# Patient Record
Sex: Female | Born: 1960 | Race: White | Hispanic: No | Smoking: Current every day smoker
Health system: Southern US, Community
[De-identification: ages and names within clinical notes are randomized; demographics above are authoritative.]

## PROBLEM LIST (undated history)

## (undated) DIAGNOSIS — F419 Anxiety disorder, unspecified: Secondary | ICD-10-CM

## (undated) DIAGNOSIS — T7840XA Allergy, unspecified, initial encounter: Secondary | ICD-10-CM

## (undated) DIAGNOSIS — M1990 Unspecified osteoarthritis, unspecified site: Secondary | ICD-10-CM

## (undated) DIAGNOSIS — N200 Calculus of kidney: Secondary | ICD-10-CM

## (undated) DIAGNOSIS — H409 Unspecified glaucoma: Secondary | ICD-10-CM

## (undated) DIAGNOSIS — F410 Panic disorder [episodic paroxysmal anxiety] without agoraphobia: Secondary | ICD-10-CM

## (undated) DIAGNOSIS — K219 Gastro-esophageal reflux disease without esophagitis: Secondary | ICD-10-CM

## (undated) DIAGNOSIS — F32A Depression, unspecified: Secondary | ICD-10-CM

## (undated) DIAGNOSIS — F329 Major depressive disorder, single episode, unspecified: Secondary | ICD-10-CM

## (undated) DIAGNOSIS — G43909 Migraine, unspecified, not intractable, without status migrainosus: Secondary | ICD-10-CM

## (undated) DIAGNOSIS — R011 Cardiac murmur, unspecified: Secondary | ICD-10-CM

## (undated) HISTORY — DX: Major depressive disorder, single episode, unspecified: F32.9

## (undated) HISTORY — DX: Gastro-esophageal reflux disease without esophagitis: K21.9

## (undated) HISTORY — DX: Allergy, unspecified, initial encounter: T78.40XA

## (undated) HISTORY — DX: Panic disorder (episodic paroxysmal anxiety): F41.0

## (undated) HISTORY — PX: FRACTURE SURGERY: SHX138

## (undated) HISTORY — DX: Unspecified glaucoma: H40.9

## (undated) HISTORY — PX: ABDOMINAL HYSTERECTOMY: SHX81

## (undated) HISTORY — DX: Depression, unspecified: F32.A

## (undated) HISTORY — DX: Unspecified osteoarthritis, unspecified site: M19.90

## (undated) HISTORY — DX: Cardiac murmur, unspecified: R01.1

## (undated) HISTORY — PX: TOTAL ABDOMINAL HYSTERECTOMY W/ BILATERAL SALPINGOOPHORECTOMY: SHX83

## (undated) HISTORY — PX: TONSILLECTOMY: SUR1361

---

## 2004-01-09 ENCOUNTER — Emergency Department: Payer: Self-pay | Admitting: Emergency Medicine

## 2004-09-21 ENCOUNTER — Emergency Department: Payer: Self-pay | Admitting: Emergency Medicine

## 2004-09-25 ENCOUNTER — Ambulatory Visit: Payer: Self-pay | Admitting: Internal Medicine

## 2004-09-26 ENCOUNTER — Ambulatory Visit: Payer: Self-pay | Admitting: Internal Medicine

## 2005-01-17 ENCOUNTER — Emergency Department: Payer: Self-pay | Admitting: Emergency Medicine

## 2005-02-20 ENCOUNTER — Emergency Department: Payer: Self-pay | Admitting: Internal Medicine

## 2005-03-02 HISTORY — PX: SEPTOPLASTY: SUR1290

## 2005-06-12 ENCOUNTER — Emergency Department: Payer: Self-pay | Admitting: Emergency Medicine

## 2005-07-21 ENCOUNTER — Ambulatory Visit: Payer: Self-pay | Admitting: Otolaryngology

## 2006-03-05 ENCOUNTER — Emergency Department: Payer: Self-pay | Admitting: Unknown Physician Specialty

## 2006-05-05 ENCOUNTER — Emergency Department: Payer: Self-pay | Admitting: Emergency Medicine

## 2006-05-09 ENCOUNTER — Emergency Department: Payer: Self-pay | Admitting: Emergency Medicine

## 2006-07-13 ENCOUNTER — Other Ambulatory Visit: Payer: Self-pay

## 2006-07-13 ENCOUNTER — Emergency Department: Payer: Self-pay | Admitting: Emergency Medicine

## 2006-07-24 ENCOUNTER — Inpatient Hospital Stay: Payer: Self-pay | Admitting: Internal Medicine

## 2006-07-24 ENCOUNTER — Other Ambulatory Visit: Payer: Self-pay

## 2006-07-29 ENCOUNTER — Emergency Department (HOSPITAL_COMMUNITY): Admission: EM | Admit: 2006-07-29 | Discharge: 2006-07-29 | Payer: Self-pay | Admitting: Family Medicine

## 2006-08-17 ENCOUNTER — Emergency Department (HOSPITAL_COMMUNITY): Admission: EM | Admit: 2006-08-17 | Discharge: 2006-08-17 | Payer: Self-pay | Admitting: Emergency Medicine

## 2006-09-04 ENCOUNTER — Emergency Department: Payer: Self-pay

## 2006-09-05 ENCOUNTER — Emergency Department (HOSPITAL_COMMUNITY): Admission: EM | Admit: 2006-09-05 | Discharge: 2006-09-06 | Payer: Self-pay | Admitting: Emergency Medicine

## 2006-10-17 ENCOUNTER — Emergency Department: Payer: Self-pay | Admitting: Emergency Medicine

## 2006-10-23 ENCOUNTER — Emergency Department: Payer: Self-pay | Admitting: Emergency Medicine

## 2006-12-10 ENCOUNTER — Emergency Department: Payer: Self-pay | Admitting: Emergency Medicine

## 2006-12-11 ENCOUNTER — Other Ambulatory Visit: Payer: Self-pay

## 2006-12-11 ENCOUNTER — Emergency Department: Payer: Self-pay

## 2006-12-12 ENCOUNTER — Emergency Department: Payer: Self-pay

## 2007-01-08 ENCOUNTER — Emergency Department (HOSPITAL_COMMUNITY): Admission: EM | Admit: 2007-01-08 | Discharge: 2007-01-08 | Payer: Self-pay | Admitting: Emergency Medicine

## 2007-02-08 ENCOUNTER — Emergency Department (HOSPITAL_COMMUNITY): Admission: EM | Admit: 2007-02-08 | Discharge: 2007-02-08 | Payer: Self-pay | Admitting: Emergency Medicine

## 2007-02-27 ENCOUNTER — Emergency Department (HOSPITAL_COMMUNITY): Admission: EM | Admit: 2007-02-27 | Discharge: 2007-02-27 | Payer: Self-pay | Admitting: Emergency Medicine

## 2007-08-31 ENCOUNTER — Emergency Department: Payer: Self-pay | Admitting: Emergency Medicine

## 2007-11-07 ENCOUNTER — Other Ambulatory Visit: Payer: Self-pay

## 2007-11-07 ENCOUNTER — Inpatient Hospital Stay: Payer: Self-pay | Admitting: Internal Medicine

## 2007-11-08 ENCOUNTER — Other Ambulatory Visit: Payer: Self-pay

## 2008-01-02 ENCOUNTER — Emergency Department: Payer: Self-pay | Admitting: Emergency Medicine

## 2008-01-09 ENCOUNTER — Emergency Department: Payer: Self-pay | Admitting: Emergency Medicine

## 2008-01-11 ENCOUNTER — Emergency Department: Payer: Self-pay | Admitting: Emergency Medicine

## 2008-01-17 ENCOUNTER — Emergency Department: Payer: Self-pay | Admitting: Emergency Medicine

## 2008-02-08 ENCOUNTER — Emergency Department: Payer: Self-pay | Admitting: Emergency Medicine

## 2008-12-06 ENCOUNTER — Emergency Department: Payer: Self-pay | Admitting: Emergency Medicine

## 2009-03-02 HISTORY — PX: JOINT REPLACEMENT: SHX530

## 2009-03-02 HISTORY — PX: REVISION TOTAL HIP ARTHROPLASTY: SHX766

## 2009-06-19 ENCOUNTER — Emergency Department: Payer: Self-pay | Admitting: Emergency Medicine

## 2010-03-01 ENCOUNTER — Inpatient Hospital Stay: Payer: Self-pay | Admitting: Specialist

## 2010-10-24 ENCOUNTER — Emergency Department: Payer: Self-pay | Admitting: Emergency Medicine

## 2012-01-09 ENCOUNTER — Emergency Department: Payer: Self-pay | Admitting: Emergency Medicine

## 2012-04-24 ENCOUNTER — Emergency Department: Payer: Self-pay | Admitting: Emergency Medicine

## 2012-09-01 ENCOUNTER — Emergency Department: Payer: Self-pay

## 2012-12-03 ENCOUNTER — Emergency Department: Payer: Self-pay | Admitting: Emergency Medicine

## 2013-05-27 ENCOUNTER — Emergency Department: Payer: Self-pay | Admitting: Emergency Medicine

## 2015-02-04 ENCOUNTER — Emergency Department
Admission: EM | Admit: 2015-02-04 | Discharge: 2015-02-05 | Disposition: A | Discharge status: Home / Self Care | Payer: Self-pay | Attending: Emergency Medicine | Admitting: Emergency Medicine

## 2015-02-04 DIAGNOSIS — N200 Calculus of kidney: Secondary | ICD-10-CM | POA: Insufficient documentation

## 2015-02-04 MED ORDER — SODIUM CHLORIDE 0.9 % IV BOLUS (SEPSIS)
1000.0000 mL | Freq: Once | INTRAVENOUS | Status: AC
  Administered 2015-02-04: 1000 mL via INTRAVENOUS

## 2015-02-04 MED ORDER — ONDANSETRON HCL 4 MG/2ML IJ SOLN
4.0000 mg | Freq: Once | INTRAMUSCULAR | Status: AC
  Administered 2015-02-04: 4 mg via INTRAVENOUS

## 2015-02-04 MED ORDER — ONDANSETRON HCL 4 MG/2ML IJ SOLN
INTRAMUSCULAR | Status: DC
Start: 2015-02-04 — End: 2015-02-05
  Filled 2015-02-04: qty 2

## 2015-02-04 MED ORDER — MORPHINE SULFATE (PF) 4 MG/ML IV SOLN
4.0000 mg | Freq: Once | INTRAVENOUS | Status: AC
  Administered 2015-02-04: 4 mg via INTRAVENOUS

## 2015-02-04 MED ORDER — MORPHINE SULFATE (PF) 4 MG/ML IV SOLN
INTRAVENOUS | Status: AC
  Filled 2015-02-04: qty 1

## 2015-02-04 NOTE — ED Provider Notes (Signed)
[facility] Emergency Department Provider Note  ____________________________________________  Time seen: Approximately 11:42 PM  I have reviewed the triage vital signs and the nursing notes.   HISTORY  Chief Complaint Abdominal Pain    HPI Karen Foster is a 54 y.o. female who comes into the hospital complaining that she is dying. The patient reports that she's having right lower quadrant abdominal pain is radiating to her back. The patient reports that started 2 hours prior to her arrival sore approximately 9:00 PM. The patient reports that she's having vomiting and nausea. The patient denies any blood in her urine or pain when she urinates but reports that she is urinating every 2 minutes. The patient rates her pain as a 12 out of 10 in intensity. She reports that she's never had this kind of pain before and feels as though she is been a passed out. The patient denies any chest pain or shortness of breath but she is hyperventilating from pain. The patient reports that she is having pain in her back and she is unsure what going on. Patient is here today for evaluation.   History reviewed. No pertinent past medical history.  There are no active problems to display for this patient.   No past surgical history on file.  Current Outpatient Rx  Name  Route  Sig  Dispense  Refill  . cephALEXin (KEFLEX) 500 MG capsule   Oral   Take 1 capsule (500 mg total) by mouth 4 (four) times daily.   20 capsule   0   . ondansetron (ZOFRAN ODT) 4 MG disintegrating tablet   Oral   Take 1 tablet (4 mg total) by mouth every 8 (eight) hours as needed for nausea or vomiting.   20 tablet   0   . oxyCODONE-acetaminophen (ROXICET) 5-325 MG tablet   Oral   Take 1 tablet by mouth every 6 (six) hours as needed.   12 tablet   0   . tamsulosin (FLOMAX) 0.4 MG CAPS capsule   Oral   Take 1 capsule (0.4 mg total) by mouth daily.   7 capsule   0     Allergies Ibuprofen and  Sulfa antibiotics  No family history on file.  Social History Social History  Substance Use Topics  . Smoking status: None  . Smokeless tobacco: None  . Alcohol Use: None    Review of Systems Constitutional: No fever/chills Eyes: No visual changes. ENT: No sore throat. Cardiovascular: Denies chest pain. Respiratory: Denies shortness of breath. Gastrointestinal:  abdominal pain.   nausea,  vomiting.  No diarrhea.  No constipation. Genitourinary: Negative for dysuria. Musculoskeletal:  back pain. Skin: Negative for rash. Neurological: Negative for headaches, focal weakness or numbness.  10-point ROS otherwise negative.  ____________________________________________   PHYSICAL EXAM:  VITAL SIGNS: ED Triage Vitals  Enc Vitals Group     BP 02/04/15 2323 166/91 mmHg     Pulse Rate 02/04/15 2323 76     Resp 02/04/15 2323 20     Temp 02/04/15 2323 98 F (36.7 C)     Temp Source 02/04/15 2323 Oral     SpO2 02/04/15 2323 96 %     Weight 02/04/15 2323 104 lb (47.174 kg)     Height 02/04/15 2323  (1.651 m)     Head Cir --      Peak Flow --      Pain Score --      Pain Loc --  Pain Edu? --      Excl. in GC? --     Constitutional: Alert and oriented. Well appearing and in moderate to severe distress. Eyes: Conjunctivae are normal. PERRL. EOMI. Head: Atraumatic. Nose: No congestion/rhinnorhea. Mouth/Throat: Mucous membranes are moist.  Oropharynx non-erythematous. Cardiovascular: Normal rate, regular rhythm. Grossly normal heart sounds.  Good peripheral circulation. Respiratory: Normal respiratory effort.  No retractions. Lungs CTAB. Gastrointestinal: Soft with right sided and right lower quadrant tenderness to palpation No distention. Positive bowel sounds with right CVA tenderness. Musculoskeletal: No lower extremity tenderness nor edema.   Neurologic:  Normal speech and language.  Skin:  Skin is warm, dry and intact.  Psychiatric: Mood and affect are normal.    ____________________________________________   LABS (all labs ordered are listed, but only abnormal results are displayed)  Labs Reviewed  COMPREHENSIVE METABOLIC PANEL - Abnormal; Notable for the following:    Potassium 3.4 (*)    Glucose, Bld 118 (*)    AST 14 (*)    ALT 7 (*)    Total Bilirubin 0.2 (*)    All other components within normal limits  CBC - Abnormal; Notable for the following:    WBC 12.1 (*)    MCV 104.0 (*)    MCH 34.1 (*)    All other components within normal limits  URINALYSIS COMPLETEWITH MICROSCOPIC ([facility] ONLY) - Abnormal; Notable for the following:    Color, Urine AMBER (*)    APPearance CLEAR (*)    Nitrite POSITIVE (*)    Bacteria, UA RARE (*)    Squamous Epithelial / LPF 0-5 (*)    All other components within normal limits  URINE CULTURE  LIPASE, BLOOD   ____________________________________________  EKG  None ____________________________________________  RADIOLOGY  CT abdomen and pelvis: Mild right-sided hydronephrosis with right sided perinephric stranding and fluid. Obstructing 3 mm stone noted distally at the right vesicoureteral junction. Slight prominence of common bile duct measuring up to 7 mm in diameter gallbladder is unremarkable and experience. Mild anterior right basilar airspace opacity mainly left atelectasis or scarring. ____________________________________________   PROCEDURES  Procedure(s) performed: None  Critical Care performed: No  ____________________________________________   INITIAL IMPRESSION / ASSESSMENT AND PLAN / ED COURSE  Pertinent labs & imaging results that were available during my care of the patient were reviewed by me and considered in my medical decision making (see chart for details).  This is a 54 year old female who comes in today with some right-sided pain radiating to her back. We will check some urine and a urinalysis. Once I obtained the patient's urinalysis I will determine if the patient  needs a noncontrasted renal stone protocol CT versus a contrasted CT to evaluate her appendix versus a kidney stone. The patient did receive a dose of morphine as well as normal saline and Zofran.  Appears as though the patient has a kidney stone. I informed the patient and she was feeling some improvement after the medication. I gave the patient 2 Percocet and we'll discharge her to home. The patient's urinalysis does not show any white blood cells or bacteria but she does have some positive nitrates. It is possible that there may be some contamination but I will discharge the patient home with some Keflex as well and have her follow-up with  urology. ____________________________________________   FINAL CLINICAL IMPRESSION(S) / ED DIAGNOSES  Final diagnoses:  Kidney stone      Rebecka ApleyAllison P Greenleigh Kauth, MD 02/05/15 [phone]

## 2015-02-04 NOTE — ED Notes (Signed)
According to EMS, pt has been c/o RLQ abd pain for approx. 1hr, n/v, and increased urinary frequency. [facility] A[facility]OX[facility], speaking in complete sentences, asking to use the commode.   EMS Vitals  BP 157/74 SPO2 99% RA

## 2015-02-05 ENCOUNTER — Emergency Department: Payer: Self-pay

## 2015-02-05 LAB — URINALYSIS COMPLETE WITH MICROSCOPIC (ARMC ONLY)
Bilirubin Urine: NEGATIVE
Glucose, UA: NEGATIVE mg/dL
Hgb urine dipstick: NEGATIVE
Ketones, ur: NEGATIVE mg/dL
Leukocytes, UA: NEGATIVE
Nitrite: POSITIVE — AB
Protein, ur: NEGATIVE mg/dL
Specific Gravity, Urine: 1.025 (ref 1.005–1.030)
pH: 5 (ref 5.0–8.0)

## 2015-02-05 LAB — COMPREHENSIVE METABOLIC PANEL
ALT: 7 U/L — ABNORMAL LOW (ref 14–54)
AST: 14 U/L — ABNORMAL LOW (ref 15–41)
Albumin: 4.3 g/dL (ref 3.5–5.0)
Alkaline Phosphatase: 82 U/L (ref 38–126)
Anion gap: 6 (ref 5–15)
BUN: 18 mg/dL (ref 6–20)
CO2: 26 mmol/L (ref 22–32)
Calcium: 9.2 mg/dL (ref 8.9–10.3)
Chloride: 111 mmol/L (ref 101–111)
Creatinine, Ser: 0.76 mg/dL (ref 0.44–1.00)
GFR calc Af Amer: >60 mL/min (ref >60)
GFR calc non Af Amer: >60 mL/min (ref >60)
Glucose, Bld: 118 mg/dL — ABNORMAL HIGH (ref 65–99)
Potassium: 3.4 mmol/L — ABNORMAL LOW (ref 3.5–5.1)
Sodium: 143 mmol/L (ref 135–145)
Total Bilirubin: 0.2 mg/dL — ABNORMAL LOW (ref 0.3–1.2)
Total Protein: 8 g/dL (ref 6.5–8.1)

## 2015-02-05 LAB — CBC
HCT: 41 % (ref 35.0–47.0)
Hemoglobin: 13.5 g/dL (ref 12.0–16.0)
MCH: 34.1 pg — ABNORMAL HIGH (ref 26.0–34.0)
MCHC: 32.8 g/dL (ref 32.0–36.0)
MCV: 104 fL — ABNORMAL HIGH (ref 80.0–100.0)
Platelets: 154 10*3/uL (ref 150–440)
RBC: 3.94 MIL/uL (ref 3.80–5.20)
RDW: 12.5 % (ref 11.5–14.5)
WBC: 12.1 10*3/uL — ABNORMAL HIGH (ref 3.6–11.0)

## 2015-02-05 LAB — LIPASE, BLOOD: Lipase: 26 U/L (ref 11–51)

## 2015-02-05 MED ORDER — MORPHINE SULFATE (PF) 4 MG/ML IV SOLN
4.0000 mg | Freq: Once | INTRAVENOUS | Status: AC
  Administered 2015-02-05: 4 mg via INTRAVENOUS
  Filled 2015-02-05: qty 1

## 2015-02-05 MED ORDER — IOHEXOL 300 MG/ML  SOLN
100.0000 mL | Freq: Once | INTRAMUSCULAR | Status: AC | PRN
  Administered 2015-02-05: 100 mL via INTRAVENOUS

## 2015-02-05 MED ORDER — OXYCODONE-ACETAMINOPHEN 5-325 MG PO TABS
2.0000 | ORAL_TABLET | Freq: Once | ORAL | Status: AC
  Administered 2015-02-05: 2 via ORAL
  Filled 2015-02-05: qty 2

## 2015-02-05 MED ORDER — IOHEXOL 240 MG/ML SOLN
25.0000 mL | INTRAMUSCULAR | Status: AC
  Administered 2015-02-05: 25 mL via ORAL

## 2015-02-05 MED ORDER — CEPHALEXIN 500 MG PO CAPS: 500.0000 mg | ORAL_CAPSULE | Freq: Four times a day (QID) | ORAL | Status: AC

## 2015-02-05 MED ORDER — ONDANSETRON 4 MG PO TBDP: 4.0000 mg | ORAL_TABLET | Freq: Three times a day (TID) | ORAL | Status: DC | PRN

## 2015-02-05 MED ORDER — OXYCODONE-ACETAMINOPHEN 5-325 MG PO TABS: 1.0000 | ORAL_TABLET | Freq: Four times a day (QID) | ORAL | Status: DC | PRN

## 2015-02-05 MED ORDER — TAMSULOSIN HCL 0.4 MG PO CAPS: 0.4000 mg | ORAL_CAPSULE | Freq: Every day | ORAL | Status: DC

## 2015-02-05 NOTE — ED Notes (Signed)
Pt dc home ambulatory pain unchanged instructed on follow up plan and med use PT NAD AT DC 

## 2015-02-05 NOTE — Discharge Instructions (Signed)
Kidney Stones °Kidney stones (urolithiasis) are deposits that form inside your kidneys. The intense pain is caused by the stone moving through the urinary tract. When the stone moves, the ureter goes into spasm around the stone. The stone is usually passed in the urine.  °CAUSES  °· A disorder that makes certain neck glands produce too much parathyroid hormone (primary hyperparathyroidism). °· A buildup of uric acid crystals, similar to gout in your joints. °· Narrowing (stricture) of the ureter. °· A kidney obstruction present at birth (congenital obstruction). °· Previous surgery on the kidney or ureters. °· Numerous kidney infections. °SYMPTOMS  °· Feeling sick to your stomach (nauseous). °· Throwing up (vomiting). °· Blood in the urine (hematuria). °· Pain that usually spreads (radiates) to the groin. °· Frequency or urgency of urination. °DIAGNOSIS  °· Taking a history and physical exam. °· Blood or urine tests. °· CT scan. °· Occasionally, an examination of the inside of the urinary bladder (cystoscopy) is performed. °TREATMENT  °· Observation. °· Increasing your fluid intake. °· Extracorporeal shock wave lithotripsy--This is a noninvasive procedure that uses shock waves to break up kidney stones. °· Surgery may be needed if you have severe pain or persistent obstruction. There are various surgical procedures. Most of the procedures are performed with the use of small instruments. Only small incisions are needed to accommodate these instruments, so recovery time is minimized. °The size, location, and chemical composition are all important variables that will determine the proper choice of action for you. Talk to your health care provider to better understand your situation so that you will minimize the risk of injury to yourself and your kidney.  °HOME CARE INSTRUCTIONS  °· Drink enough water and fluids to keep your urine clear or pale yellow. This will help you to pass the stone or stone fragments. °· Strain  all urine through the provided strainer. Keep all particulate matter and stones for your health care provider to see. The stone causing the pain may be as small as a grain of salt. It is very important to use the strainer each and every time you pass your urine. The collection of your stone will allow your health care provider to analyze it and verify that a stone has actually passed. The stone analysis will often identify what you can do to reduce the incidence of recurrences. °· Only take over-the-counter or prescription medicines for pain, discomfort, or fever as directed by your health care provider. °· Keep all follow-up visits as told by your health care provider. This is important. °· Get follow-up X-rays if required. The absence of pain does not always mean that the stone has passed. It may have only stopped moving. If the urine remains completely obstructed, it can cause loss of kidney function or even complete destruction of the kidney. It is your responsibility to make sure X-rays and follow-ups are completed. Ultrasounds of the kidney can show blockages and the status of the kidney. Ultrasounds are not associated with any radiation and can be performed easily in a matter of minutes. °· Make changes to your daily diet as told by your health care provider. You may be told to: °¨ Limit the amount of salt that you eat. °¨ Eat 5 or more servings of fruits and vegetables each day. °¨ Limit the amount of meat, poultry, fish, and eggs that you eat. °· Collect a 24-hour urine sample as told by your health care provider. You may need to collect another urine sample every 6-12   months. °SEEK MEDICAL CARE IF: °· You experience pain that is progressive and unresponsive to any pain medicine you have been prescribed. °SEEK IMMEDIATE MEDICAL CARE IF:  °· Pain cannot be controlled with the prescribed medicine. °· You have a fever or shaking chills. °· The severity or intensity of pain increases over 18 hours and is not  relieved by pain medicine. °· You develop a new onset of abdominal pain. °· You feel faint or pass out. °· You are unable to urinate. °  °This information is not intended to replace advice given to you by your health care provider. Make sure you discuss any questions you have with your health care provider. °  °Document Released: 02/16/2005 Document Revised: 11/07/2014 Document Reviewed: 07/20/2012 °Elsevier Interactive Patient Education ©2016 [employer] ° °

## 2015-02-06 LAB — URINE CULTURE
Culture: NO GROWTH
Special Requests: NORMAL

## 2015-02-07 ENCOUNTER — Telehealth: Payer: Self-pay | Admitting: Emergency Medicine

## 2016-02-10 ENCOUNTER — Encounter: Payer: Self-pay | Admitting: Emergency Medicine

## 2016-02-10 ENCOUNTER — Emergency Department: Payer: Self-pay

## 2016-02-10 ENCOUNTER — Emergency Department
Admission: EM | Admit: 2016-02-10 | Discharge: 2016-02-10 | Disposition: A | Discharge status: Home / Self Care | Payer: Self-pay | Attending: Emergency Medicine | Admitting: Emergency Medicine

## 2016-02-10 DIAGNOSIS — F17200 Nicotine dependence, unspecified, uncomplicated: Secondary | ICD-10-CM | POA: Insufficient documentation

## 2016-02-10 DIAGNOSIS — R109 Unspecified abdominal pain: Secondary | ICD-10-CM | POA: Insufficient documentation

## 2016-02-10 HISTORY — DX: Calculus of kidney: N20.0

## 2016-02-10 HISTORY — DX: Anxiety disorder, unspecified: F41.9

## 2016-02-10 HISTORY — DX: Migraine, unspecified, not intractable, without status migrainosus: G43.909

## 2016-02-10 LAB — URINALYSIS, COMPLETE (UACMP) WITH MICROSCOPIC
Bacteria, UA: NONE SEEN
Bilirubin Urine: NEGATIVE
Glucose, UA: NEGATIVE mg/dL
Hgb urine dipstick: NEGATIVE
Ketones, ur: NEGATIVE mg/dL
Nitrite: NEGATIVE
Protein, ur: NEGATIVE mg/dL
Specific Gravity, Urine: 1.013 (ref 1.005–1.030)
pH: 5 (ref 5.0–8.0)

## 2016-02-10 LAB — CBC
HCT: 41.2 % (ref 35.0–47.0)
Hemoglobin: 14 g/dL (ref 12.0–16.0)
MCH: 35.1 pg — ABNORMAL HIGH (ref 26.0–34.0)
MCHC: 34 g/dL (ref 32.0–36.0)
MCV: 103.2 fL — ABNORMAL HIGH (ref 80.0–100.0)
Platelets: 152 10*3/uL (ref 150–440)
RBC: 4 MIL/uL (ref 3.80–5.20)
RDW: 13.3 % (ref 11.5–14.5)
WBC: 5.9 10*3/uL (ref 3.6–11.0)

## 2016-02-10 LAB — BASIC METABOLIC PANEL
Anion gap: 9 (ref 5–15)
BUN: 21 mg/dL — ABNORMAL HIGH (ref 6–20)
CO2: 23 mmol/L (ref 22–32)
Calcium: 9.1 mg/dL (ref 8.9–10.3)
Chloride: 108 mmol/L (ref 101–111)
Creatinine, Ser: 0.96 mg/dL (ref 0.44–1.00)
GFR calc Af Amer: >60 mL/min (ref >60)
GFR calc non Af Amer: >60 mL/min (ref >60)
Glucose, Bld: 107 mg/dL — ABNORMAL HIGH (ref 65–99)
Potassium: 4.2 mmol/L (ref 3.5–5.1)
Sodium: 140 mmol/L (ref 135–145)

## 2016-02-10 MED ORDER — OXYCODONE-ACETAMINOPHEN 5-325 MG PO TABS: 1.0000 | ORAL_TABLET | ORAL | Status: DC | PRN

## 2016-02-10 MED ORDER — OXYCODONE-ACETAMINOPHEN 5-325 MG PO TABS
1.0000 | ORAL_TABLET | Freq: Once | ORAL | Status: AC
  Administered 2016-02-10: 1 via ORAL
  Filled 2016-02-10: qty 1

## 2016-02-10 MED ORDER — OXYCODONE-ACETAMINOPHEN 5-325 MG PO TABS: 1.0000 | ORAL_TABLET | Freq: Four times a day (QID) | ORAL | 0 refills | Status: DC | PRN

## 2016-02-10 MED ORDER — OXYCODONE-ACETAMINOPHEN 5-325 MG PO TABS: 1.0000 | ORAL_TABLET | Freq: Four times a day (QID) | ORAL | 0 refills | Status: AC | PRN

## 2016-02-10 MED ORDER — ONDANSETRON HCL 4 MG PO TABS: 4.0000 mg | ORAL_TABLET | Freq: Three times a day (TID) | ORAL | 0 refills | Status: DC | PRN

## 2016-02-10 MED ORDER — ONDANSETRON 4 MG PO TBDP: 4.0000 mg | ORAL_TABLET | Freq: Once | ORAL | Status: DC | PRN

## 2016-02-10 MED ORDER — ONDANSETRON 4 MG PO TBDP
4.0000 mg | ORAL_TABLET | Freq: Once | ORAL | Status: AC
  Administered 2016-02-10: 4 mg via ORAL
  Filled 2016-02-10: qty 1

## 2016-02-10 NOTE — ED Triage Notes (Signed)
Pt c/o right flank pain since last night, feels like kidney stone. Has had vomiting and fever 101 per pt. Last urination was 3 hrs ago. No distress at this time. Skin warm and dry. Not vomiting at this time.

## 2016-02-10 NOTE — ED Notes (Signed)
Patient transported to Ultrasound 

## 2016-02-10 NOTE — ED Provider Notes (Signed)
[facility] Emergency Department Provider Note   ____________________________________________   I have reviewed the triage vital signs and the nursing notes.   HISTORY  Chief Complaint Flank Pain   History limited by: Not Limited   HPI Karna DupesLori A Wherry is a 55 y.o. female who presents to the emergency department today because of concern for right flank pain. The patient states that it started last night. Has been fairly continuous since then. Some radiation towards the groin. Has been accompanied by nausea and vomiting. The patient did see some blood in her urine last night. She denies any bad odor to her urine. No change in defecation. States she did have fever last night. This pain feels the exact same as the pain she had last year when she was diagnosed with a kidney stone.    Past Medical History:  Diagnosis Date  . Anxiety   . Kidney stones   . Migraines     There are no active problems to display for this patient.   Past Surgical History:  Procedure Laterality Date  . ABDOMINAL HYSTERECTOMY    . TONSILLECTOMY      Prior to Admission medications   Medication Sig Start Date End Date Taking? Authorizing Provider  ondansetron (ZOFRAN ODT) 4 MG disintegrating tablet Take 1 tablet (4 mg total) by mouth every 8 (eight) hours as needed for nausea or vomiting. 02/05/15   Rebecka ApleyAllison P Webster, MD  oxyCODONE-acetaminophen (ROXICET) 5-325 MG tablet Take 1 tablet by mouth every 6 (six) hours as needed. 02/05/15   Rebecka ApleyAllison P Webster, MD  tamsulosin (FLOMAX) 0.4 MG CAPS capsule Take 1 capsule (0.4 mg total) by mouth daily. 02/05/15   Rebecka ApleyAllison P Webster, MD    Allergies Ibuprofen and Sulfa antibiotics  History reviewed. No pertinent family history.  Social History Social History  Substance Use Topics  . Smoking status: Current Every Day Smoker  . Smokeless tobacco: Never Used  . Alcohol use No    Review of Systems  Constitutional: Negative for  fever. Cardiovascular: Negative for chest pain. Respiratory: Negative for shortness of breath. Gastrointestinal: Positive for right flank pain. Genitourinary: Negative for dysuria. Musculoskeletal: Negative for back pain. Skin: Negative for rash. Neurological: Negative for headaches, focal weakness or numbness.  10-point ROS otherwise negative.  ____________________________________________   PHYSICAL EXAM:  VITAL SIGNS: ED Triage Vitals  Enc Vitals Group     BP 02/10/16 1450 129/75     Pulse Rate 02/10/16 1447 (!) 114     Resp 02/10/16 1447 16     Temp 02/10/16 1447 98.1 F (36.7 C)     Temp Source 02/10/16 1447 Oral     SpO2 02/10/16 1447 96 %     Weight 02/10/16 1449 95 lb (43.1 kg)     Height 02/10/16 1449 5\' 4"  (1.626 m)     Head Circumference --      Peak Flow --      Pain Score 02/10/16 1449 10   Constitutional: Alert and oriented. Well appearing and in no distress. Eyes: Conjunctivae are normal. Normal extraocular movements. ENT   Head: Normocephalic and atraumatic.   Nose: No congestion/rhinnorhea.   Mouth/Throat: Mucous membranes are moist.   Neck: No stridor. Hematological/Lymphatic/Immunilogical: No cervical lymphadenopathy. Cardiovascular: Normal rate, regular rhythm.  No murmurs, rubs, or gallops.  Respiratory: Normal respiratory effort without tachypnea nor retractions. Breath sounds are clear and equal bilaterally. No wheezes/rales/rhonchi. Gastrointestinal: Soft and nontender. No distention.  Genitourinary: Deferred Musculoskeletal: Normal range of motion  in all extremities. No lower extremity edema. Neurologic:  Normal speech and language. No gross focal neurologic deficits are appreciated.  Skin:  Skin is warm, dry and intact. No rash noted. Psychiatric: Mood and affect are normal. Speech and behavior are normal. Patient exhibits appropriate insight and judgment.  ____________________________________________    LABS (pertinent  positives/negatives)  Labs Reviewed  URINALYSIS, COMPLETE (UACMP) WITH MICROSCOPIC - Abnormal; Notable for the following:       Result Value   Color, Urine YELLOW (*)    APPearance CLEAR (*)    Leukocytes, UA TRACE (*)    Squamous Epithelial / LPF 0-5 (*)    All other components within normal limits  CBC - Abnormal; Notable for the following:    MCV 103.2 (*)    MCH 35.1 (*)    All other components within normal limits  BASIC METABOLIC PANEL - Abnormal; Notable for the following:    Glucose, Bld 107 (*)    BUN 21 (*)    All other components within normal limits     ____________________________________________   EKG  None  ____________________________________________    RADIOLOGY  US renal IMPRESSION:  1. No hydronephrosis. No renal mass.  2. Unremarkable urinary bladder. Bilateral ureteral jets are  visualized.    ____________________________________________   PROCEDURES  Procedures  ____________________________________________   INITIAL IMPRESSION / ASSESSMENT AND PLAN / ED COURSE  Pertinent labs & imaging results that were available during my care of the patient were reviewed by me and considered in my medical decision making (see chart for details).  Patient presented to the emergency department today with with concerns for right flank pain. Patient states didn't remind her of her previous episode of kidney stones. Patient's workup without any concerning findings. No leukocytosis. Urine without signs of infection. No elevated creatinine. I did discuss with the patient. At this point think possible kidney stone related. And will treat as such. I did however discuss with her possibility of appendicitis. She insists that the pain today does remind her of her kidney stones. She is comfortable treating for kidney stones. Discussed appendicitis return cautions. ____________________________________________   FINAL CLINICAL IMPRESSION(S) / ED DIAGNOSES  Final  diagnoses:  Right flank pain     Note: This dictation was prepared with Dragon dictation. Any transcriptional errors that result from this process are unintentional    Phineas SemenGraydon Jeral Zick, MD 02/10/16 2105

## 2016-02-10 NOTE — Discharge Instructions (Signed)
Please seek medical attention for any high fevers, chest pain, shortness of breath, change in behavior, persistent vomiting, bloody stool or any other new or concerning symptoms.  

## 2016-02-10 NOTE — ED Notes (Signed)
Gave pt warm blanket. 

## 2016-02-19 ENCOUNTER — Encounter: Payer: Self-pay | Admitting: Urology

## 2016-02-19 ENCOUNTER — Ambulatory Visit (INDEPENDENT_AMBULATORY_CARE_PROVIDER_SITE_OTHER): Payer: Self-pay | Admitting: Urology

## 2016-02-19 VITALS — BP 139/84 | HR 118 | Ht 63.0 in | Wt 118.5 lb

## 2016-02-19 DIAGNOSIS — M25551 Pain in right hip: Secondary | ICD-10-CM

## 2016-02-19 DIAGNOSIS — Z87442 Personal history of urinary calculi: Secondary | ICD-10-CM

## 2016-02-19 DIAGNOSIS — N200 Calculus of kidney: Secondary | ICD-10-CM

## 2016-02-19 DIAGNOSIS — R3911 Hesitancy of micturition: Secondary | ICD-10-CM

## 2016-02-19 LAB — URINALYSIS, COMPLETE
Bilirubin, UA: NEGATIVE
Glucose, UA: NEGATIVE
Ketones, UA: NEGATIVE
Nitrite, UA: NEGATIVE
Protein, UA: NEGATIVE
RBC, UA: NEGATIVE
Specific Gravity, UA: 1.02 (ref 1.005–1.030)
Urobilinogen, Ur: 0.2 mg/dL (ref 0.2–1.0)
pH, UA: 5.5 (ref 5.0–7.5)

## 2016-02-19 LAB — BLADDER SCAN AMB NON-IMAGING: Scan Result: 73

## 2016-02-19 LAB — MICROSCOPIC EXAMINATION
Epithelial Cells (non renal): >10 /hpf — AB (ref 0–10)
RBC, UA: NONE SEEN /hpf (ref 0–?)

## 2016-02-19 NOTE — Progress Notes (Signed)
02/19/2016 4:38 PM   Karen Foster June 09, 1960 [id number]  Referring provider: No referring provider defined for this encounter.  Chief Complaint  Patient presents with  . New Patient (Initial Visit)    Kidney stones referred by ER    HPI: Patient is 55 year old Caucasian female who is referred to [place] by the [facility] ED for possible kidney stones.    She was having pain in the right lower back into the right hip.  She stated that the pain started two days ago.  She has been having associated nausea, hesitant and urgency.  She states the pain is currently at an 8/10.  Patient was seated comfortably reading a book and drinking a Pepsi when I entered the exam room.  She has not seen any blood in the urine.  She was given a strainer in the ED and she has not passed any fragments.  She has been having low grade fevers, intermittent chills and nausea.   Pressure makes the pain better.  Laying flat makes the pain worse.    Patient is having urgency, dysuria, nocturia, incontinence, intermittency, hesitant, straining to urinate and weak urinary stream.  These also started two days ago.  Her UA in the ED on 02/10/2016 noted 0-5 RBC's and 0-5 WBC's.  Her WBC count was 5.9.  Her serum creatinine is 0.96.  Her UA today noted 6-10 WBC's.  Her PVR was 73 mL.    She has a history of stones.  Her stone composition was unknown.    Contrast CT demonstrated Mild right-sided hydronephrosis, with right-sided perinephric stranding and fluid, and fluid tracking along the course of the right ureter. Obstructing 3 mm stone noted distally at the right vesicoureteral junction.  Slight prominence of the common bile duct, measuring up to 7 mm in diameter, with slight prominence of the intrahepatic biliary ducts. Mild nonspecific edema noted about the hepatic hilum. Would correlate for any associated symptoms. The gallbladder is unremarkable in appearance.  Mild anterior right basilar airspace opacity may reflect  atelectasis or scarring.  Scattered calcification along the abdominal aorta and its branches.  Scattered small left renal cysts noted.  RUS performed on 02/10/2016 demonstrated no hydronephrosis.  No renal mass.  Unremarkable urinary bladder. Bilateral ureteral jets are visualized.  I have independently reviewed the films.    She is only drinking 3 bottles of water daily.  She drinks 3 to 4 Pepsi's daily.     PMH: Past Medical History:  Diagnosis Date  . Anxiety   . Kidney stones   . Migraines     Surgical History: Past Surgical History:  Procedure Laterality Date  . ABDOMINAL HYSTERECTOMY    . REVISION TOTAL HIP ARTHROPLASTY  2011  . SEPTOPLASTY  2007  . TONSILLECTOMY      Home Medications:  Allergies as of 02/19/2016      Reactions   Ibuprofen Anaphylaxis   Sulfa Antibiotics Other (See Comments)      Medication List       Accurate as of 02/19/16  4:38 PM. Always use your most recent med list.          ondansetron 4 MG disintegrating tablet Commonly known as:  ZOFRAN ODT Take 1 tablet (4 mg total) by mouth every 8 (eight) hours as needed for nausea or vomiting.   ondansetron 4 MG tablet Commonly known as:  ZOFRAN Take 1 tablet (4 mg total) by mouth every 8 (eight) hours as needed for nausea or  vomiting.   oxyCODONE-acetaminophen 5-325 MG tablet Commonly known as:  ROXICET Take 1 tablet by mouth every 6 (six) hours as needed.   oxyCODONE-acetaminophen 5-325 MG tablet Commonly known as:  ROXICET Take 1 tablet by mouth every 6 (six) hours as needed.   promethazine 12.5 MG tablet Commonly known as:  PHENERGAN Take 12.5 mg by mouth every 6 (six) hours as needed for nausea or vomiting.   tamsulosin 0.4 MG Caps capsule Commonly known as:  FLOMAX Take 1 capsule (0.4 mg total) by mouth daily.       Allergies:  Allergies  Allergen Reactions  . Ibuprofen Anaphylaxis  . Sulfa Antibiotics Other (See Comments)    Family History: Family History  Problem  Relation Age of Onset  . Kidney failure Mother   . Heart attack Mother   . Kidney failure Maternal Grandmother   . Prostate cancer Neg Hx   . Bladder Cancer Neg Hx     Social History:  reports that she has been smoking.  She has never used smokeless tobacco. She reports that she does not drink alcohol or use drugs.  ROS: UROLOGY Frequent Urination?: No Hard to postpone urination?: Yes Burning/pain with urination?: Yes Get up at night to urinate?: Yes Leakage of urine?: Yes Urine stream starts and stops?: Yes Trouble starting stream?: Yes Do you have to strain to urinate?: Yes Blood in urine?: No Urinary tract infection?: No Sexually transmitted disease?: No Injury to kidneys or bladder?: No Painful intercourse?: No Weak stream?: Yes Currently pregnant?: No Vaginal bleeding?: No Last menstrual period?: n  Gastrointestinal Nausea?: Yes Vomiting?: Yes Indigestion/heartburn?: Yes Diarrhea?: No Constipation?: No  Constitutional Fever: No Night sweats?: No Weight loss?: No Fatigue?: No  Skin Skin rash/lesions?: No Itching?: No  Eyes Blurred vision?: No Double vision?: No  Ears/Nose/Throat Sore throat?: No Sinus problems?: No  Hematologic/Lymphatic Swollen glands?: No Easy bruising?: No  Cardiovascular Leg swelling?: No Chest pain?: No  Respiratory Cough?: No Shortness of breath?: No  Endocrine Excessive thirst?: No  Musculoskeletal Back pain?: No Joint pain?: No  Neurological Headaches?: No Dizziness?: No  Psychologic Depression?: Yes Anxiety?: Yes  Physical Exam: BP 139/84   Pulse (!) 118   Ht 5\' 3"  (1.6 m)   Wt 118 lb 8 oz (53.8 kg)   BMI 20.99 kg/m   Constitutional: Well nourished. Alert and oriented, No acute distress. HEENT: [place] AT, moist mucus membranes. Trachea midline, no masses. Cardiovascular: No clubbing, cyanosis, or edema. Respiratory: Normal respiratory effort, no increased work of breathing. GI: Abdomen is soft,  non tender, non distended, no abdominal masses. Liver and spleen not palpable.  No hernias appreciated.  Stool sample for occult testing is not indicated.   GU: No CVA tenderness.  No bladder fullness or masses.   Skin: No rashes, bruises or suspicious lesions. Lymph: No cervical or inguinal adenopathy. Neurologic: Grossly intact, no focal deficits, moving all 4 extremities. Psychiatric: Normal mood and affect.  Laboratory Data: Lab Results  Component Value Date   WBC 5.9 02/10/2016   HGB 14.0 02/10/2016   HCT 41.2 02/10/2016   MCV 103.2 (H) 02/10/2016   PLT 152 02/10/2016    Lab Results  Component Value Date   CREATININE 0.96 02/10/2016    Lab Results  Component Value Date   AST 14 (L) 02/04/2015   Lab Results  Component Value Date   ALT 7 (L) 02/04/2015    Urinalysis 6-10 WBC's.  See EPIC.   Pertinent Imaging: CLINICAL DATA:  Right  flank pain for 1 day, history of kidney stones CT scan 02/05/2015  EXAM: RENAL / URINARY TRACT ULTRASOUND COMPLETE  COMPARISON:  None.  FINDINGS: Right Kidney:  Length: 10.8 cm. Echogenicity within normal limits. No mass or hydronephrosis visualized.  Left Kidney:  Length: 10.2 cm. Echogenicity within normal limits. No mass or hydronephrosis visualized.  Bladder:  Appears normal for degree of bladder distention. Bilateral ureteral jets are noted.  IMPRESSION: 1. No hydronephrosis.  No renal mass. 2. Unremarkable urinary bladder. Bilateral ureteral jets are visualized.   Electronically Signed   By: Natasha MeadLiviu  Pop M.D.   On: 02/10/2016 17:28   Assessment & Plan:    1. Right hip pain  - patient states that this pain is exactly like how her small right UVJ presented last year   - will need CT Renal Stone study for conformation of stone or evaluation for other etiology for pain- RTC for report  - patient was very upset that I would not give her pain medication at today's visit  - Advised to contact our office  or seek treatment in the ED if becomes febrile or pain/ vomiting are difficult control in order to arrange for emergent/urgent intervention  2. Hesitancy  - increase water intake  - Bladder scan  - Urinalysis, Complete  - CULTURE, URINE COMPREHENSIVE  3. History of kidney stones  - encouraged patient to increase water intake  - encouraged patient to stop consuming sodas  - would pursue 24 urine if she has another stone  Return for CT Renal stone study.  These notes generated with voice recognition software. I apologize for typographical errors.  Michiel CowboySHANNON Kahmari Koller, PA-C  [facility] [address], [address] [place], [place] [address] [phone]

## 2016-02-22 LAB — CULTURE, URINE COMPREHENSIVE

## 2016-03-11 ENCOUNTER — Ambulatory Visit: Payer: Self-pay | Admitting: Urology

## 2017-12-03 ENCOUNTER — Ambulatory Visit (INDEPENDENT_AMBULATORY_CARE_PROVIDER_SITE_OTHER): Payer: Medicaid Other | Admitting: Family Medicine

## 2017-12-03 ENCOUNTER — Encounter

## 2017-12-03 ENCOUNTER — Encounter: Payer: Self-pay | Admitting: Family Medicine

## 2017-12-03 VITALS — BP 108/75 | HR 109 | Temp 98.6°F | Ht 63.6 in | Wt 110.0 lb

## 2017-12-03 DIAGNOSIS — B029 Zoster without complications: Secondary | ICD-10-CM

## 2017-12-03 DIAGNOSIS — J342 Deviated nasal septum: Secondary | ICD-10-CM | POA: Insufficient documentation

## 2017-12-03 DIAGNOSIS — G43909 Migraine, unspecified, not intractable, without status migrainosus: Secondary | ICD-10-CM

## 2017-12-03 DIAGNOSIS — Z7689 Persons encountering health services in other specified circumstances: Secondary | ICD-10-CM

## 2017-12-03 DIAGNOSIS — Z23 Encounter for immunization: Secondary | ICD-10-CM | POA: Diagnosis not present

## 2017-12-03 DIAGNOSIS — F329 Major depressive disorder, single episode, unspecified: Secondary | ICD-10-CM | POA: Insufficient documentation

## 2017-12-03 DIAGNOSIS — F410 Panic disorder [episodic paroxysmal anxiety] without agoraphobia: Secondary | ICD-10-CM | POA: Insufficient documentation

## 2017-12-03 DIAGNOSIS — F332 Major depressive disorder, recurrent severe without psychotic features: Secondary | ICD-10-CM | POA: Diagnosis not present

## 2017-12-03 DIAGNOSIS — F32A Depression, unspecified: Secondary | ICD-10-CM | POA: Insufficient documentation

## 2017-12-03 DIAGNOSIS — R011 Cardiac murmur, unspecified: Secondary | ICD-10-CM

## 2017-12-03 MED ORDER — ACYCLOVIR 800 MG PO TABS: 800.0000 mg | ORAL_TABLET | Freq: Every day | ORAL | 0 refills | Status: DC

## 2017-12-03 MED ORDER — PREDNISONE 50 MG PO TABS: ORAL_TABLET | ORAL | 0 refills | Status: DC

## 2017-12-03 MED ORDER — QUETIAPINE FUMARATE 50 MG PO TABS: ORAL_TABLET | ORAL | 1 refills | Status: DC
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
## 2017-12-03 NOTE — Patient Instructions (Signed)
Zyrtec and flonase daily, sudafed as needed for drainage, delsym cough syrup.

## 2017-12-10 ENCOUNTER — Ambulatory Visit
Admission: RE | Admit: 2017-12-10 | Discharge: 2017-12-10 | Disposition: A | Discharge status: Home / Self Care | Payer: Disability Insurance | Source: Ambulatory Visit | Attending: Internal Medicine | Admitting: Internal Medicine

## 2017-12-10 ENCOUNTER — Other Ambulatory Visit: Payer: Self-pay | Admitting: Internal Medicine

## 2017-12-10 DIAGNOSIS — M25551 Pain in right hip: Secondary | ICD-10-CM | POA: Diagnosis not present

## 2017-12-10 DIAGNOSIS — M25552 Pain in left hip: Secondary | ICD-10-CM

## 2017-12-12 DIAGNOSIS — G43909 Migraine, unspecified, not intractable, without status migrainosus: Secondary | ICD-10-CM | POA: Insufficient documentation

## 2017-12-12 NOTE — Assessment & Plan Note (Signed)
Long discussion about appropriate indications for medications such as cheratussin, pt aware I will not prescribe this medication for post-nasal drainage cough/deviated septum. Recommended zyrtec and flonase regimen to control bothersome sxs.

## 2017-12-12 NOTE — Assessment & Plan Note (Signed)
Recommended starting a daily medication like nortriptyline, and that I did not feel fioricet was appropriate in her situation where a preventative or abortive medication was being utilized. Pt not wanting to add more medicines due to cost. Will work on stress relief and improving sleep with seroquel and add something if needed.

## 2017-12-12 NOTE — Assessment & Plan Note (Signed)
Start seroquel and continue to monitor. Cannot afford counseling currently per pt. Will refer to Psychiatry once insurance goes through. ER reviewed in case worsening or becoming a danger to self or others

## 2017-12-12 NOTE — Assessment & Plan Note (Signed)
Recommended Psychiatry referral, pt cannot afford this currently. Will start seroquel for sleep and anxiety control and continue to monitor.

## 2017-12-22 ENCOUNTER — Telehealth: Payer: Self-pay | Admitting: Family Medicine

## 2017-12-22 MED ORDER — QUETIAPINE FUMARATE 25 MG PO TABS
ORAL_TABLET | ORAL | 1 refills | Status: DC
Start: 2017-12-22 — End: 2018-02-18

## 2017-12-22 NOTE — Telephone Encounter (Signed)
Pt would like a call in regards to medicine prescribed to her on 12/03/2017.

## 2017-12-22 NOTE — Telephone Encounter (Signed)
Returned patient call - states the seroquel is doing great at bedtime but is not lasting through the day. Wanting daytime dose as well for better coverage throughout the day. Agreeable to taking 1-2 tabs of th e25 mg in the morning and sticking with the 50 mg tabs at bedtime. Does not have insurance and cannot afford the extended release tabs.

## 2017-12-31 ENCOUNTER — Ambulatory Visit: Payer: Medicaid Other | Admitting: Family Medicine

## 2018-01-24 ENCOUNTER — Ambulatory Visit: Payer: Self-pay

## 2018-01-24 DIAGNOSIS — H524 Presbyopia: Secondary | ICD-10-CM | POA: Diagnosis not present

## 2018-01-24 NOTE — Telephone Encounter (Signed)
Patient called in with c/o "hemorrhoids." She says "over the weekend, I ate what I shouldn't have and had to go to the bathroom for a bowel movement more often than usual, straining when I went. My bowels weren't constipated, but I did have to strain to push it out. My rectum started hurting and itching probably yesterday. I have been using hydrocortisone cream on the outside and putting it on the inside, but it's not helping. I did have some bleeding, just a little, when I went to the bathroom more this weekend, but it's not bleeding anymore." I asked is it swollen, she says "honestly, I haven't looked to see with a mirror. I would like for Fleet ContrasRachel to call me in Anusol Suppository for my hemorrhoids." I advised that because this is a new problem, the provider may not call in that medication and an office visit will be needed.  According to protocol, see PCP within 24 hours. She says "I don't want to come in. I have an appointment on 02/02/18. If she or Trula OreChristina will call me back and let me know if the suppository will be called in." I advised I will send this to the office and someone will call with Rachel's recommendation.   Reason for Disposition . MODERATE-SEVERE rectal pain (i.e., interferes with school, work, or sleep)  Answer Assessment - Initial Assessment Questions 1. SYMPTOM:  "What's the main symptom you're concerned about?" (e.g., pain, itching, swelling, rash)     Pain and itching 2. ONSET: "When did the symptoms start?"     Yesterday 3. RECTAL PAIN: "Do you have any pain around your rectum?" "How bad is the pain?"  (Scale 1-10; or mild, moderate, severe)  - MILD (1-3): doesn't interfere with normal activities   - MODERATE (4-7): interferes with normal activities or awakens from sleep, limping   - SEVERE (8-10): excruciating pain, unable to have a bowel movement      5 when not sitting; 6-7 when sitting 4. RECTAL ITCHING: "Do you have any itching in this area?" "How bad is the itching?"   (Scale 1-10; or mild, moderate, severe)  - MILD - doesn't interfere with normal activities   - MODERATE - SEVERE: interferes with normal activities or awakens from sleep     Moderate 5. CONSTIPATION: "Do you have constipation?" If so, "How bad is it?"     No, but had more bowel movements over the weekend and had to strain more than usual due to what I ate 6. CAUSE: "What do you think is causing the anus symptoms?"     Hemorrhoids 7. OTHER SYMPTOMS: "Do you have any other symptoms?"  (e.g., rectal bleeding, abdominal pain, vomiting, fever)     No 8. PREGNANCY: "Is there any chance you are pregnant?" "When was your last menstrual period?"     No  Protocols used: RE[facility]AL [facility]

## 2018-01-25 DIAGNOSIS — H5213 Myopia, bilateral: Secondary | ICD-10-CM | POA: Diagnosis not present

## 2018-01-28 ENCOUNTER — Other Ambulatory Visit: Payer: Self-pay | Admitting: Family Medicine

## 2018-01-28 DIAGNOSIS — Z23 Encounter for immunization: Secondary | ICD-10-CM | POA: Diagnosis not present

## 2018-01-28 NOTE — Telephone Encounter (Signed)
[facility] with [facility] at [facility] who states that refill for Seroquel 50 mg does not have any remaining refills and Seroquel 25 mg was refilled and picked up from the pharmacy on 01/22/18

## 2018-01-28 NOTE — Telephone Encounter (Signed)
Copied from CRM [id number]#[id number]. Topic: Quick Communication - Rx Refill/Question >> Jan 28, 2018 12:37 PM Mickel BaasMcGee, Wahneta Derocher B, NT wrote: Medication: QUEtiapine (SEROQUEL) 50 MG tablet   Has the patient contacted their pharmacy? Yes.   (Agent: If no, request that the patient contact the pharmacy for the refill.) (Agent: If yes, when and what did the pharmacy advise?)  Preferred Pharmacy (with phone number or street name): [id number] - [place] ([place]), [place] - [address]  Agent: Please be advised that RX refills may take up to 3 business days. We ask that you follow-up with your pharmacy.

## 2018-01-29 ENCOUNTER — Other Ambulatory Visit: Payer: Self-pay | Admitting: Internal Medicine

## 2018-01-31 MED ORDER — QUETIAPINE FUMARATE 50 MG PO TABS: ORAL_TABLET | ORAL | 1 refills | Status: DC

## 2018-02-02 ENCOUNTER — Ambulatory Visit: Payer: Medicaid Other | Admitting: Family Medicine

## 2018-02-17 DIAGNOSIS — M25552 Pain in left hip: Secondary | ICD-10-CM | POA: Diagnosis not present

## 2018-02-17 DIAGNOSIS — R739 Hyperglycemia, unspecified: Secondary | ICD-10-CM | POA: Diagnosis not present

## 2018-02-17 DIAGNOSIS — H5213 Myopia, bilateral: Secondary | ICD-10-CM | POA: Diagnosis not present

## 2018-02-17 DIAGNOSIS — E119 Type 2 diabetes mellitus without complications: Secondary | ICD-10-CM | POA: Diagnosis not present

## 2018-02-18 ENCOUNTER — Ambulatory Visit: Payer: Medicaid Other | Admitting: Family Medicine

## 2018-02-18 ENCOUNTER — Ambulatory Visit (INDEPENDENT_AMBULATORY_CARE_PROVIDER_SITE_OTHER): Payer: Medicaid Other | Admitting: Family Medicine

## 2018-02-18 ENCOUNTER — Telehealth: Payer: Self-pay | Admitting: Family Medicine

## 2018-02-18 ENCOUNTER — Telehealth: Payer: Self-pay

## 2018-02-18 ENCOUNTER — Encounter: Payer: Self-pay | Admitting: Family Medicine

## 2018-02-18 VITALS — BP 124/67 | HR 97 | Temp 98.0°F | Ht 63.5 in | Wt 120.0 lb

## 2018-02-18 DIAGNOSIS — J4520 Mild intermittent asthma, uncomplicated: Secondary | ICD-10-CM | POA: Diagnosis not present

## 2018-02-18 DIAGNOSIS — R350 Frequency of micturition: Secondary | ICD-10-CM

## 2018-02-18 DIAGNOSIS — K649 Unspecified hemorrhoids: Secondary | ICD-10-CM | POA: Insufficient documentation

## 2018-02-18 DIAGNOSIS — F410 Panic disorder [episodic paroxysmal anxiety] without agoraphobia: Secondary | ICD-10-CM | POA: Diagnosis not present

## 2018-02-18 DIAGNOSIS — F332 Major depressive disorder, recurrent severe without psychotic features: Secondary | ICD-10-CM | POA: Diagnosis not present

## 2018-02-18 DIAGNOSIS — M1990 Unspecified osteoarthritis, unspecified site: Secondary | ICD-10-CM | POA: Diagnosis not present

## 2018-02-18 DIAGNOSIS — R21 Rash and other nonspecific skin eruption: Secondary | ICD-10-CM

## 2018-02-18 DIAGNOSIS — J45909 Unspecified asthma, uncomplicated: Secondary | ICD-10-CM | POA: Insufficient documentation

## 2018-02-18 DIAGNOSIS — K641 Second degree hemorrhoids: Secondary | ICD-10-CM | POA: Diagnosis not present

## 2018-02-18 DIAGNOSIS — R7303 Prediabetes: Secondary | ICD-10-CM

## 2018-02-18 LAB — UA/M W/RFLX CULTURE, ROUTINE
Bilirubin, UA: NEGATIVE
Glucose, UA: NEGATIVE
Ketones, UA: NEGATIVE
Leukocytes, UA: NEGATIVE
Nitrite, UA: NEGATIVE
Protein, UA: NEGATIVE
RBC, UA: NEGATIVE
Specific Gravity, UA: 1.025 (ref 1.005–1.030)
Urobilinogen, Ur: 0.2 mg/dL (ref 0.2–1.0)
pH, UA: 5.5 (ref 5.0–7.5)

## 2018-02-18 MED ORDER — TRIAMCINOLONE ACETONIDE 0.1 % EX CREA: 1.0000 "application " | TOPICAL_CREAM | Freq: Two times a day (BID) | CUTANEOUS | 0 refills | Status: DC

## 2018-02-18 MED ORDER — ALBUTEROL SULFATE HFA 108 (90 BASE) MCG/ACT IN AERS: 2.0000 | INHALATION_SPRAY | Freq: Four times a day (QID) | RESPIRATORY_TRACT | 3 refills | Status: DC | PRN

## 2018-02-18 MED ORDER — QUETIAPINE FUMARATE 25 MG PO TABS: ORAL_TABLET | ORAL | 1 refills | Status: DC

## 2018-02-18 MED ORDER — DICLOFENAC SODIUM 1 % TD GEL: 2.0000 g | Freq: Four times a day (QID) | TRANSDERMAL | 3 refills | Status: DC

## 2018-02-18 MED ORDER — VENLAFAXINE HCL ER 75 MG PO CP24: 75.0000 mg | ORAL_CAPSULE | Freq: Every day | ORAL | 0 refills | Status: DC

## 2018-02-18 MED ORDER — ACYCLOVIR 800 MG PO TABS: 800.0000 mg | ORAL_TABLET | Freq: Every day | ORAL | 0 refills | Status: DC

## 2018-02-18 MED ORDER — QUETIAPINE FUMARATE 50 MG PO TABS: ORAL_TABLET | ORAL | 1 refills | Status: DC

## 2018-02-18 MED ORDER — HYDROCORTISONE ACETATE 25 MG RE SUPP: 25.0000 mg | Freq: Two times a day (BID) | RECTAL | 2 refills | Status: DC

## 2018-02-18 NOTE — Progress Notes (Signed)
BP 124/67 (BP Location: Left Arm, Cuff Size: Small)   Pulse 97   Temp 98 F (36.7 C) (Oral)   Ht 5' 3.5" (1.613 m)   Wt 120 lb (54.4 kg)   SpO2 99%   BMI 20.92 kg/m    Subjective:    Patient ID: Karen Foster, female    DOB: [birth year], 57 y.o.   MRN: [id number]  HPI: Karen Foster is a 57 y.o. female  Chief Complaint  Patient presents with  . Depression  . Hip Pain    pt states she has been having left hip pain, had xrays in October, requesting voltaren gel. States it helped in the past.   . Hemorrhoids    pt states she would like a prescription for Anusol HC  . Asthma    pt is requesting albuterol inhaler   Here today for follow up anxiety, PTSD, and depression. Was started on seroquel 25-50 mg nightly and since has been increased to 25-50 mg daytime and 50 mg nighttime. Does not feel like the seroquel is benefiting her, wanting to get back on her tranxene she was on previously. 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 No SI/HI. Declines counseling or Psychiatry at this time.   States she is on victoza for prediabetes and has been out for several months, wanting to get back on it. Does not remember when her last labs were. No low blood sugar spells. Did not mention this diagnosis or medication at her initial establish care visit 2 months ago.   States she has intermittent seasonal reactive airway issues for which she is well controlled on albuterol. This was also not mentioned at establish care visit. Out of her inhaler and requesting a refill. Does smoke cigarettes. Unknown last spirometry. Denies current CP, SOB, wheezing.   Also now discloses she uses diclofenac gel for her joint pains from arthritis. This seems to help but still has pain and stiffness.   On zovirax prn for HSV outbreaks. Seems to be doing well with that.   Still dealing with a painful rash on left upper back. Did not complete full course of antiviral for suspected shingles. Not  applying anything topically.   Urinary frequency and dysuria intermittently the past few weeks. Hx of kidney stones, states this feels different from that. Denies fevers, chills, N/V/D.   Struggling with hemorrhoids for quite some time now. Trying OTC creams without relief. No bleeding, dark stools, significant constipation.    Past Medical History:  Diagnosis Date  . Allergy   . Anxiety   . Arthritis   . Depression   . GERD (gastroesophageal reflux disease)   . Glaucoma   . Heart murmur   . Kidney stones   . Migraines   . Panic attack   . Panic attack     Relevant past medical, surgical, family and social history reviewed and updated as indicated. Interim medical history since our last visit reviewed. Allergies and medications reviewed and updated.  Review of Systems  Per HPI unless specifically indicated above     Objective:    BP 124/67 (BP Location: Left Arm, Cuff Size: Small)   Pulse 97   Temp 98 F (36.7 C) (Oral)   Ht 5' 3.5" (1.613 m)   Wt 120 lb (54.4 kg)   SpO2 99%   BMI 20.92 kg/m   Wt Readings from Last 3 Encounters:  02/18/18 120 lb (54.4 kg)  12/03/17 110 lb (  49.9 kg)  02/19/16 118 lb 8 oz (53.8 kg)    Physical Exam Vitals signs and nursing note reviewed.  Constitutional:      Appearance: Normal appearance.  HENT:     Head: Atraumatic.     Nose: Nose normal.     Mouth/Throat:     Mouth: Mucous membranes are moist.     Pharynx: Oropharynx is clear.  Eyes:     Extraocular Movements: Extraocular movements intact.     Conjunctiva/sclera: Conjunctivae normal.  Neck:     Musculoskeletal: Normal range of motion and neck supple.  Cardiovascular:     Rate and Rhythm: Normal rate and regular rhythm.     Heart sounds: Normal heart sounds.  Pulmonary:     Effort: Pulmonary effort is normal.     Breath sounds: Normal breath sounds. No wheezing.  Musculoskeletal: Normal range of motion.        General: No swelling or deformity.  Skin:    General:  Skin is warm and dry.     Comments: Two to three pinpoint red lesions localized to left upper back, non vesicular. Area ttp  Neurological:     General: No focal deficit present.     Mental Status: She is alert and oriented to person, place, and time.  Psychiatric:        Mood and Affect: Mood normal.        Behavior: Behavior normal.     Results for orders placed or performed in visit on 02/18/18  HgB A1c  Result Value Ref Range   Hgb A1c MFr Bld 5.3 4.8 - 5.6 %   Est. average glucose Bld gHb Est-mCnc 105 mg/dL  Basic Metabolic Panel (BMET)  Result Value Ref Range   Glucose 90 65 - 99 mg/dL   BUN 20 6 - 24 mg/dL   Creatinine, Ser 1.610.81 0.57 - 1.00 mg/dL   GFR calc non Af Amer 81 >59 mL/min/1.73   GFR calc Af Amer 94 >59 mL/min/1.73   BUN/Creatinine Ratio 25 (H) 9 - 23   Sodium 140 134 - 144 mmol/L   Potassium 4.0 3.5 - 5.2 mmol/L   Chloride 104 96 - 106 mmol/L   CO2 22 20 - 29 mmol/L   Calcium 9.0 8.7 - 10.2 mg/dL  UA/M w/rflx Culture, Routine  Result Value Ref Range   Specific Gravity, UA 1.025 1.005 - 1.030   pH, UA 5.5 5.0 - 7.5   Color, UA Yellow Yellow   Appearance Ur Clear Clear   Leukocytes, UA Negative Negative   Protein, UA Negative Negative/Trace   Glucose, UA Negative Negative   Ketones, UA Negative Negative   RBC, UA Negative Negative   Bilirubin, UA Negative Negative   Urobilinogen, Ur 0.2 0.2 - 1.0 mg/dL   Nitrite, UA Negative Negative      Assessment & Plan:   Problem List Items Addressed This Visit      Cardiovascular and Mediastinum   Hemorrhoids    Will send anusol suppositories. Discussed supportive care as well as preventative measures including increasing water intake and starting a miralax regimen to soften stools        Respiratory   Reactive airway disease    Per patient, historically well controlled with prn albuterol. Will send inhaler and continue to monitor. Smoking cessation recommended, pt declines. Will obtain spirometry if  worsening beyond albuterol particularly as it is unclear if/when she's had one.         Musculoskeletal and Integument  Arthritis    Diclofenac gel, tylenol, anti inflammatory diet        Other   Panic disorder    Discussed with patient that I am not comfortable starting her on tranxene and that I would be happy to refer her to Psychiatry, she declines and wants to continue seroquel      Relevant Medications   venlafaxine XR (EFFEXOR XR) 75 MG 24 hr capsule   Depression - Primary    Continue current seroquel regimen, add effexor. Recommended counseling/psychiatry again but pt declines      Relevant Medications   venlafaxine XR (EFFEXOR XR) 75 MG 24 hr capsule   Prediabetes    Unclear when last labs were obtained, or what prior A1Cs have been. It is also unclear why she was not started on metformin if prediabetic - states she was never offered any tablets for treatment/prevention. Do not feel this is an appropriate setting for victoza particularly given her BMI of 19-20. Will check A1C today and start metformin if not in range. Diet and exercise reviewed      Relevant Orders   HgB A1c (Completed)   Basic Metabolic Panel (BMET) (Completed)    Other Visit Diagnoses    Urinary frequency       U/A normal, push fluids and follow up for recheck if sxs worsening   Relevant Orders   UA/M w/rflx Culture, Routine (Completed)   Rash       Suspect a post-herpetic neuralgia given lack of significant rash but still having pain. Will trial steroid cream per pt request and cont zovirax    Discussed with patient the importance of full disclosure with her medical history and medications as there were many things she did not disclose during her initial visit. She states this was because she was self pay at the time and was not able to pay for those medicines so did not mention them. Reiterated importance of knowing what she's on and every chronic issue for safety reasons regardless of finances or  circumstance. Pt verbalized understanding.    Follow up plan: Return in about 4 weeks (around 03/18/2018) for CPE, separate visit for mood f/u.

## 2018-02-18 NOTE — Telephone Encounter (Signed)
rx sent

## 2018-02-18 NOTE — Telephone Encounter (Signed)
Pt is now scheduled for this afternoon.

## 2018-02-18 NOTE — Telephone Encounter (Signed)
Patient states that she is using Flovent inhaler, [facility]

## 2018-02-18 NOTE — Telephone Encounter (Signed)
She is absolutely required, I've only seen her once and she's now missed two follow ups on new start medication. Will not refill anything for her until seen.   Copied from CRM [phone]. Topic: Appointment Scheduling - Scheduling Inquiry for Clinic >> Feb 18, 2018  9:17 AM Karen Foster, Karen Foster wrote: Reason for CRM:   Pt wants to know if she is required to come in for her medication check today.  States she wants to speak with Karen Foster's nurse because they will often just call in medication for her and that is what she would like to happen.

## 2018-02-18 NOTE — Telephone Encounter (Signed)
Copied from CRM [phone]. Topic: Quick Communication - Rx Refill/Question >> Feb 18, 2018  1:44 PM Jilda Rocheemaray, Melissa wrote: Medication: acyclovir (ZOVIRAX) 800 MG tablet   Has the patient contacted their pharmacy? Yes.   (Agent: If no, request that the patient contact the pharmacy for the refill.) (Agent: If yes, when and what did the pharmacy advise?) Had multiple scripts called in today but this one was missed  Preferred Pharmacy (with phone number or street name):   Agent: Please be advised that RX refills may take up to 3 business days. We ask that you follow-up with your pharmacy.

## 2018-02-19 DIAGNOSIS — M1990 Unspecified osteoarthritis, unspecified site: Secondary | ICD-10-CM | POA: Insufficient documentation

## 2018-02-19 LAB — HEMOGLOBIN A1C
Est. average glucose Bld gHb Est-mCnc: 105 mg/dL
Hgb A1c MFr Bld: 5.3 % (ref 4.8–5.6)

## 2018-02-19 LAB — BASIC METABOLIC PANEL
BUN/Creatinine Ratio: 25 — ABNORMAL HIGH (ref 9–23)
BUN: 20 mg/dL (ref 6–24)
CO2: 22 mmol/L (ref 20–29)
Calcium: 9 mg/dL (ref 8.7–10.2)
Chloride: 104 mmol/L (ref 96–106)
Creatinine, Ser: 0.81 mg/dL (ref 0.57–1.00)
GFR calc Af Amer: 94 mL/min/{1.73_m2} (ref >59)
GFR calc non Af Amer: 81 mL/min/{1.73_m2} (ref >59)
Glucose: 90 mg/dL (ref 65–99)
Potassium: 4 mmol/L (ref 3.5–5.2)
Sodium: 140 mmol/L (ref 134–144)

## 2018-02-19 NOTE — Telephone Encounter (Signed)
Pt did not disclose that she was on that medication during her visit. Stated during visit that she had very mild reactive airway issues during seasonal changes that was controlled on prn albuterol and asked for albuterol. I just received a call from the on call line about this request as well and attempted to call patient to discuss. Left VM for her to return my call on Monday morning to discuss. Will need further evaluation for this request including spirometry to evaluate extent of her pulmonary issues as this is a step up in therapy and I have no records of her past history.

## 2018-02-19 NOTE — Assessment & Plan Note (Signed)
Will send anusol suppositories. Discussed supportive care as well as preventative measures including increasing water intake and starting a miralax regimen to soften stools

## 2018-02-19 NOTE — Assessment & Plan Note (Signed)
Unclear when last labs were obtained, or what prior A1Cs have been. It is also unclear why she was not started on metformin if prediabetic - states she was never offered any tablets for treatment/prevention. Do not feel this is an appropriate setting for victoza particularly given her BMI of 19-20. Will check A1C today and start metformin if not in range. Diet and exercise reviewed

## 2018-02-19 NOTE — Assessment & Plan Note (Signed)
Diclofenac gel, tylenol, anti inflammatory diet

## 2018-02-19 NOTE — Assessment & Plan Note (Signed)
Continue current seroquel regimen, add effexor. Recommended counseling/psychiatry again but pt declines

## 2018-02-19 NOTE — Assessment & Plan Note (Signed)
Per patient, historically well controlled with prn albuterol. Will send inhaler and continue to monitor. Smoking cessation recommended, pt declines. Will obtain spirometry if worsening beyond albuterol particularly as it is unclear if/when she's had one.

## 2018-02-19 NOTE — Assessment & Plan Note (Signed)
Discussed with patient that I am not comfortable starting her on tranxene and that I would be happy to refer her to Psychiatry, she declines and wants to continue seroquel

## 2018-02-21 NOTE — Telephone Encounter (Signed)
Patient returned call and was told further evaluation needed to be done and she needed to be seen again.  She said she just saw Fleet ContrasRachel, so she will wait until her next appt a month from now to come back in.

## 2018-02-22 ENCOUNTER — Emergency Department: Payer: Medicaid Other

## 2018-02-22 ENCOUNTER — Other Ambulatory Visit: Payer: Self-pay

## 2018-02-22 ENCOUNTER — Ambulatory Visit (INDEPENDENT_AMBULATORY_CARE_PROVIDER_SITE_OTHER): Payer: Medicaid Other | Admitting: Family Medicine

## 2018-02-22 ENCOUNTER — Emergency Department
Admission: EM | Admit: 2018-02-22 | Discharge: 2018-02-22 | Disposition: A | Discharge status: Home / Self Care | Payer: Medicaid Other | Attending: Emergency Medicine | Admitting: Emergency Medicine

## 2018-02-22 ENCOUNTER — Encounter: Payer: Self-pay | Admitting: Family Medicine

## 2018-02-22 VITALS — BP 123/87 | HR 77 | Temp 98.2°F | Wt 121.0 lb

## 2018-02-22 DIAGNOSIS — Y999 Unspecified external cause status: Secondary | ICD-10-CM | POA: Insufficient documentation

## 2018-02-22 DIAGNOSIS — S52501A Unspecified fracture of the lower end of right radius, initial encounter for closed fracture: Secondary | ICD-10-CM | POA: Diagnosis not present

## 2018-02-22 DIAGNOSIS — R7303 Prediabetes: Secondary | ICD-10-CM | POA: Diagnosis not present

## 2018-02-22 DIAGNOSIS — W108XXA Fall (on) (from) other stairs and steps, initial encounter: Secondary | ICD-10-CM | POA: Insufficient documentation

## 2018-02-22 DIAGNOSIS — S52572A Other intraarticular fracture of lower end of left radius, initial encounter for closed fracture: Secondary | ICD-10-CM | POA: Diagnosis not present

## 2018-02-22 DIAGNOSIS — S52502A Unspecified fracture of the lower end of left radius, initial encounter for closed fracture: Secondary | ICD-10-CM | POA: Diagnosis not present

## 2018-02-22 DIAGNOSIS — M25532 Pain in left wrist: Secondary | ICD-10-CM | POA: Diagnosis not present

## 2018-02-22 DIAGNOSIS — Y9389 Activity, other specified: Secondary | ICD-10-CM | POA: Insufficient documentation

## 2018-02-22 DIAGNOSIS — F17210 Nicotine dependence, cigarettes, uncomplicated: Secondary | ICD-10-CM | POA: Insufficient documentation

## 2018-02-22 DIAGNOSIS — S52615A Nondisplaced fracture of left ulna styloid process, initial encounter for closed fracture: Secondary | ICD-10-CM | POA: Insufficient documentation

## 2018-02-22 DIAGNOSIS — Z79899 Other long term (current) drug therapy: Secondary | ICD-10-CM | POA: Insufficient documentation

## 2018-02-22 DIAGNOSIS — S52612A Displaced fracture of left ulna styloid process, initial encounter for closed fracture: Secondary | ICD-10-CM | POA: Diagnosis not present

## 2018-02-22 DIAGNOSIS — S62102A Fracture of unspecified carpal bone, left wrist, initial encounter for closed fracture: Secondary | ICD-10-CM

## 2018-02-22 DIAGNOSIS — Y92008 Other place in unspecified non-institutional (private) residence as the place of occurrence of the external cause: Secondary | ICD-10-CM | POA: Diagnosis not present

## 2018-02-22 DIAGNOSIS — S6992XA Unspecified injury of left wrist, hand and finger(s), initial encounter: Secondary | ICD-10-CM | POA: Diagnosis present

## 2018-02-22 MED ORDER — LIRAGLUTIDE 18 MG/3ML ~~LOC~~ SOPN: 1.8000 mg | PEN_INJECTOR | Freq: Two times a day (BID) | SUBCUTANEOUS | 0 refills | Status: DC

## 2018-02-22 MED ORDER — TRAMADOL HCL 50 MG PO TABS: 50.0000 mg | ORAL_TABLET | Freq: Two times a day (BID) | ORAL | 0 refills | Status: DC | PRN

## 2018-02-22 MED ORDER — ACETAMINOPHEN 325 MG PO TABS
650.0000 mg | ORAL_TABLET | Freq: Once | ORAL | Status: AC
  Administered 2018-02-22: 650 mg via ORAL
  Filled 2018-02-22: qty 2

## 2018-02-22 MED ORDER — HYDROCODONE-ACETAMINOPHEN 5-325 MG PO TABS: 1.0000 | ORAL_TABLET | Freq: Three times a day (TID) | ORAL | 0 refills | Status: AC | PRN

## 2018-02-22 NOTE — Discharge Instructions (Addendum)
You are being treated for a wrist fracture.  Keep the splint clean and dry and in place until you follow-up with orthopedics.  Take the pain medicines as needed.  Call Dr. Martha ClanKrasinski after Christmas to schedule a follow-up appointment.

## 2018-02-22 NOTE — Consult Note (Signed)
ORTHOPAEDIC CONSULTATION  REQUESTING PHYSICIAN: Carrie Mew, MD  Chief Complaint: Left wrist fracture  HPI: Karen Foster is a 57 y.o. female who presents to the ED with a left wrist injury after a fall early this AM.   ER PA called me regarding management.  Patient is reported to have intact skin and to have intact neurovascular and motor function.    Past Medical History:  Diagnosis Date  . Allergy   . Anxiety   . Arthritis   . Depression   . GERD (gastroesophageal reflux disease)   . Glaucoma   . Heart murmur   . Kidney stones   . Migraines   . Panic attack   . Panic attack    Past Surgical History:  Procedure Laterality Date  . ABDOMINAL HYSTERECTOMY    . FRACTURE SURGERY    . JOINT REPLACEMENT  2011   Hip  . REVISION TOTAL HIP ARTHROPLASTY  2011  . SEPTOPLASTY  2007  . TONSILLECTOMY     Social History   Socioeconomic History  . Marital status: Single    Spouse name: Not on file  . Number of children: Not on file  . Years of education: Not on file  . Highest education level: Not on file  Occupational History  . Not on file  Social Needs  . Financial resource strain: Not on file  . Food insecurity:    Worry: Not on file    Inability: Not on file  . Transportation needs:    Medical: Not on file    Non-medical: Not on file  Tobacco Use  . Smoking status: Current Every Day Smoker  . Smokeless tobacco: Never Used  Substance and Sexual Activity  . Alcohol use: No  . Drug use: No  . Sexual activity: Not Currently  Lifestyle  . Physical activity:    Days per week: Not on file    Minutes per session: Not on file  . Stress: Not on file  Relationships  . Social connections:    Talks on phone: Not on file    Gets together: Not on file    Attends religious service: Not on file    Active member of club or organization: Not on file    Attends meetings of clubs or organizations: Not on file    Relationship status: Not on file  Other Topics Concern  .  Not on file  Social History Narrative  . Not on file   Family History  Problem Relation Age of Onset  . Kidney failure Mother   . Heart attack Mother   . Asthma Mother   . Kidney disease Mother   . Diabetes Mother   . Kidney failure Maternal Grandmother   . Multiple myeloma Maternal Grandmother   . Other Maternal Grandmother        multiple myeloma  . Cancer Father   . Heart disease Maternal Aunt   . Hypertension Maternal Aunt   . Cancer Maternal Uncle   . Asthma Maternal Uncle   . Heart attack Paternal Aunt   . Heart attack Paternal Uncle   . Diabetes Paternal Uncle   . Heart attack Maternal Grandfather   . Hyperlipidemia Maternal Grandfather   . Diabetes Maternal Grandfather   . Prostate cancer Neg Hx   . Bladder Cancer Neg Hx    Allergies  Allergen Reactions  . Ibuprofen Anaphylaxis  . Bee Venom   . Hydroxyzine Nausea Only  . Sulfa Antibiotics Other (See Comments)  .  Tomato    Prior to Admission medications   Medication Sig Start Date End Date Taking? Authorizing Provider  acyclovir (ZOVIRAX) 800 MG tablet Take 1 tablet (800 mg total) by mouth 5 (five) times daily. 02/18/18   [name], PA-C  albuterol (PROVENTIL HFA;VENTOLIN HFA) 108 (90 Base) MCG/ACT inhaler Inhale 2 puffs into the lungs every 6 (six) hours as needed for wheezing or shortness of breath. 02/18/18   [name], PA-C  diclofenac sodium (VOLTAREN) 1 % GEL Apply 2 g topically 4 (four) times daily. 02/18/18   [name], PA-C  HYDROcodone-acetaminophen (NORCO) 5-325 MG tablet Take 1 tablet by mouth 3 (three) times daily as needed for up to 5 days. 02/22/18 02/27/18  Menshew, Dannielle Karvonen, PA-C  hydrocortisone (ANUSOL-HC) 25 MG suppository Place 1 suppository (25 mg total) rectally 2 (two) times daily. 02/18/18   [name], PA-C  liraglutide (VICTOZA) 18 MG/3ML SOPN Inject 0.3 mLs (1.8 mg total) into the skin 2 (two) times daily. 02/22/18 03/24/18  Menshew,  Dannielle Karvonen, PA-C  QUEtiapine (SEROQUEL) 25 MG tablet Take 1-2 tablets every morning as needed. Continue taking 50 mg tablet nightly in addition 02/18/18   [name], PA-C  QUEtiapine (SEROQUEL) 50 MG tablet Can take 1-2 tabs daily 02/18/18   [name], PA-C  traMADol (ULTRAM) 50 MG tablet Take 1 tablet (50 mg total) by mouth every 12 (twelve) hours as needed. 02/22/18   [name], PA-C  triamcinolone cream (KENALOG) 0.1 % Apply 1 application topically 2 (two) times daily. 02/18/18   [name], PA-C  venlafaxine XR (EFFEXOR XR) 75 MG 24 hr capsule Take 1 capsule (75 mg total) by mouth daily with breakfast. 02/18/18   [name], PA-C   Dg Wrist Complete Left  Result Date: 02/22/2018 CLINICAL DATA:  Initial evaluation for acute trauma, fall. EXAM: LEFT WRIST - COMPLETE 3+ VIEW COMPARISON:  None. FINDINGS: Acute mildly comminuted transverse fracture extends through the distal left radius with intra-articular extension and slight impaction. Minimal dorsally lesion. Additional acute nondisplaced fracture extends through the base of the ulnar styloid. Diffuse soft tissue swelling seen about the wrist. Underlying mild osteopenia noted. IMPRESSION: Acute fractures involving the distal left radius and ulnar styloid as above. Electronically Signed   By: Jeannine Boga M.D.   On: 02/22/2018 19:49    Assessment: Closed, impacted left distal radius and ulnar styloid fracture  Plan: I have reviewed the xrays.  The fractures are minimally displaced.   I recommended the patient be placed in a sugar tong splint and sling.  Patient should continue strict elevation of the left upper extremity at home and may call our office on 12/26 for a follow up within the next week.     Thornton Park, MD    02/22/2018 8:30 PM

## 2018-02-22 NOTE — Progress Notes (Signed)
BP 123/87   Pulse 77   Temp 98.2 F (36.8 C) (Oral)   Wt 121 lb (54.9 kg)   SpO2 94%   BMI 21.10 kg/m    Subjective:    Patient ID: Karen Foster, female    DOB: 1961/02/23, 57 y.o.   MRN: [id number]  HPI: Karen DupesLori A Foster is a 57 y.o. female  Chief Complaint  Patient presents with  . Fall    Swollen left wrist. 5 a.m. this morning.    Fell early this morning, got her sock caught on a piece of the deck and fell down 4 steps on the deck. Swollen wrist with significant pain. Has taken tylenol and been using ice with minimal relief. States she does not want an x-ray, just wanted to come in to get it wrapped up really quick. Denies color change, decreased ROM, numbness or tingling, or other injury from fall.   Relevant past medical, surgical, family and social history reviewed and updated as indicated. Interim medical history since our last visit reviewed. Allergies and medications reviewed and updated.  Review of Systems  Per HPI unless specifically indicated above     Objective:    BP 123/87   Pulse 77   Temp 98.2 F (36.8 C) (Oral)   Wt 121 lb (54.9 kg)   SpO2 94%   BMI 21.10 kg/m   Wt Readings from Last 3 Encounters:  02/22/18 121 lb (54.9 kg)  02/22/18 121 lb (54.9 kg)  02/18/18 120 lb (54.4 kg)    Physical Exam Vitals signs and nursing note reviewed.  Constitutional:      Appearance: Normal appearance.  HENT:     Head: Atraumatic.  Eyes:     Extraocular Movements: Extraocular movements intact.     Conjunctiva/sclera: Conjunctivae normal.  Neck:     Musculoskeletal: Normal range of motion and neck supple.  Cardiovascular:     Rate and Rhythm: Normal rate and regular rhythm.     Pulses: Normal pulses.     Heart sounds: Normal heart sounds.  Pulmonary:     Effort: Pulmonary effort is normal.     Breath sounds: Normal breath sounds.  Musculoskeletal:        General: Swelling (focal swelling left wrist), tenderness (significant ttp over left wrist) and  signs of injury present.     Comments: ROM seems largely intact LUE but painful with motion so exam fairly limited today  Skin:    General: Skin is warm and dry.     Findings: No bruising or erythema.  Neurological:     General: No focal deficit present.     Mental Status: She is alert and oriented to person, place, and time.     Sensory: No sensory deficit (sensation intact b/l UEs).  Psychiatric:        Mood and Affect: Mood normal.        Thought Content: Thought content normal.     Results for orders placed or performed in visit on 02/18/18  HgB A1c  Result Value Ref Range   Hgb A1c MFr Bld 5.3 4.8 - 5.6 %   Est. average glucose Bld gHb Est-mCnc 105 mg/dL  Basic Metabolic Panel (BMET)  Result Value Ref Range   Glucose 90 65 - 99 mg/dL   BUN 20 6 - 24 mg/dL   Creatinine, Ser 4.090.81 0.57 - 1.00 mg/dL   GFR calc non Af Amer 81 >59 mL/min/1.73   GFR calc Af Amer 94 >59 mL/min/1.73  BUN/Creatinine Ratio 25 (H) 9 - 23   Sodium 140 134 - 144 mmol/L   Potassium 4.0 3.5 - 5.2 mmol/L   Chloride 104 96 - 106 mmol/L   CO2 22 20 - 29 mmol/L   Calcium 9.0 8.7 - 10.2 mg/dL  UA/M w/rflx Culture, Routine  Result Value Ref Range   Specific Gravity, UA 1.025 1.005 - 1.030   pH, UA 5.5 5.0 - 7.5   Color, UA Yellow Yellow   Appearance Ur Clear Clear   Leukocytes, UA Negative Negative   Protein, UA Negative Negative/Trace   Glucose, UA Negative Negative   Ketones, UA Negative Negative   RBC, UA Negative Negative   Bilirubin, UA Negative Negative   Urobilinogen, Ur 0.2 0.2 - 1.0 mg/dL   Nitrite, UA Negative Negative      Assessment & Plan:   Problem List Items Addressed This Visit    None    Visit Diagnoses    Left wrist pain    -  Primary   Relevant Orders   DG Wrist Complete Left    Strongly recommended x-ray, order placed. Patient refuses x-ray today because she wants to get through the holiday. Patient will go Thurs or Fri if still hurting but no sooner she states.  Discussed going to UC or ER if worsening at any time that clinics are closed. Asking for pain medicine as tylenol not working. Discussed using her diclofenac gel regularly on the area and will give small supply of 6 tramadol. Risks and addictive potential reviewed, and controlled substance database reviewed and appropriate. RICE discussed. Compression wrap placed today. Await x-ray results ASAP.   Follow up plan: Return for as scheduled.

## 2018-02-22 NOTE — ED Provider Notes (Signed)
[facility] Emergency Department Provider Note ____________________________________________  Time seen: 1945  I have reviewed the triage vital signs and the nursing notes.  HISTORY  Chief Complaint  Hand Injury  HPI RAI SINAGRA is a 57 y.o. right-handed female presents herself to the ED for left wrist pain and disability.  Patient describes a mechanical fall down the steps of her deck this morning, at around 5 AM.  She reports injury to the left wrist while trying to catch herself.  She apparently presented to her primary provider's office at about 8 AM, for evaluation.  With deformity and pain noted the PCP with concern for underlying fracture but did not have in-house radiology.  She placed an order for the patient have an outpatient radiology performed on Friday, after the patient's urging to not have her Christmas holiday disrupted.  Patient was discharged with a small prescription for Ultram, and was placed in an Ace bandage.  She presents now with increased pain, swelling, and obvious deformity to the left wrist.  She also has disability to the left wrist with flexion, extension, and supination.  Past Medical History:  Diagnosis Date  . Allergy   . Anxiety   . Arthritis   . Depression   . GERD (gastroesophageal reflux disease)   . Glaucoma   . Heart murmur   . Kidney stones   . Migraines   . Panic attack   . Panic attack     Patient Active Problem List   Diagnosis Date Noted  . Arthritis 02/19/2018  . Prediabetes 02/18/2018  . Hemorrhoids 02/18/2018  . Reactive airway disease 02/18/2018  . Migraines 12/12/2017  . Deviated septum 12/03/2017  . Heart murmur 12/03/2017  . Depression 12/03/2017  . Panic disorder     Past Surgical History:  Procedure Laterality Date  . ABDOMINAL HYSTERECTOMY    . FRACTURE SURGERY    . JOINT REPLACEMENT  2011   Hip  . REVISION TOTAL HIP ARTHROPLASTY  2011  . SEPTOPLASTY  2007  . TONSILLECTOMY      Prior to  Admission medications   Medication Sig Start Date End Date Taking? Authorizing Provider  acyclovir (ZOVIRAX) 800 MG tablet Take 1 tablet (800 mg total) by mouth 5 (five) times daily. 02/18/18   [name], PA-C  albuterol (PROVENTIL HFA;VENTOLIN HFA) 108 (90 Base) MCG/ACT inhaler Inhale 2 puffs into the lungs every 6 (six) hours as needed for wheezing or shortness of breath. 02/18/18   [name], PA-C  diclofenac sodium (VOLTAREN) 1 % GEL Apply 2 g topically 4 (four) times daily. 02/18/18   [name], PA-C  HYDROcodone-acetaminophen (NORCO) 5-325 MG tablet Take 1 tablet by mouth 3 (three) times daily as needed for up to 5 days. 02/22/18 02/27/18  Zackory Pudlo, Dannielle Karvonen, PA-C  hydrocortisone (ANUSOL-HC) 25 MG suppository Place 1 suppository (25 mg total) rectally 2 (two) times daily. 02/18/18   [name], PA-C  liraglutide (VICTOZA) 18 MG/3ML SOPN Inject 0.3 mLs (1.8 mg total) into the skin 2 (two) times daily. 02/22/18 03/24/18  Rue Tinnel, Dannielle Karvonen, PA-C  QUEtiapine (SEROQUEL) 25 MG tablet Take 1-2 tablets every morning as needed. Continue taking 50 mg tablet nightly in addition 02/18/18   [name], PA-C  QUEtiapine (SEROQUEL) 50 MG tablet Can take 1-2 tabs daily 02/18/18   [name], PA-C  traMADol (ULTRAM) 50 MG tablet Take 1 tablet (50 mg total) by mouth every 12 (twelve) hours as needed.  02/22/18   [name], PA-C  triamcinolone cream (KENALOG) 0.1 % Apply 1 application topically 2 (two) times daily. 02/18/18   [name], PA-C  venlafaxine XR (EFFEXOR XR) 75 MG 24 hr capsule Take 1 capsule (75 mg total) by mouth daily with breakfast. 02/18/18   [name], PA-C    Allergies Ibuprofen; Bee venom; Hydroxyzine; Sulfa antibiotics; and Tomato  Family History  Problem Relation Age of Onset  . Kidney failure Mother   . Heart attack Mother   . Asthma Mother   . Kidney disease  Mother   . Diabetes Mother   . Kidney failure Maternal Grandmother   . Multiple myeloma Maternal Grandmother   . Other Maternal Grandmother        multiple myeloma  . Cancer Father   . Heart disease Maternal Aunt   . Hypertension Maternal Aunt   . Cancer Maternal Uncle   . Asthma Maternal Uncle   . Heart attack Paternal Aunt   . Heart attack Paternal Uncle   . Diabetes Paternal Uncle   . Heart attack Maternal Grandfather   . Hyperlipidemia Maternal Grandfather   . Diabetes Maternal Grandfather   . Prostate cancer Neg Hx   . Bladder Cancer Neg Hx     Social History Social History   Tobacco Use  . Smoking status: Current Every Day Smoker  . Smokeless tobacco: Never Used  Substance Use Topics  . Alcohol use: No  . Drug use: No    Review of Systems  Constitutional: Negative for fever. Eyes: Negative for visual changes. ENT: Negative for sore throat. Cardiovascular: Negative for chest pain. Respiratory: Negative for shortness of breath. Musculoskeletal: Negative for back pain.  Left wrist pain and disability as above. Skin: Negative for rash. Neurological: Negative for headaches, focal weakness or numbness. ____________________________________________  PHYSICAL EXAM:  VITAL SIGNS: ED Triage Vitals  Enc Vitals Group     BP 02/22/18 1846 (!) 122/44     Pulse Rate 02/22/18 1846 74     Resp 02/22/18 1846 16     Temp 02/22/18 1846 97.7 F (36.5 C)     Temp Source 02/22/18 1846 Oral     SpO2 02/22/18 1846 97 %     Weight 02/22/18 1841 121 lb (54.9 kg)     Height 02/22/18 1841 _0  (1.6 m)     Head Circumference --      Peak Flow --      Pain Score 02/22/18 1841 8     Pain Loc --      Pain Edu? --      Excl. in [place]? --     Constitutional: Alert and oriented. Well appearing and in no distress. Head: Normocephalic and atraumatic. Eyes: Conjunctivae are normal. Normal extraocular movements Cardiovascular: Normal rate, regular rhythm. Normal distal  pulses. Respiratory: Normal respiratory effort. No wheezes/rales/rhonchi. Musculoskeletal: Left wrist with obvious deformity at the distal radius the malleolus.  Patient with decreased flexion extension range of motion.  She has decreased supination range nontender with normal range of motion in all extremities.  Neurologic:  Normal gait without ataxia. Normal speech and language. No gross focal neurologic deficits are appreciated. Skin:  Skin is warm, dry and intact. No rash noted. ____________________________________________   RADIOLOGY  Left Wrist  FINDINGS: Acute mildly comminuted transverse fracture extends through the distal left radius with intra-articular extension and slight impaction. Minimal dorsally lesion. Additional acute nondisplaced fracture extends through the base of the ulnar styloid. Diffuse  soft tissue swelling seen about the wrist. Underlying mild osteopenia noted. ____________________________________________  PROCEDURES Tylenol 650 mg PO  .Splint Application Date/Time: 24/19/9144 8:54 PM Performed by: Jefferson Fuel, NT Authorized by: Melvenia Needles, PA-C   Consent:    Consent obtained:  Verbal   Consent given by:  Patient Pre-procedure details:    Sensation:  Normal Procedure details:    Laterality:  Left   Location:  Wrist   Wrist:  L wrist   Splint type:  Sugar tong   Supplies:  Elastic bandage, cotton padding, sling and Ortho-Glass Post-procedure details:    Pain:  Improved   Sensation:  Normal   Patient tolerance of procedure:  Tolerated well, no immediate complications  ____________________________________________  INITIAL IMPRESSION / ASSESSMENT AND PLAN / ED COURSE  ----------------------------------------- 7:52 PM on 02/22/2018 ----------------------------------------- S/w Dr. Mack Guise: he suggests splinting and follow-up with him in the office.   Patient with initial fracture management of a closed left wrist fracture.  The wrist is appropriately splinted and she is given follow-up instructions. Prescriptions for Norco and her liraglutide are provided.   I reviewed the patient's prescription history over the last 12 months in the multi-state controlled substances database(s) that includes [place], [place], [place], [place], [place], [place], [place], [place], [place], [place], [place], [place], [place], and [place].  Results were notable for no current prescriptions.  ____________________________________________  FINAL CLINICAL IMPRESSION(S) / ED DIAGNOSES  Final diagnoses:  Wrist fracture, closed, left, initial encounter      Melvenia Needles, PA-C 02/23/18 0001    Carrie Mew, MD 02/23/18 2350

## 2018-02-22 NOTE — ED Notes (Signed)
Pt with injured left wrist from fall this morning. Pt has strong pulses on left hand, cap refill <3, but obvious deformity to the wrist.

## 2018-02-22 NOTE — Patient Instructions (Signed)
[facility] [address], [place], [place] [address] 

## 2018-02-22 NOTE — ED Triage Notes (Signed)
Pt states she tripped and fell this morning around 5am and injured her left hand/wrist trying to catch herself.

## 2018-02-24 ENCOUNTER — Telehealth: Payer: Self-pay | Admitting: Family Medicine

## 2018-02-24 ENCOUNTER — Ambulatory Visit: Payer: Self-pay | Admitting: *Deleted

## 2018-02-24 DIAGNOSIS — S52502A Unspecified fracture of the lower end of left radius, initial encounter for closed fracture: Secondary | ICD-10-CM | POA: Diagnosis not present

## 2018-02-24 DIAGNOSIS — S52615A Nondisplaced fracture of left ulna styloid process, initial encounter for closed fracture: Secondary | ICD-10-CM | POA: Diagnosis not present

## 2018-02-24 NOTE — Telephone Encounter (Signed)
Patient called to say that she was seen on 02/22/18 and was not able to get an Xray but due to pain she went to the ER where it was discovered that she actually has fractured her wrist in the fall. Per patient this call was just to inform Dr Maurice MarchLane of the outcome from ER. She stated that she does not want a call back she just wanted to provide information.  Reason for Disposition . [1] Other NON-URGENT information for PCP AND [2] does not require PCP response  Answer Assessment - Initial Assessment Questions 1. REASON FOR CALL or QUESTION: "What is your reason for calling today?" or "How can I best help you?" or "What question do you have that I can help answer?"     Informational call for PCP 2. CALLER: Document the source of call. (e.g., laboratory, patient).     patient  Protocols used: PCP CALL - NO TRIAGE-A-AH  Attempted to call patient to make sure she is stable and does not need to scheduled a follow up at this time- left message to call back if needed.

## 2018-02-24 NOTE — Telephone Encounter (Signed)
Pt is returning Karen Foster call again. Pt is having issues with her cell . Please call home number [phone]

## 2018-02-24 NOTE — Telephone Encounter (Signed)
Spoke with patient, states she did not ask for the victoza but stated she was previously on it so the provider sent it. States she didn't fill the script. Re-iterated the importance of not taking this medication as her blood sugars are normal at baseline and this could be dangerous. She verbalized understanding

## 2018-02-24 NOTE — Telephone Encounter (Signed)
Called pt to discuss her new rx from ER for victoza, left VM to return call. Specifically discussed with her that she was not prediabetic and there was no indication for her to be on this medicine and refused to fill it when requested but see that she asked the ER for it yesterday and was given the medicine. Need to reinforce to her that it can be very dangerous to take these medications when you don't need them for risk of hypoglycemia. Will discuss further when she returns call

## 2018-02-24 NOTE — Telephone Encounter (Signed)
Returned patient's call and left a VM to return call  Copied from CRM [id number]. Topic: Quick Communication - Office Called Patient (Clinic Use ONLY) >> Feb 24, 2018  9:33 AM Leafy Roobinson, Norma J wrote: Reason for CRM: pt is returning Zora Glendenning call concerning new rx victoza from er

## 2018-02-28 ENCOUNTER — Ambulatory Visit: Payer: Medicaid Other | Admitting: Family Medicine

## 2018-03-03 DIAGNOSIS — S52615A Nondisplaced fracture of left ulna styloid process, initial encounter for closed fracture: Secondary | ICD-10-CM | POA: Diagnosis not present

## 2018-03-03 DIAGNOSIS — S52502A Unspecified fracture of the lower end of left radius, initial encounter for closed fracture: Secondary | ICD-10-CM | POA: Diagnosis not present

## 2018-03-07 ENCOUNTER — Other Ambulatory Visit: Payer: Self-pay

## 2018-03-08 ENCOUNTER — Encounter: Payer: Self-pay | Admitting: Family Medicine

## 2018-03-16 DIAGNOSIS — S52502A Unspecified fracture of the lower end of left radius, initial encounter for closed fracture: Secondary | ICD-10-CM | POA: Diagnosis not present

## 2018-03-23 ENCOUNTER — Telehealth: Payer: Self-pay | Admitting: Family Medicine

## 2018-03-23 ENCOUNTER — Other Ambulatory Visit: Payer: Self-pay | Admitting: Family Medicine

## 2018-03-23 NOTE — Telephone Encounter (Signed)
Noted that Triamcinolone cream was previously pended and sent to Provider today at 11:20 AM.

## 2018-03-23 NOTE — Telephone Encounter (Signed)
Copied from CRM [id number]. Topic: Quick Communication - Rx Refill/Question >> Mar 23, 2018 11:17 AM Gloriann Loan L wrote: Medication: triamcinolone cream (KENALOG) 0.1 %  Has the patient contacted their pharmacy? Yes.   (Agent: If no, request that the patient contact the pharmacy for the refill.) (Agent: If yes, when and what did the pharmacy advise?) pt called pharmacy today and pt decided to call us after that  Preferred Pharmacy (with phone number or street name):   [facility] [address]), [place] - [address] [phone] (Phone) [phone] (Fax)    Agent: Please be advised that RX refills may take up to 3 business days. We ask that you follow-up with your pharmacy.

## 2018-03-23 NOTE — Telephone Encounter (Signed)
Requested medication (s) are due for refill today - unsure if short term or patient to continue  Requested medication (s) are on the active medication list - yes  Future visit scheduled -no  Last refill: 02/18/18  Notes to clinic: Patient was given cream for rash- not sure if patient to use continually- sent for PCP review.  Requested Prescriptions  Pending Prescriptions Disp Refills   triamcinolone cream (KENALOG) 0.1 % [Pharmacy Med Name: Triamcinolone Acetonide 0.1 % External Cream] 60 g 0    Sig: APPLY 1 APPLICATION TOPICALLY TWICE DAILY     Dermatology:  Corticosteroids Passed - 03/23/2018 11:14 AM      Passed - Valid encounter within last 12 months    Recent Outpatient Visits          4 weeks ago Left wrist pain   [facility] Particia Nearing, PA-C   1 month ago Severe episode of recurrent major depressive disorder, without psychotic features [facility])   [facility] Roosvelt Maser [place], [place]   3 months ago Panic disorder   [facility] Roosvelt Maser [place], [place]              Requested Prescriptions  Pending Prescriptions Disp Refills   triamcinolone cream (KENALOG) 0.1 % [Pharmacy Med Name: Triamcinolone Acetonide 0.1 % External Cream] 60 g 0    Sig: APPLY 1 APPLICATION TOPICALLY TWICE DAILY     Dermatology:  Corticosteroids Passed - 03/23/2018 11:14 AM      Passed - Valid encounter within last 12 months    Recent Outpatient Visits          4 weeks ago Left wrist pain   [facility] Particia Nearing, PA-C   1 month ago Severe episode of recurrent major depressive disorder, without psychotic features [facility])   [facility] Roosvelt Maser [place], [place]   3 months ago Panic disorder   [facility] Roosvelt Maser [place], [place]

## 2018-04-12 DIAGNOSIS — S52502A Unspecified fracture of the lower end of left radius, initial encounter for closed fracture: Secondary | ICD-10-CM | POA: Diagnosis not present

## 2018-04-14 DIAGNOSIS — S52502D Unspecified fracture of the lower end of left radius, subsequent encounter for closed fracture with routine healing: Secondary | ICD-10-CM | POA: Diagnosis not present

## 2018-04-29 ENCOUNTER — Telehealth: Payer: Self-pay | Admitting: Family Medicine

## 2018-04-29 NOTE — Telephone Encounter (Unsigned)
Copied from CRM [id number]. Topic: Quick Communication - Rx Refill/Question >> Apr 29, 2018  1:12 PM Crist Infante wrote: Medication: tessalon pearls  Pt states she has had a cough 4 days, been taking OTC not working.  Would like to know if Fleet Contras will call this in for her? Pt had wanted appt today, but nothing available.  [facility] [address]), [place] - [address] [phone] (Phone) [phone] (Fax)

## 2018-04-29 NOTE — Telephone Encounter (Signed)
Called and spoke with patient. Message relayed. Pt stated she will call to schedule an OV.

## 2018-04-29 NOTE — Telephone Encounter (Signed)
Can take delsym until she is able to be seen

## 2018-05-05 NOTE — Telephone Encounter (Signed)
Patient is requesting a script be sent to [facility] [address] for the following   Quetiapine 25mg  Quetiapine 50mg   Tramadol 50mg  she is requesting 25-30 tabs until her appt with pain clinic  She has an appointment with the pain provider on 3/30.   Thank You

## 2018-05-10 ENCOUNTER — Ambulatory Visit: Payer: Medicaid Other | Admitting: Nurse Practitioner

## 2018-05-10 ENCOUNTER — Encounter: Payer: Self-pay | Admitting: Nurse Practitioner

## 2018-05-10 VITALS — BP 145/76 | HR 88 | Temp 98.2°F | Ht 63.0 in | Wt 121.0 lb

## 2018-05-10 DIAGNOSIS — J4520 Mild intermittent asthma, uncomplicated: Secondary | ICD-10-CM

## 2018-05-10 DIAGNOSIS — Z23 Encounter for immunization: Secondary | ICD-10-CM | POA: Diagnosis not present

## 2018-05-10 DIAGNOSIS — M1990 Unspecified osteoarthritis, unspecified site: Secondary | ICD-10-CM | POA: Diagnosis not present

## 2018-05-10 DIAGNOSIS — F410 Panic disorder [episodic paroxysmal anxiety] without agoraphobia: Secondary | ICD-10-CM | POA: Diagnosis not present

## 2018-05-10 MED ORDER — HYDROCOD POLST-CPM POLST ER 10-8 MG/5ML PO SUER: 5.0000 mL | Freq: Two times a day (BID) | ORAL | 0 refills | Status: DC | PRN

## 2018-05-10 MED ORDER — VENLAFAXINE HCL 100 MG PO TABS: 100.0000 mg | ORAL_TABLET | Freq: Every day | ORAL | 5 refills | Status: DC

## 2018-05-10 MED ORDER — PREDNISONE 10 MG PO TABS: 30.0000 mg | ORAL_TABLET | Freq: Every day | ORAL | 0 refills | Status: AC

## 2018-05-10 NOTE — Progress Notes (Signed)
BP (!) 145/76 (BP Location: Right Arm, Patient Position: Sitting, Cuff Size: Normal)   Pulse 88   Temp 98.2 F (36.8 C)   Ht  (1.6 m)   Wt 121 lb (54.9 kg)   SpO2 97%   BMI 21.43 kg/m    Subjective:    Patient ID: Karen Foster, female    DOB: 03-Aug-1960, 58 y.o.   MRN: [id number]  HPI: HELAYNE Foster is a 58 y.o. female  Chief Complaint  Patient presents with  . Cough    wheezing, patient has been using her inhaler, patient would like a script for cheratussin   . Panic Attack    Patient would like to be switched to a different anxiety medication  . Cough    wrist, patient would like a script for tylenol 3   COUGH Has underlying reactive airway disease, on Albuterol. Everyday smoker "about 3 a day". Currently uses her Albuterol as needed, once or twice week; currently a "little more" due to cough.   Cough started on Friday.  No recent abx or exacerbation. Duration: since Friday Circumstances of initial development of cough: URI Cough severity: moderate during day and severe at night Cough description: non-productive Aggravating factors:  worse at night Alleviating factors: none Status:  worse Treatments attempted: cough syrup and albuterol Wheezing: yes Shortness of breath: no Chest pain: has this with anxiety attacks Chest tightness:no Nasal congestion: yes Runny nose: no Postnasal drip: no Frequent throat clearing or swallowing: yes Hemoptysis: no Fevers: no Night sweats: no Weight loss: no Heartburn: no Recent foreign travel: no Tuberculosis contacts: no   ANXIETY/STRESS Reports only things that worked for her in past were Xanax and Tranxene. States she does not want to restart Xanax, but wants to be prescribed Tranxene.  Discussed at length risks of this medication and benzo family, especially in conjunction with opioid.  Pt made aware of risks of benzo medication use to include increased sedation, respiratory suppression, falls, ,  dependence and  cardiovascular events; risks increased in conjunction with opioid.  Discussed alternate options such as Buspar, which she does not wish to try.  Reports she is allergic to Hydroxyzine.  States her current job is "tough and stressful, you do not understand, I have panic attacks".  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  She became upset with provider stating "well, you prescribe the Tranxene to the person I care for and she is older".  Discussed that the scenarios are different, they have been on medication for a long time (prior to the provider presence in office) + risk vs benefit conversations have been had with the patient.  She again stated "I don't understand why you and the my other provider can not prescribe this".  Provider again reiterated above conversation.  Discussed alternative option, as Effexor is not at max dose.  Discussed increasing this dose, she initially said "I don't know why it is not working anyway and neither is the Seroquel".  Discussed that increasing dose may add benefit and it would be safer option vs adding benzo.  Offered psychiatry referral, she declined this stating "I barely have time to come see all of you".  She denies SI/HI. Duration:worse Anxious mood: yes  Excessive worrying: yes Irritability: yes  Sweating:  Nausea: no Palpitations:no Hyperventilation: no Panic attacks: yes Agoraphobia: no  Obscessions/compulsions: no Depressed mood: yes Depression screen [facility] 2/9 02/18/2018 12/03/2017  Decreased Interest 2 1  Down,  Depressed, Hopeless 1 2  PHQ - 2 Score 3 3  Altered sleeping 2 3  Tired, decreased energy 2 2  Change in appetite 2 2  Feeling bad or failure about yourself  1 2  Trouble concentrating 2 2  Moving slowly or fidgety/restless 2 2  Suicidal thoughts 0 0  PHQ-9 Score 14 16  Anhedonia: no Weight changes: no Insomnia: yes hard to fall asleep  Hypersomnia: no Fatigue/loss of energy:  yes Feelings of worthlessness: no Feelings of guilt: no Impaired concentration/indecisiveness: yes Suicidal ideations: no  Crying spells: yes Recent Stressors/Life Changes: yes   Relationship problems: no   Family stress: no     Financial stress: yes    Job stress: yes    Recent death/loss: no  LEFT WRIST PAIN: She is scheduled to see pain clinic in [place] on 05/03/18.  At visit today she is requesting refills on her Tylenol #3 that she takes daily.  Last refill on review of PMP was 04/14/18 by her previous provider Dr. Hyacinth Meeker.  Discussed with her that as she is scheduled to see pain clinic and appears to have been on this medication for longer period, that this would not be prescribed today.  She became upset with this stating "I don't understand why it was given to me before".  Discussed the risks of long term opioid use with patient and that for chronic pain issues the safest direction to take is collaboration with pain clinic, who can safely determine other options or if needed prescribe opioids and monitor closely.  She is allergic to Ibuprofen, was prescribed Voltaren gel which she reports she did not obtain from pharmacy stating "I heard it needs to be approved for me"; staff submitted PA today and this was approved.  Patient is aware to pick up gel at this time.   Relevant past medical, surgical, family and social history reviewed and updated as indicated. Interim medical history since our last visit reviewed. Allergies and medications reviewed and updated.  Review of Systems  Constitutional: Positive for fatigue. Negative for activity change, appetite change, diaphoresis and fever.  HENT: Positive for congestion. Negative for ear discharge, ear pain, postnasal drip, rhinorrhea, sinus pressure, sinus pain, sneezing, sore throat and voice change.   Respiratory: Positive for cough. Negative for chest tightness and shortness of breath.   Cardiovascular: Negative for chest pain, palpitations  and leg swelling.  Gastrointestinal: Negative for abdominal distention, abdominal pain, constipation, diarrhea, nausea and vomiting.  Endocrine: Negative for cold intolerance, heat intolerance, polydipsia, polyphagia and polyuria.  Neurological: Negative for dizziness, syncope, weakness, light-headedness, numbness and headaches.  Psychiatric/Behavioral: Positive for decreased concentration and sleep disturbance. Negative for self-injury and suicidal ideas. The patient is nervous/anxious.     Per HPI unless specifically indicated above     Objective:    BP (!) 145/76 (BP Location: Right Arm, Patient Position: Sitting, Cuff Size: Normal)   Pulse 88   Temp 98.2 F (36.8 C)   Ht 5\' 3"  (1.6 m)   Wt 121 lb (54.9 kg)   SpO2 97%   BMI 21.43 kg/m   Wt Readings from Last 3 Encounters:  05/10/18 121 lb (54.9 kg)  02/22/18 121 lb (54.9 kg)  02/22/18 121 lb (54.9 kg)    Physical Exam Vitals signs and nursing note reviewed.  Constitutional:      General: She is awake.     Appearance: She is well-developed. She is not ill-appearing.  HENT:     Head: Normocephalic.  Right Ear: Hearing, tympanic membrane, ear canal and external ear normal.     Left Ear: Hearing, tympanic membrane, ear canal and external ear normal.     Nose: Rhinorrhea present. No mucosal edema. Rhinorrhea is clear.     Right Sinus: No maxillary sinus tenderness or frontal sinus tenderness.     Left Sinus: No maxillary sinus tenderness or frontal sinus tenderness.     Mouth/Throat:     Mouth: Mucous membranes are moist.     Pharynx: Oropharynx is clear. Posterior oropharyngeal erythema (mild with cobblestoning) present. No pharyngeal swelling or oropharyngeal exudate.  Eyes:     General:        Right eye: No discharge.        Left eye: No discharge.     Conjunctiva/sclera: Conjunctivae normal.     Pupils: Pupils are equal, round, and reactive to light.  Neck:     Musculoskeletal: Normal range of motion and neck  supple.     Thyroid: No thyromegaly.     Vascular: No carotid bruit or JVD.  Cardiovascular:     Rate and Rhythm: Normal rate and regular rhythm.     Heart sounds: Normal heart sounds. No murmur. No gallop.   Pulmonary:     Effort: Pulmonary effort is normal. No accessory muscle usage.     Breath sounds: Examination of the right-upper field reveals wheezing. Examination of the left-upper field reveals wheezing. Examination of the right-lower field reveals wheezing. Examination of the left-lower field reveals wheezing. Wheezing present.     Comments: Expiratory intermittent fine wheezes noted throughout.  No use of accessory muscles and no Tripod position.  Regular respiratory rate (18). Abdominal:     General: Bowel sounds are normal.     Palpations: Abdomen is soft.  Musculoskeletal:     Right lower leg: No edema.     Left lower leg: No edema.  Lymphadenopathy:     Cervical: No cervical adenopathy.  Skin:    General: Skin is warm and dry.  Neurological:     Mental Status: She is alert and oriented to person, place, and time.  Psychiatric:        Attention and Perception: Attention normal.        Mood and Affect: Affect is angry.        Speech: Speech normal.        Behavior: Behavior normal. Behavior is cooperative.        Thought Content: Thought content normal.        Judgment: Judgment normal.     Comments: Intermittent episodes of angry affect.     Results for orders placed or performed in visit on 02/18/18  HgB A1c  Result Value Ref Range   Hgb A1c MFr Bld 5.3 4.8 - 5.6 %   Est. average glucose Bld gHb Est-mCnc 105 mg/dL  Basic Metabolic Panel (BMET)  Result Value Ref Range   Glucose 90 65 - 99 mg/dL   BUN 20 6 - 24 mg/dL   Creatinine, Ser 4.09 0.57 - 1.00 mg/dL   GFR calc non Af Amer 81 >59 mL/min/1.73   GFR calc Af Amer 94 >59 mL/min/1.73   BUN/Creatinine Ratio 25 (H) 9 - 23   Sodium 140 134 - 144 mmol/L   Potassium 4.0 3.5 - 5.2 mmol/L   Chloride 104 96 - 106  mmol/L   CO2 22 20 - 29 mmol/L   Calcium 9.0 8.7 - 10.2 mg/dL  UA/M w/rflx Culture, Routine  Result Value Ref Range   Specific Gravity, UA 1.025 1.005 - 1.030   pH, UA 5.5 5.0 - 7.5   Color, UA Yellow Yellow   Appearance Ur Clear Clear   Leukocytes, UA Negative Negative   Protein, UA Negative Negative/Trace   Glucose, UA Negative Negative   Ketones, UA Negative Negative   RBC, UA Negative Negative   Bilirubin, UA Negative Negative   Urobilinogen, Ur 0.2 0.2 - 1.0 mg/dL   Nitrite, UA Negative Negative      Assessment & Plan:   Problem List Items Addressed This Visit      Respiratory   Reactive airway disease    With current acute cough starting 4 days ago.  Scripts for Prednisone and Tussionex sent.  Discussed at length with patient script use.  Continue to use Albuterol as needed.  Consider addition of maintenance inhaler in future.  Recommend complete smoking cessation.  Return for worsening or continued issues.        Musculoskeletal and Integument   Arthritis    Has upcoming appointment with pain clinic 05/30/18.  No Tylenol # 3 refills this visit, discussed at length with patient.  Recommend use of Diclofenac gel.      Relevant Medications   predniSONE (DELTASONE) 10 MG tablet     Other   Panic disorder - Primary    Will increase Effexor at this time to 100 MG daily, monitor BP with use and if elevation consider either transition to Sertraline or psychiatry referral.  Discussed at length Tranxene would not be started, offered alternate option Buspar or psychiatry referral; both were decline.  Return in 4 weeks to see PCP.      Relevant Medications   venlafaxine (EFFEXOR) 100 MG tablet      Controlled substance.  Checked data base and no other refills of controlled substances noted.  Of note her last Tylenol #3 script was on 04/14/18 by Dr. Arvilla Market.  Time: 25 minutes, >50% spent counseling on opioid and benzo risks  Follow up plan: Return in about 4 weeks (around  06/07/2018) for Follow-up mood with Fleet Contras.

## 2018-05-10 NOTE — Assessment & Plan Note (Signed)
Has upcoming appointment with pain clinic 05/30/18.  No Tylenol # 3 refills this visit, discussed at length with patient.  Recommend use of Diclofenac gel.

## 2018-05-10 NOTE — Assessment & Plan Note (Signed)
With current acute cough starting 4 days ago.  Scripts for Prednisone and Tussionex sent.  Discussed at length with patient script use.  Continue to use Albuterol as needed.  Consider addition of maintenance inhaler in future.  Recommend complete smoking cessation.  Return for worsening or continued issues.

## 2018-05-10 NOTE — Assessment & Plan Note (Addendum)
Will increase Effexor at this time to 100 MG daily, monitor BP with use and if elevation consider either transition to Sertraline or psychiatry referral.  Discussed at length Tranxene would not be started, offered alternate option Buspar or psychiatry referral; both were decline.  Return in 4 weeks to see PCP.

## 2018-05-10 NOTE — Patient Instructions (Signed)
Chronic Obstructive Pulmonary Disease Chronic obstructive pulmonary disease (COPD) is a long-term (chronic) lung problem. When you have COPD, it is hard for air to get in and out of your lungs. Usually the condition gets worse over time, and your lungs will never return to normal. There are things you can do to keep yourself as healthy as possible.  Your doctor may treat your condition with: ? Medicines. ? Oxygen. ? Lung surgery.  Your doctor may also recommend: ? Rehabilitation. This includes steps to make your body work better. It may involve a team of specialists. ? Quitting smoking, if you smoke. ? Exercise and changes to your diet. ? Comfort measures (palliative care). Follow these instructions at home: Medicines  Take over-the-counter and prescription medicines only as told by your doctor.  Talk to your doctor before taking any cough or allergy medicines. You may need to avoid medicines that cause your lungs to be dry. Lifestyle  If you smoke, stop. Smoking makes the problem worse. If you need help quitting, ask your doctor.  Avoid being around things that make your breathing worse. This may include smoke, chemicals, and fumes.  Stay active, but remember to rest as well.  Learn and use tips on how to relax.  Make sure you get enough sleep. Most adults need at least 7 hours of sleep every night.  Eat healthy foods. Eat smaller meals more often. Rest before meals. Controlled breathing Learn and use tips on how to control your breathing as told by your doctor. Try:  Breathing in (inhaling) through your nose for 1 second. Then, pucker your lips and breath out (exhale) through your lips for 2 seconds.  Putting one hand on your belly (abdomen). Breathe in slowly through your nose for 1 second. Your hand on your belly should move out. Pucker your lips and breathe out slowly through your lips. Your hand on your belly should move in as you breathe out.  Controlled coughing Learn  and use controlled coughing to clear mucus from your lungs. Follow these steps: 1. Lean your head a little forward. 2. Breathe in deeply. 3. Try to hold your breath for 3 seconds. 4. Keep your mouth slightly open while coughing 2 times. 5. Spit any mucus out into a tissue. 6. Rest and do the steps again 1 or 2 times as needed. General instructions  Make sure you get all the shots (vaccines) that your doctor recommends. Ask your doctor about a flu shot and a pneumonia shot.  Use oxygen therapy and pulmonary rehabilitation if told by your doctor. If you need home oxygen therapy, ask your doctor if you should buy a tool to measure your oxygen level (oximeter).  Make a COPD action plan with your doctor. This helps you to know what to do if you feel worse than usual.  Manage any other conditions you have as told by your doctor.  Avoid going outside when it is very hot, cold, or humid.  Avoid people who have a sickness you can catch (contagious).  Keep all follow-up visits as told by your doctor. This is important. Contact a doctor if:  You cough up more mucus than usual.  There is a change in the color or thickness of the mucus.  It is harder to breathe than usual.  Your breathing is faster than usual.  You have trouble sleeping.  You need to use your medicines more often than usual.  You have trouble doing your normal activities such as getting dressed   or walking around the house. Get help right away if:  You have shortness of breath while resting.  You have shortness of breath that stops you from: ? Being able to talk. ? Doing normal activities.  Your chest hurts for longer than 5 minutes.  Your skin color is more blue than usual.  Your pulse oximeter shows that you have low oxygen for longer than 5 minutes.  You have a fever.  You feel too tired to breathe normally. Summary  Chronic obstructive pulmonary disease (COPD) is a long-term lung problem.  The way your  lungs work will never return to normal. Usually the condition gets worse over time. There are things you can do to keep yourself as healthy as possible.  Take over-the-counter and prescription medicines only as told by your doctor.  If you smoke, stop. Smoking makes the problem worse. This information is not intended to replace advice given to you by your health care provider. Make sure you discuss any questions you have with your health care provider. Document Released: 08/05/2007 Document Revised: 03/23/2016 Document Reviewed: 03/23/2016 Elsevier Interactive Patient Education  2019 [employer]  

## 2018-05-11 ENCOUNTER — Ambulatory Visit: Payer: Self-pay | Admitting: Family Medicine

## 2018-05-11 ENCOUNTER — Other Ambulatory Visit: Payer: Self-pay | Admitting: Nurse Practitioner

## 2018-05-11 MED ORDER — BENZONATATE 200 MG PO CAPS: 200.0000 mg | ORAL_CAPSULE | Freq: Two times a day (BID) | ORAL | 0 refills | Status: DC | PRN

## 2018-05-11 NOTE — Progress Notes (Signed)
Patient request for Tessalon.  Order sent.

## 2018-05-11 NOTE — Telephone Encounter (Signed)
  See  Triage notes.  I sent these triage notes to Roosvelt Maser, PA-C for further disposition.   Reason for Disposition . Caller has NON-URGENT medication question about med that PCP prescribed and triager unable to answer question    Cough medicine making her sleepy.   Requesting Tessalon Perles.  Answer Assessment - Initial Assessment Questions 1. SYMPTOMS: "Do you have any symptoms?"     The cough medicine is making me so sleepy.   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  Tussionex Pennkinetic ER.    Tessalon Perles is requested.  I got my 2nd Shingles shot right arm.   Last night it hurt so bad.  I took some Tylenol and put warm compresses on it.   It feels better now.    2. SEVERITY: If symptoms are present, ask "Are they mild, moderate or severe?"     I was so sleepy last night.   Maybe from the Shingles shot and the cough medicine together.    I have COPD and still smoke.  I want to quit.   I talked with Evalyn Casco about it yesterday.      My medicaid will pay for a generic to help me quit smoking.   Will Roosvelt Maser prescribe something to help me stop smoking?  Protocols used: MEDICATION QUESTION CALL-A-AH

## 2018-05-11 NOTE — Telephone Encounter (Signed)
Can we alert Karen Foster to pt request for medication to quit smoking.  She is stating Karen Foster talked to her about this.

## 2018-05-13 NOTE — Telephone Encounter (Signed)
I did not see her this week,Karen Foster did but on speaking with her smoking cessation medication was not specifically addressed. Will need OV to discuss

## 2018-05-13 NOTE — Telephone Encounter (Signed)
Called pt to let her know she would need an office visit states that she will discuss at ov on 4/8 @ 1:30

## 2018-05-19 ENCOUNTER — Other Ambulatory Visit: Payer: Self-pay | Admitting: Nurse Practitioner

## 2018-05-19 NOTE — Telephone Encounter (Signed)
Pt has appt 06/08/2018 with R. Maurice March

## 2018-05-25 ENCOUNTER — Other Ambulatory Visit: Payer: Self-pay | Admitting: Nurse Practitioner

## 2018-05-30 DIAGNOSIS — G8929 Other chronic pain: Secondary | ICD-10-CM | POA: Diagnosis not present

## 2018-05-30 DIAGNOSIS — M1990 Unspecified osteoarthritis, unspecified site: Secondary | ICD-10-CM | POA: Diagnosis not present

## 2018-05-30 DIAGNOSIS — S72002S Fracture of unspecified part of neck of left femur, sequela: Secondary | ICD-10-CM | POA: Diagnosis not present

## 2018-05-30 DIAGNOSIS — R51 Headache: Secondary | ICD-10-CM | POA: Diagnosis not present

## 2018-05-30 DIAGNOSIS — M79609 Pain in unspecified limb: Secondary | ICD-10-CM | POA: Diagnosis not present

## 2018-05-30 DIAGNOSIS — G90512 Complex regional pain syndrome I of left upper limb: Secondary | ICD-10-CM | POA: Diagnosis not present

## 2018-05-30 DIAGNOSIS — M792 Neuralgia and neuritis, unspecified: Secondary | ICD-10-CM | POA: Diagnosis not present

## 2018-05-30 DIAGNOSIS — M9931 Osseous stenosis of neural canal of cervical region: Secondary | ICD-10-CM | POA: Diagnosis not present

## 2018-05-30 DIAGNOSIS — M9951 Intervertebral disc stenosis of neural canal of cervical region: Secondary | ICD-10-CM | POA: Diagnosis not present

## 2018-05-30 DIAGNOSIS — G894 Chronic pain syndrome: Secondary | ICD-10-CM | POA: Diagnosis not present

## 2018-05-30 DIAGNOSIS — M797 Fibromyalgia: Secondary | ICD-10-CM | POA: Diagnosis not present

## 2018-06-06 ENCOUNTER — Other Ambulatory Visit: Payer: Self-pay | Admitting: Nurse Practitioner

## 2018-06-07 ENCOUNTER — Telehealth: Payer: Self-pay | Admitting: Family Medicine

## 2018-06-07 NOTE — Telephone Encounter (Signed)
Patient does not have smart phone can you just do a phone visit or do I need to reschedule her to come in the office when you are in.  Please advise.  Thank you

## 2018-06-07 NOTE — Telephone Encounter (Signed)
Phone is fine

## 2018-06-07 NOTE — Telephone Encounter (Signed)
LVM to be expecting a phone call from CMA for appointment over the phone with Fleet Contras 4/8   Just FYI

## 2018-06-08 ENCOUNTER — Ambulatory Visit (INDEPENDENT_AMBULATORY_CARE_PROVIDER_SITE_OTHER): Payer: Medicaid Other | Admitting: Family Medicine

## 2018-06-08 ENCOUNTER — Telehealth: Payer: Self-pay | Admitting: Family Medicine

## 2018-06-08 ENCOUNTER — Encounter: Payer: Self-pay | Admitting: Family Medicine

## 2018-06-08 ENCOUNTER — Other Ambulatory Visit: Payer: Self-pay

## 2018-06-08 VITALS — Ht 65.0 in | Wt 120.0 lb

## 2018-06-08 DIAGNOSIS — J4540 Moderate persistent asthma, uncomplicated: Secondary | ICD-10-CM | POA: Diagnosis not present

## 2018-06-08 DIAGNOSIS — F332 Major depressive disorder, recurrent severe without psychotic features: Secondary | ICD-10-CM | POA: Diagnosis not present

## 2018-06-08 DIAGNOSIS — R05 Cough: Secondary | ICD-10-CM | POA: Diagnosis not present

## 2018-06-08 DIAGNOSIS — F410 Panic disorder [episodic paroxysmal anxiety] without agoraphobia: Secondary | ICD-10-CM

## 2018-06-08 DIAGNOSIS — R059 Cough, unspecified: Secondary | ICD-10-CM

## 2018-06-08 DIAGNOSIS — F17210 Nicotine dependence, cigarettes, uncomplicated: Secondary | ICD-10-CM

## 2018-06-08 MED ORDER — HYDROCOD POLST-CPM POLST ER 10-8 MG/5ML PO SUER: 5.0000 mL | Freq: Two times a day (BID) | ORAL | 0 refills | Status: DC | PRN

## 2018-06-08 MED ORDER — NICOTINE POLACRILEX 4 MG MT GUM: 4.0000 mg | CHEWING_GUM | OROMUCOSAL | 0 refills | Status: DC | PRN

## 2018-06-08 MED ORDER — MONTELUKAST SODIUM 10 MG PO TABS: 10.0000 mg | ORAL_TABLET | Freq: Every day | ORAL | 3 refills | Status: DC

## 2018-06-08 MED ORDER — QUETIAPINE FUMARATE 25 MG PO TABS: ORAL_TABLET | ORAL | 1 refills | Status: DC

## 2018-06-08 MED ORDER — BUDESONIDE-FORMOTEROL FUMARATE 160-4.5 MCG/ACT IN AERO: 2.0000 | INHALATION_SPRAY | Freq: Two times a day (BID) | RESPIRATORY_TRACT | 3 refills | Status: DC

## 2018-06-08 MED ORDER — ALBUTEROL SULFATE HFA 108 (90 BASE) MCG/ACT IN AERS: 2.0000 | INHALATION_SPRAY | Freq: Four times a day (QID) | RESPIRATORY_TRACT | 3 refills | Status: DC | PRN

## 2018-06-08 MED ORDER — QUETIAPINE FUMARATE 50 MG PO TABS: ORAL_TABLET | ORAL | 1 refills | Status: DC

## 2018-06-08 MED ORDER — DULOXETINE HCL 60 MG PO CPEP: 60.0000 mg | ORAL_CAPSULE | Freq: Every day | ORAL | 0 refills | Status: DC

## 2018-06-08 NOTE — Telephone Encounter (Signed)
Rx sent 

## 2018-06-08 NOTE — Telephone Encounter (Signed)
Copied from CRM [id number]. Topic: Quick Communication - Rx Refill/Question >> Jun 08, 2018  3:14 PM Wyonia Hough E wrote: Reason for CRM: Pt wants to quit smoking and was told by her pharmacy if an Rx for Nicorette gum was called in that her insurance will cover it/ please advise

## 2018-06-08 NOTE — Assessment & Plan Note (Signed)
Improved on higher dose of effexor along with seroquel, but having dry mouth now. Will switch to cymbalta and monitor closely for improvement in side effects. F/u in 1 month.

## 2018-06-08 NOTE — Progress Notes (Signed)
Ht 5\' 5"  (1.651 m)   Wt 120 lb (54.4 kg)   BMI 19.97 kg/m    Subjective:    Patient ID: Karen Foster, female    DOB: 07/10/1960, 58 y.o.   MRN: [id number]  HPI: Karen Foster is a 58 y.o. female  Chief Complaint  Patient presents with  . Panic Attack    1 month F/U. Taking care of an elderly couple. Patient stated things have changed and she's worried she'll do something wrong.  . Cough    Patient states that she needs a refill on Tussinex. Taking Tessalon Pearls during the day and the cough syrup at night. States the weather and pollen is messing with her COPD.    . This visit was completed via telephone due to the restrictions of the COVID-19 pandemic. All issues as above were discussed and addressed but no physical exam was performed. If it was felt that the patient should be evaluated in the office, they were directed there. The patient verbally consented to this visit. Patient was unable to complete an audio/visual visit due to Technical difficulties,Lack of internet. Due to the catastrophic nature of the COVID-19 pandemic, this visit was done through audio contact only. . Location of the patient: home . Location of the provider: home . Those involved with this call:  . Provider: Roosvelt Maserachel Lane, PA-C . CMA: Myrtha MantisKeri Bullock, CMA . Front Desk/Registration: Harriet PhoJoliza Johnson  . Time spent on call: 25 minutes with patient face to face via video conference. More than 50% of this time was spent in counseling and coordination of care.  Here today for 1 month anxiety and cough f/u.   Does note a benefit with the increased effexor dose, but having significant dry mouth with it. Takes 2 tabs of 25 mg seroquel at daytime and 1 tab 50 mg seroquel at bedtime and that's working very well in addition. 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 Denies SI/HI, severe mood swings.   Still coughing  pretty much all day long, states it's from allergies and her COPD. Albuterol prn helps, and also taking tessalon for daytime cough and prn tussionex at bedtime which helps quite a bit.    Relevant past medical, surgical, family and social history reviewed and updated as indicated. Interim medical history since our last visit reviewed. Allergies and medications reviewed and updated.  Review of Systems  Per HPI unless specifically indicated above     Objective:    Ht 5\' 5"  (1.651 m)   Wt 120 lb (54.4 kg)   BMI 19.97 kg/m   Wt Readings from Last 3 Encounters:  06/08/18 120 lb (54.4 kg)  05/10/18 121 lb (54.9 kg)  02/22/18 121 lb (54.9 kg)    Physical Exam  Results for orders placed or performed in visit on 02/18/18  HgB A1c  Result Value Ref Range   Hgb A1c MFr Bld 5.3 4.8 - 5.6 %   Est. average glucose Bld gHb Est-mCnc 105 mg/dL  Basic Metabolic Panel (BMET)  Result Value Ref Range   Glucose 90 65 - 99 mg/dL   BUN 20 6 - 24 mg/dL   Creatinine, Ser 4.090.81 0.57 - 1.00 mg/dL   GFR calc non Af Amer 81 >59 mL/min/1.73   GFR calc Af Amer 94 >59 mL/min/1.73   BUN/Creatinine Ratio 25 (H) 9 - 23   Sodium 140 134 -  144 mmol/L   Potassium 4.0 3.5 - 5.2 mmol/L   Chloride 104 96 - 106 mmol/L   CO2 22 20 - 29 mmol/L   Calcium 9.0 8.7 - 10.2 mg/dL  UA/M w/rflx Culture, Routine  Result Value Ref Range   Specific Gravity, UA 1.025 1.005 - 1.030   pH, UA 5.5 5.0 - 7.5   Color, UA Yellow Yellow   Appearance Ur Clear Clear   Leukocytes, UA Negative Negative   Protein, UA Negative Negative/Trace   Glucose, UA Negative Negative   Ketones, UA Negative Negative   RBC, UA Negative Negative   Bilirubin, UA Negative Negative   Urobilinogen, Ur 0.2 0.2 - 1.0 mg/dL   Nitrite, UA Negative Negative      Assessment & Plan:   Problem List Items Addressed This Visit      Respiratory   Reactive airway disease - Primary    With persistent cough - will add symbicort BID to albuterol prn and  start singulair. Work on smoking cessation additionally. F/u in 1 month for recheck        Other   Panic disorder    Improved on higher dose of effexor along with seroquel, but having dry mouth now. Will switch to cymbalta and monitor closely for improvement in side effects. F/u in 1 month.       Relevant Medications   DULoxetine (CYMBALTA) 60 MG capsule   Depression    Stable on seroquel, switching from effexor to cymbalta due to side effects      Relevant Medications   DULoxetine (CYMBALTA) 60 MG capsule   Cigarette smoker    Will start nicorette gum as it's covered by insurance. Counseling, habit replacement, avoiding close contacts who also smoke reviewed. Greater than 5 min spent in cessation counseling today       Other Visit Diagnoses    Cough       Continue tessalon prn, small amnt of tussionex sent for severe prn episodes. Precautions reviewed. Hoping inhalers, allergy control,  and smoking cessation help       Follow up plan: Return in about 4 weeks (around 07/06/2018) for anxiety f/u.

## 2018-06-08 NOTE — Assessment & Plan Note (Signed)
Will start nicorette gum as it's covered by insurance. Counseling, habit replacement, avoiding close contacts who also smoke reviewed. Greater than 5 min spent in cessation counseling today

## 2018-06-08 NOTE — Assessment & Plan Note (Signed)
With persistent cough - will add symbicort BID to albuterol prn and start singulair. Work on smoking cessation additionally. F/u in 1 month for recheck

## 2018-06-08 NOTE — Assessment & Plan Note (Signed)
Stable on seroquel, switching from effexor to cymbalta due to side effects

## 2018-06-15 ENCOUNTER — Telehealth: Payer: Self-pay | Admitting: Nurse Practitioner

## 2018-06-15 NOTE — Telephone Encounter (Signed)
Scheduled telephone visit for 4/16 am

## 2018-06-15 NOTE — Telephone Encounter (Signed)
Requested medication (s) are due for refill today: Yes  Requested medication (s) are on the active medication list: No  Last refill:  05/10/18  Future visit scheduled: Yes  Notes to clinic:  See request    Requested Prescriptions  Pending Prescriptions Disp Refills   predniSONE (DELTASONE) 10 MG tablet [Pharmacy Med Name: predniSONE 10 MG Oral Tablet] 15 tablet 0    Sig: TAKE 3 TABLETS BY MOUTH DAILY WITH BREAKFAST FOR 5 DAYS     Not Delegated - Endocrinology:  Oral Corticosteroids Failed - 06/15/2018 12:49 PM      Failed - This refill cannot be delegated      Failed - Last BP in normal range    BP Readings from Last 1 Encounters:  05/10/18 (!) 145/76         Passed - Valid encounter within last 6 months    Recent Outpatient Visits          1 week ago Moderate persistent reactive airway disease without complication   [facility] [place], Salley Hews, PA-C   1 month ago Panic disorder   [facility] [place], [place] T, NP   3 months ago Left wrist pain   [facility] [place], [place], [place]   3 months ago Severe episode of recurrent major depressive disorder, without psychotic features [facility])   [facility] Roosvelt Maser [place], [place]   6 months ago Panic disorder   [facility] [place], Salley Hews, [place]      Future Appointments            In 3 weeks Maurice March, Salley Hews, PA-C [facility]

## 2018-06-15 NOTE — Telephone Encounter (Signed)
Called pt back no answer  

## 2018-06-15 NOTE — Telephone Encounter (Signed)
Pt called back to return call to [place].

## 2018-06-15 NOTE — Telephone Encounter (Signed)
Called pt no answer left voice mail to callback

## 2018-06-16 ENCOUNTER — Ambulatory Visit (INDEPENDENT_AMBULATORY_CARE_PROVIDER_SITE_OTHER): Payer: Medicaid Other | Admitting: Family Medicine

## 2018-06-16 ENCOUNTER — Telehealth: Payer: Self-pay | Admitting: Family Medicine

## 2018-06-16 ENCOUNTER — Encounter: Payer: Self-pay | Admitting: Family Medicine

## 2018-06-16 ENCOUNTER — Other Ambulatory Visit: Payer: Self-pay

## 2018-06-16 VITALS — BP 104/80 | HR 89 | Temp 98.6°F | Ht 65.0 in

## 2018-06-16 DIAGNOSIS — F17210 Nicotine dependence, cigarettes, uncomplicated: Secondary | ICD-10-CM

## 2018-06-16 DIAGNOSIS — J4540 Moderate persistent asthma, uncomplicated: Secondary | ICD-10-CM

## 2018-06-16 MED ORDER — TIOTROPIUM BROMIDE MONOHYDRATE 18 MCG IN CAPS: 18.0000 ug | ORAL_CAPSULE | Freq: Every day | RESPIRATORY_TRACT | 1 refills | Status: DC

## 2018-06-16 MED ORDER — BENZONATATE 200 MG PO CAPS: 200.0000 mg | ORAL_CAPSULE | Freq: Three times a day (TID) | ORAL | 0 refills | Status: DC | PRN

## 2018-06-16 MED ORDER — UMECLIDINIUM-VILANTEROL 62.5-25 MCG/INH IN AEPB: 1.0000 | INHALATION_SPRAY | Freq: Every day | RESPIRATORY_TRACT | 3 refills | Status: DC

## 2018-06-16 MED ORDER — PREDNISONE 20 MG PO TABS: 40.0000 mg | ORAL_TABLET | Freq: Every day | ORAL | 0 refills | Status: DC

## 2018-06-16 MED ORDER — NICOTINE POLACRILEX 4 MG MT GUM: 4.0000 mg | CHEWING_GUM | OROMUCOSAL | 3 refills | Status: DC | PRN

## 2018-06-16 NOTE — Telephone Encounter (Signed)
Patient notified

## 2018-06-16 NOTE — Telephone Encounter (Signed)
Patient states that the medication needs a PA, will have pharmacy send it to [place].

## 2018-06-16 NOTE — Progress Notes (Signed)
BP 104/80   Pulse 89   Temp 98.6 F (37 C) (Oral)   Ht 5\' 5"  (1.651 m)   BMI 19.97 kg/m    Subjective:    Patient ID: Karen Foster, female    DOB: 22-Nov-1960, 58 y.o.   MRN: [id number]  HPI: Karen Foster is a 58 y.o. female  Chief Complaint  Patient presents with  . Cough    benzonatate and prednisone refill    . This visit was completed via telephone due to the restrictions of the COVID-19 pandemic. All issues as above were discussed and addressed but no physical exam was performed. If it was felt that the patient should be evaluated in the office, they were directed there. The patient verbally consented to this visit. Patient was unable to complete an audio/visual visit due to Technical difficulties,Lack of internet. Due to the catastrophic nature of the COVID-19 pandemic, this visit was done through audio contact only. . Location of the patient: home . Location of the provider: work . Those involved with this call:  . Provider: Roosvelt Maser, PA-C . CMA: Elton Sin, CMA . Front Desk/Registration: Harriet Pho  . Time spent on call: 20 minutes on the phone discussing health concerns. 5 minutes total spent in review of patient's record and preparation of their chart.   Persistent cough the past month or two, thinks it's lingering due to the dry heat being on so often in the house she's living as she lives with elderly couple. Does note significant benefit with addition of singulair and symbicort at last visit. Still having occasional chest tightness and wheezes during coughing spells. Denies fevers, chills, CP, SOB. Hx of allergies, cigarette smoking,reactive airway, and pt reported COPD.   Nicorette gum helping quite a bit, down from 1/2 ppd to 2-3 cigarettes daily. No side effects, wanting to continue. Does not feel she can quit entirely but would like to keep at the current level of 2-3 daily. Feels her stress levels inhibit her from fully quitting.   Relevant past  medical, surgical, family and social history reviewed and updated as indicated. Interim medical history since our last visit reviewed. Allergies and medications reviewed and updated.  Review of Systems  Per HPI unless specifically indicated above     Objective:    BP 104/80   Pulse 89   Temp 98.6 F (37 C) (Oral)   Ht 5\' 5"  (1.651 m)   BMI 19.97 kg/m   Wt Readings from Last 3 Encounters:  06/08/18 120 lb (54.4 kg)  05/10/18 121 lb (54.9 kg)  02/22/18 121 lb (54.9 kg)    Physical Exam Unable to perform PE as she does not have capability to do video conference visit  Results for orders placed or performed in visit on 02/18/18  HgB A1c  Result Value Ref Range   Hgb A1c MFr Bld 5.3 4.8 - 5.6 %   Est. average glucose Bld gHb Est-mCnc 105 mg/dL  Basic Metabolic Panel (BMET)  Result Value Ref Range   Glucose 90 65 - 99 mg/dL   BUN 20 6 - 24 mg/dL   Creatinine, Ser 2.77 0.57 - 1.00 mg/dL   GFR calc non Af Amer 81 >59 mL/min/1.73   GFR calc Af Amer 94 >59 mL/min/1.73   BUN/Creatinine Ratio 25 (H) 9 - 23   Sodium 140 134 - 144 mmol/L   Potassium 4.0 3.5 - 5.2 mmol/L   Chloride 104 96 - 106 mmol/L   CO2 22 20 -  29 mmol/L   Calcium 9.0 8.7 - 10.2 mg/dL  UA/M w/rflx Culture, Routine  Result Value Ref Range   Specific Gravity, UA 1.025 1.005 - 1.030   pH, UA 5.5 5.0 - 7.5   Color, UA Yellow Yellow   Appearance Ur Clear Clear   Leukocytes, UA Negative Negative   Protein, UA Negative Negative/Trace   Glucose, UA Negative Negative   Ketones, UA Negative Negative   RBC, UA Negative Negative   Bilirubin, UA Negative Negative   Urobilinogen, Ur 0.2 0.2 - 1.0 mg/dL   Nitrite, UA Negative Negative      Assessment & Plan:   Problem List Items Addressed This Visit      Respiratory   Reactive airway disease - Primary    Improved with singulair and symbicort but still having breakthrough sxs. One more prednisone burst sent, start anoro daily, continue working on smoking  cessation. F/u if not improving        Other   Cigarette smoker    Successful with nicorette gum, down to 2-3 cigarettes daily. Congratulated pt, continue current regimen          Follow up plan: Return in about 4 weeks (around 07/14/2018) for Smoking, cough f/u.

## 2018-06-16 NOTE — Telephone Encounter (Signed)
Sent in spiriva to see if better covered  Copied from CRM [id number]. Topic: General - Other >> Jun 16, 2018 12:56 PM Mcneil, Ja-Kwan wrote: Reason for CRM: Pt stated her insurance will not cover the umeclidinium-vilanterol (ANORO ELLIPTA) 62.5-25 MCG/INH AEPB and it is too expensive for her to pay out of pocket. Pt asked if another medication could be prescribed. Pt requests call back. >> Jun 16, 2018  2:06 PM Richarda Blade wrote: Patient called back again and stated that Medicaid will pay for the inhaler she just needs a prior approval for it. Please send the prior authorization to the Pharmacy

## 2018-06-17 ENCOUNTER — Other Ambulatory Visit: Payer: Self-pay | Admitting: Family Medicine

## 2018-06-17 NOTE — Telephone Encounter (Signed)
Requested Prescriptions  Pending Prescriptions Disp Refills  . triamcinolone cream (KENALOG) 0.1 % [Pharmacy Med Name: Triamcinolone Acetonide 0.1 % External Cream] 60 g 0    Sig: APPLY 1 APPLICATION TOPICALLY TWICE DAILY     Dermatology:  Corticosteroids Passed - 06/17/2018  5:31 AM      Passed - Valid encounter within last 12 months    Recent Outpatient Visits          Yesterday    [facility] Particia [place], [place]   1 week ago Moderate persistent reactive airway disease without complication   [facility] [place], Salley Hews, [place]   1 month ago Panic disorder   [facility] [place], [place] T, NP   3 months ago Left wrist pain   [facility] Roosvelt Maser [place], [place]   3 months ago Severe episode of recurrent major depressive disorder, without psychotic features [facility])   [facility] Particia [place], [place]      Future Appointments            In 3 weeks Maurice March, Salley Hews, PA-C [facility], [facility]

## 2018-06-17 NOTE — Telephone Encounter (Signed)
Received e-prescribed error on 06/16/2018; spoke with Revonda Standard at [facility]; she states that the prior authorization was denied; will route to office for final disposition.

## 2018-06-20 ENCOUNTER — Telehealth: Payer: Self-pay | Admitting: Family Medicine

## 2018-06-20 NOTE — Telephone Encounter (Signed)
Patient notified

## 2018-06-20 NOTE — Telephone Encounter (Signed)
No refills for lost or stolen controlled substances

## 2018-06-20 NOTE — Telephone Encounter (Signed)
Copied from CRM [id number]. Topic: Quick Communication - Rx Refill/Question >> Jun 20, 2018  1:14 PM Baldo Daub L wrote: Medication:  chlorpheniramine-HYDROcodone (TUSSIONEX PENNKINETIC ER) 10-8 MG/5ML SUER   Pt states that she spilled the bottle and needs a refill  Has the patient contacted their pharmacy? Yes - needs new script for refill (Agent: If no, request that the patient contact the pharmacy for the refill.) (Agent: If yes, when and what did the pharmacy advise?)  Preferred Pharmacy (with phone number or street name): [facility] [address] ([address]), [place] - [address] [phone] (Phone) [phone] (Fax)  Agent: Please be advised that RX refills may take up to 3 business days. We ask that you follow-up with your pharmacy.

## 2018-06-20 NOTE — Telephone Encounter (Signed)
No, I've discussed at length with her she should not be taking these types of cough syrups for this long a period of time. She should switch to OTC cough syrups and honey tea as needed

## 2018-06-20 NOTE — Telephone Encounter (Signed)
Can you give her robitussin AC?

## 2018-06-20 NOTE — Assessment & Plan Note (Signed)
Improved with singulair and symbicort but still having breakthrough sxs. One more prednisone burst sent, start anoro daily, continue working on smoking cessation. F/u if not improving

## 2018-06-20 NOTE — Assessment & Plan Note (Signed)
Successful with nicorette gum, down to 2-3 cigarettes daily. Congratulated pt, continue current regimen

## 2018-06-27 DIAGNOSIS — G90512 Complex regional pain syndrome I of left upper limb: Secondary | ICD-10-CM | POA: Diagnosis not present

## 2018-06-27 DIAGNOSIS — M797 Fibromyalgia: Secondary | ICD-10-CM | POA: Diagnosis not present

## 2018-06-27 DIAGNOSIS — M1990 Unspecified osteoarthritis, unspecified site: Secondary | ICD-10-CM | POA: Diagnosis not present

## 2018-06-27 DIAGNOSIS — S72002S Fracture of unspecified part of neck of left femur, sequela: Secondary | ICD-10-CM | POA: Diagnosis not present

## 2018-06-27 DIAGNOSIS — G894 Chronic pain syndrome: Secondary | ICD-10-CM | POA: Diagnosis not present

## 2018-07-06 ENCOUNTER — Other Ambulatory Visit: Payer: Self-pay | Admitting: Family Medicine

## 2018-07-08 ENCOUNTER — Ambulatory Visit: Payer: Medicaid Other | Admitting: Family Medicine

## 2018-07-13 ENCOUNTER — Encounter: Payer: Self-pay | Admitting: Family Medicine

## 2018-07-13 ENCOUNTER — Other Ambulatory Visit: Payer: Self-pay

## 2018-07-13 ENCOUNTER — Ambulatory Visit (INDEPENDENT_AMBULATORY_CARE_PROVIDER_SITE_OTHER): Payer: Medicaid Other | Admitting: Family Medicine

## 2018-07-28 ENCOUNTER — Other Ambulatory Visit: Payer: Self-pay | Admitting: Family Medicine

## 2018-08-01 DIAGNOSIS — S72002S Fracture of unspecified part of neck of left femur, sequela: Secondary | ICD-10-CM | POA: Diagnosis not present

## 2018-08-01 DIAGNOSIS — G90512 Complex regional pain syndrome I of left upper limb: Secondary | ICD-10-CM | POA: Diagnosis not present

## 2018-08-01 DIAGNOSIS — M1990 Unspecified osteoarthritis, unspecified site: Secondary | ICD-10-CM | POA: Diagnosis not present

## 2018-08-01 DIAGNOSIS — G894 Chronic pain syndrome: Secondary | ICD-10-CM | POA: Diagnosis not present

## 2018-08-03 ENCOUNTER — Ambulatory Visit: Payer: Medicaid Other | Admitting: Family Medicine

## 2018-08-03 ENCOUNTER — Telehealth: Payer: Self-pay | Admitting: Family Medicine

## 2018-08-03 ENCOUNTER — Other Ambulatory Visit: Payer: Self-pay | Admitting: Family Medicine

## 2018-08-03 MED ORDER — TRIAMCINOLONE ACETONIDE 0.1 % EX CREA: TOPICAL_CREAM | CUTANEOUS | 0 refills | Status: DC

## 2018-08-03 NOTE — Telephone Encounter (Signed)
Called pt back to reschedule appt she is wondering if she can get a refill on her triamcinolene cream. Appt scheduled for next Thursday. Please advise.

## 2018-08-03 NOTE — Telephone Encounter (Signed)
Rx refilled.

## 2018-08-03 NOTE — Addendum Note (Signed)
Addended by: Roosvelt Maser E on: 08/03/2018 12:06 PM   Modules accepted: Orders

## 2018-08-11 ENCOUNTER — Ambulatory Visit: Payer: Medicaid Other | Admitting: Family Medicine

## 2018-08-13 ENCOUNTER — Other Ambulatory Visit: Payer: Self-pay | Admitting: Nurse Practitioner

## 2018-08-13 MED ORDER — NICOTINE POLACRILEX 2 MG MT GUM: 2.0000 mg | CHEWING_GUM | OROMUCOSAL | 3 refills | Status: DC | PRN

## 2018-08-13 NOTE — Progress Notes (Signed)
Pharmacist recommended change to 2 MG nicorette gum.

## 2018-08-29 DIAGNOSIS — S72002S Fracture of unspecified part of neck of left femur, sequela: Secondary | ICD-10-CM | POA: Diagnosis not present

## 2018-08-29 DIAGNOSIS — G90512 Complex regional pain syndrome I of left upper limb: Secondary | ICD-10-CM | POA: Diagnosis not present

## 2018-08-29 DIAGNOSIS — M1990 Unspecified osteoarthritis, unspecified site: Secondary | ICD-10-CM | POA: Diagnosis not present

## 2018-08-29 DIAGNOSIS — G894 Chronic pain syndrome: Secondary | ICD-10-CM | POA: Diagnosis not present

## 2018-09-04 ENCOUNTER — Other Ambulatory Visit: Payer: Self-pay | Admitting: Family Medicine

## 2018-09-04 NOTE — Telephone Encounter (Signed)
Requested Prescriptions  Pending Prescriptions Disp Refills  . benzonatate (TESSALON) 200 MG capsule [Pharmacy Med Name: Benzonatate 200 MG Oral Capsule] 30 capsule 0    Sig: Take 1 capsule by mouth three times daily as needed for cough     Ear, Nose, and Throat:  Antitussives/Expectorants Passed - 09/04/2018  3:48 PM      Passed - Valid encounter within last 12 months    Recent Outpatient Visits          1 month ago Erroneous encounter - disregard   [facility] [name], [place]   2 months ago Moderate persistent reactive airway disease without complication   [facility] Merrie Roof [place], [place]   2 months ago Moderate persistent reactive airway disease without complication   [facility] Merrie Roof [place], [place]   3 months ago Panic disorder   [place], [place] T, NP   6 months ago Left wrist pain   [facility] Merrie Roof [place], [place]

## 2018-09-08 ENCOUNTER — Other Ambulatory Visit: Payer: Self-pay | Admitting: Family Medicine

## 2018-09-08 MED ORDER — DICLOFENAC SODIUM 1 % TD GEL: 2.0000 g | Freq: Four times a day (QID) | TRANSDERMAL | 3 refills | Status: DC | PRN

## 2018-10-03 ENCOUNTER — Other Ambulatory Visit: Payer: Self-pay | Admitting: Family Medicine

## 2018-10-03 DIAGNOSIS — M1990 Unspecified osteoarthritis, unspecified site: Secondary | ICD-10-CM | POA: Diagnosis not present

## 2018-10-03 DIAGNOSIS — G90512 Complex regional pain syndrome I of left upper limb: Secondary | ICD-10-CM | POA: Diagnosis not present

## 2018-10-03 DIAGNOSIS — G894 Chronic pain syndrome: Secondary | ICD-10-CM | POA: Diagnosis not present

## 2018-10-03 DIAGNOSIS — S72002S Fracture of unspecified part of neck of left femur, sequela: Secondary | ICD-10-CM | POA: Diagnosis not present

## 2018-10-10 ENCOUNTER — Other Ambulatory Visit: Payer: Self-pay | Admitting: Family Medicine

## 2018-10-10 NOTE — Telephone Encounter (Signed)
Requested Prescriptions  Pending Prescriptions Disp Refills  . triamcinolone cream (KENALOG) 0.1 % [Pharmacy Med Name: Triamcinolone Acetonide 0.1 % External Cream] 60 g 0    Sig: APPLY TOPICALLY TWICE DAILY     Dermatology:  Corticosteroids Passed - 10/10/2018  5:32 AM      Passed - Valid encounter within last 12 months    Recent Outpatient Visits          2 months ago Erroneous encounter - disregard   [place], [place]   3 months ago Moderate persistent reactive airway disease without complication   [place], [place]   4 months ago Moderate persistent reactive airway disease without complication   [facility] Merrie Roof [place], [place]   5 months ago Panic disorder   [place], [place] T, NP   7 months ago Left wrist pain   [facility] Merrie Roof [place], [place]

## 2018-10-20 ENCOUNTER — Other Ambulatory Visit: Payer: Self-pay | Admitting: Family Medicine

## 2018-10-30 ENCOUNTER — Other Ambulatory Visit: Payer: Self-pay | Admitting: Family Medicine

## 2018-11-01 DIAGNOSIS — G90512 Complex regional pain syndrome I of left upper limb: Secondary | ICD-10-CM | POA: Diagnosis not present

## 2018-11-01 DIAGNOSIS — S72002S Fracture of unspecified part of neck of left femur, sequela: Secondary | ICD-10-CM | POA: Diagnosis not present

## 2018-11-01 DIAGNOSIS — G894 Chronic pain syndrome: Secondary | ICD-10-CM | POA: Diagnosis not present

## 2018-11-01 DIAGNOSIS — M1990 Unspecified osteoarthritis, unspecified site: Secondary | ICD-10-CM | POA: Diagnosis not present

## 2018-11-11 ENCOUNTER — Other Ambulatory Visit: Payer: Self-pay | Admitting: Family Medicine

## 2018-11-11 NOTE — Telephone Encounter (Signed)
benzonatate (TESSALON) 200 MG capsule   [facility]   Pt need a 30 day supply if possible

## 2018-11-11 NOTE — Telephone Encounter (Signed)
Requested medication (s) are due for refill today: no  Requested medication (s) are on the active medication list: yes  Last refill:  10/30/2018  Future visit scheduled: no  Notes to clinic:  Review for refill   Requested Prescriptions  Pending Prescriptions Disp Refills   benzonatate (TESSALON) 200 MG capsule 30 capsule 0    Sig: Take 1 capsule by mouth three times daily as needed for cough     Ear, Nose, and Throat:  Antitussives/Expectorants Passed - 11/11/2018  3:08 PM      Passed - Valid encounter within last 12 months    Recent Outpatient Visits          4 months ago Erroneous encounter - disregard   [facility] [name], [place]   4 months ago Moderate persistent reactive airway disease without complication   [place], [place], [place]   5 months ago Moderate persistent reactive airway disease without complication   [facility] Merrie Roof [place], [place]   6 months ago Panic disorder   [place], [place] T, NP   8 months ago Left wrist pain   [facility] Merrie Roof [place], [place]

## 2018-11-12 ENCOUNTER — Other Ambulatory Visit: Payer: Self-pay | Admitting: Family Medicine

## 2018-11-12 ENCOUNTER — Other Ambulatory Visit: Payer: Self-pay | Admitting: Nurse Practitioner

## 2018-11-12 DIAGNOSIS — J4540 Moderate persistent asthma, uncomplicated: Secondary | ICD-10-CM

## 2018-11-12 MED ORDER — BENZONATATE 200 MG PO CAPS: ORAL_CAPSULE | ORAL | 0 refills | Status: DC

## 2018-11-12 NOTE — Progress Notes (Signed)
Patient requests 30-day supply as this is less expensive overall.  One 30 day fill sent.  Follow-up with Merrie Roof is required for continuing fills at 30 days.

## 2018-11-12 NOTE — Telephone Encounter (Signed)
Requested medication (s) are due for refill today: yes  Requested medication (s) are on the active medication list: yes  Last refill:  10/30/2018  Future visit scheduled: review for refill  Notes to clinic: review for refill   Requested Prescriptions  Pending Prescriptions Disp Refills   benzonatate (TESSALON) 200 MG capsule [Pharmacy Med Name: Benzonatate 200 MG Oral Capsule] 30 capsule 0    Sig: Take 1 capsule by mouth three times daily as needed for cough     Ear, Nose, and Throat:  Antitussives/Expectorants Passed - 11/12/2018 10:55 AM      Passed - Valid encounter within last 12 months    Recent Outpatient Visits          4 months ago Erroneous encounter - disregard   [place], [place]   4 months ago Moderate persistent reactive airway disease without complication   [place], [place], [place]   5 months ago Moderate persistent reactive airway disease without complication   [facility] Merrie Roof [place], [place]   6 months ago Panic disorder   [place], [place] T, NP   8 months ago Left wrist pain   [facility] Merrie Roof [place], [place]

## 2018-11-14 NOTE — Telephone Encounter (Signed)
Med already refused.

## 2018-11-30 ENCOUNTER — Other Ambulatory Visit: Payer: Self-pay | Admitting: Family Medicine

## 2018-11-30 NOTE — Telephone Encounter (Signed)
Medication:  chlorpheniramine-HYDROcodone (TUSSIONEX PENNKINETIC ER) 10-8 MG/5ML SUER    Patient is requesting refill. She declined appointment.   Pharmacy:  [facility] [address]), [place] - [place] [phone] ([phone] (Fax

## 2018-11-30 NOTE — Telephone Encounter (Signed)
Patient would also like a refill on triamcinolone cream (KENALOG) 0.1 %

## 2018-11-30 NOTE — Telephone Encounter (Signed)
Requested medication (s) are due for refill today: no  Requested medication (s) are on the active medication list: yes  Last refill:  06/12/2018  Future visit scheduled: no  Notes to clinic:  Review for refill   Requested Prescriptions  Pending Prescriptions Disp Refills   chlorpheniramine-HYDROcodone (TUSSIONEX PENNKINETIC ER) 10-8 MG/5ML SUER 50 mL 0    Sig: Take 5 mLs by mouth every 12 (twelve) hours as needed for cough.     There is no refill protocol information for this order

## 2018-12-01 ENCOUNTER — Other Ambulatory Visit: Payer: Self-pay

## 2018-12-01 ENCOUNTER — Encounter: Payer: Self-pay | Admitting: Family Medicine

## 2018-12-01 ENCOUNTER — Ambulatory Visit: Payer: Medicaid Other | Admitting: Unknown Physician Specialty

## 2018-12-01 VITALS — BP 96/68 | HR 98 | Temp 98.2°F | Ht 65.0 in | Wt 123.0 lb

## 2018-12-01 DIAGNOSIS — F410 Panic disorder [episodic paroxysmal anxiety] without agoraphobia: Secondary | ICD-10-CM

## 2018-12-01 DIAGNOSIS — Z23 Encounter for immunization: Secondary | ICD-10-CM | POA: Diagnosis not present

## 2018-12-01 DIAGNOSIS — L509 Urticaria, unspecified: Secondary | ICD-10-CM | POA: Insufficient documentation

## 2018-12-01 DIAGNOSIS — J449 Chronic obstructive pulmonary disease, unspecified: Secondary | ICD-10-CM

## 2018-12-01 HISTORY — DX: Urticaria, unspecified: L50.9

## 2018-12-01 MED ORDER — TRIAMCINOLONE ACETONIDE 0.1 % EX CREA: TOPICAL_CREAM | CUTANEOUS | 0 refills | Status: DC

## 2018-12-01 MED ORDER — HYDROCOD POLST-CPM POLST ER 10-8 MG/5ML PO SUER: 5.0000 mL | Freq: Two times a day (BID) | ORAL | 0 refills | Status: DC | PRN

## 2018-12-01 MED ORDER — QUETIAPINE FUMARATE 100 MG PO TABS: 100.0000 mg | ORAL_TABLET | Freq: Every day | ORAL | 0 refills | Status: DC

## 2018-12-01 NOTE — Patient Instructions (Signed)
For Hives, take Clariton, Allegra, or Zyrtec

## 2018-12-01 NOTE — Assessment & Plan Note (Signed)
Increase Seroquel QHS from 50

## 2018-12-01 NOTE — Progress Notes (Signed)
BP 96/68   Pulse 98   Temp 98.2 F (36.8 C) (Oral)   Ht 5\' 5"  (1.651 m)   Wt 123 lb (55.8 kg)   SpO2 94%   BMI 20.47 kg/m    Subjective:    Patient ID: , female    DOB: 01/04/1961, 58 y.o.   MRN: [id number]  HPI: Karen Foster is a 58 y.o. female  Chief Complaint  Patient presents with  . Cough    dry cough  . Medication Refill    tussionex, triamcinolone cream   Pt has COPD plus "bad" sinus problem.  States with weather changes the cough gets worse.  States she needs Tussinex about once or twice a year.  This was last taken in April, "last time the weather changed."  She is taking Symbicort 2 puffs twice a day.  Takes the Spiriva daily.  Albuterol she uses about 0-2 times per day and takes it more often with her cough.    She has a lot of additional stressors with care giving responsibilities and having difficulty with breaking out in hives.  She would like a refill of Triamcinolone.cream.  She would like something besides Seroquel as she states she is always on edge. States she does have panic attacks and has done well with Tranzene in the past.  She states she is unable to see psychiatry or counseling as she can't be away.    Depression screen [facility] 2/9 12/01/2018 07/13/2018 06/08/2018 02/18/2018 12/03/2017  Decreased Interest 0 0 0 2 1  Down, Depressed, Hopeless 0 0 0 1 2  PHQ - 2 Score 0 0 0 3 3  Altered sleeping 1 0 0 2 3  Tired, decreased energy 0 0 0 2 2  Change in appetite 1 0 0 2 2  Feeling bad or failure about yourself  0 0 0 1 2  Trouble concentrating 0 0 0 2 2  Moving slowly or fidgety/restless 0 0 0 2 2  Suicidal thoughts 0 0 0 0 0  PHQ-9 Score 2 0 0 14 16     Relevant past medical, surgical, family and social history reviewed and updated as indicated. Interim medical history since our last visit reviewed. Allergies and medications reviewed and updated.  Review of Systems  Per HPI unless specifically indicated above     Objective:    BP 96/68    Pulse 98   Temp 98.2 F (36.8 C) (Oral)   Ht 5\' 5"  (1.651 m)   Wt 123 lb (55.8 kg)   SpO2 94%   BMI 20.47 kg/m   Wt Readings from Last 3 Encounters:  12/01/18 123 lb (55.8 kg)  07/13/18 114 lb (51.7 kg)  06/08/18 120 lb (54.4 kg)    Physical Exam Constitutional:      General: She is not in acute distress.    Appearance: Normal appearance. She is well-developed.  HENT:     Head: Normocephalic and atraumatic.  Eyes:     General: Lids are normal. No scleral icterus.       Right eye: No discharge.        Left eye: No discharge.     Conjunctiva/sclera: Conjunctivae normal.  Cardiovascular:     Rate and Rhythm: Normal rate.  Pulmonary:     Effort: Pulmonary effort is normal.  Abdominal:     Palpations: There is no hepatomegaly or splenomegaly.  Musculoskeletal: Normal range of motion.  Skin:    Coloration: Skin is not  pale.     Findings: No rash.  Neurological:     Mental Status: She is alert and oriented to person, place, and time.  Psychiatric:        Behavior: Behavior normal.        Thought Content: Thought content normal.        Judgment: Judgment normal.     Results for orders placed or performed in visit on 02/18/18  HgB A1c  Result Value Ref Range   Hgb A1c MFr Bld 5.3 4.8 - 5.6 %   Est. average glucose Bld gHb Est-mCnc 105 mg/dL  Basic Metabolic Panel (BMET)  Result Value Ref Range   Glucose 90 65 - 99 mg/dL   BUN 20 6 - 24 mg/dL   Creatinine, Ser 0.81 0.57 - 1.00 mg/dL   GFR calc non Af Amer 81 >59 mL/min/1.73   GFR calc Af Amer 94 >59 mL/min/1.73   BUN/Creatinine Ratio 25 (H) 9 - 23   Sodium 140 134 - 144 mmol/L   Potassium 4.0 3.5 - 5.2 mmol/L   Chloride 104 96 - 106 mmol/L   CO2 22 20 - 29 mmol/L   Calcium 9.0 8.7 - 10.2 mg/dL  UA/M w/rflx Culture, Routine   Specimen: Urine   URINE  Result Value Ref Range   Specific Gravity, UA 1.025 1.005 - 1.030   pH, UA 5.5 5.0 - 7.5   Color, UA Yellow Yellow   Appearance Ur Clear Clear   Leukocytes, UA  Negative Negative   Protein, UA Negative Negative/Trace   Glucose, UA Negative Negative   Ketones, UA Negative Negative   RBC, UA Negative Negative   Bilirubin, UA Negative Negative   Urobilinogen, Ur 0.2 0.2 - 1.0 mg/dL   Nitrite, UA Negative Negative      Assessment & Plan:   Problem List Items Addressed This Visit      Unprioritized   COPD mixed type ([place])    Pt reported taking all her inhalers appropriately.  Will refill Tussionex x1 but no more from this office      Relevant Medications   chlorpheniramine-HYDROcodone (TUSSIONEX PENNKINETIC ER) 10-8 MG/5ML SUER   Panic disorder    Increase Seroquel QHS from 50      Urticaria    Refill Triamcinolone.  Add an OTC antihistamine at night such as Clariton, Allegra, or Zyrtec.  Already taking Montelukast       Other Visit Diagnoses    Flu vaccine need    -  Primary   Relevant Orders   Flu Vaccine QUAD 36+ mos IM (Completed)       Follow up plan: Return in about 4 weeks (around 12/29/2018).

## 2018-12-01 NOTE — Assessment & Plan Note (Signed)
Refill Triamcinolone.  Add an OTC antihistamine at night such as Clariton, Allegra, or Zyrtec.  Already taking Montelukast

## 2018-12-01 NOTE — Assessment & Plan Note (Signed)
Pt reported taking all her inhalers appropriately.  Will refill Tussionex x1 but no more from this office

## 2018-12-05 DIAGNOSIS — S72002S Fracture of unspecified part of neck of left femur, sequela: Secondary | ICD-10-CM | POA: Diagnosis not present

## 2018-12-05 DIAGNOSIS — G90512 Complex regional pain syndrome I of left upper limb: Secondary | ICD-10-CM | POA: Diagnosis not present

## 2018-12-05 DIAGNOSIS — G894 Chronic pain syndrome: Secondary | ICD-10-CM | POA: Diagnosis not present

## 2018-12-05 DIAGNOSIS — M1990 Unspecified osteoarthritis, unspecified site: Secondary | ICD-10-CM | POA: Diagnosis not present

## 2018-12-14 ENCOUNTER — Telehealth: Payer: Self-pay | Admitting: Family Medicine

## 2018-12-14 NOTE — Telephone Encounter (Signed)
Medication Refill - Medication: benzonatate (TESSALON) 200 MG capsule Pt wants to know if she can get a 30 day supply due to it only being 3 dollars more / please advise   Has the patient contacted their pharmacy? Yes.   (Agent: If no, request that the patient contact the pharmacy for the refill.) (Agent: If yes, when and what did the pharmacy advise?)request sent to office   Preferred Pharmacy (with phone number or street name):  [facility] [facility] ([facility]), [place] - [place] [phone] (Phone) [phone] (Fax)     Agent: Please be advised that RX refills may take up to 3 business days. We ask that you follow-up with your pharmacy.

## 2018-12-15 NOTE — Telephone Encounter (Signed)
Patient notified

## 2018-12-15 NOTE — Telephone Encounter (Signed)
As I have told her numerous times that this is an acute illness medication and not something she should be on long term. Will not refill. If she is still coughing needs to be seen.

## 2019-01-03 DIAGNOSIS — S72002S Fracture of unspecified part of neck of left femur, sequela: Secondary | ICD-10-CM | POA: Diagnosis not present

## 2019-01-03 DIAGNOSIS — G90512 Complex regional pain syndrome I of left upper limb: Secondary | ICD-10-CM | POA: Diagnosis not present

## 2019-01-03 DIAGNOSIS — M1990 Unspecified osteoarthritis, unspecified site: Secondary | ICD-10-CM | POA: Diagnosis not present

## 2019-01-03 DIAGNOSIS — G894 Chronic pain syndrome: Secondary | ICD-10-CM | POA: Diagnosis not present

## 2019-01-03 DIAGNOSIS — M797 Fibromyalgia: Secondary | ICD-10-CM | POA: Diagnosis not present

## 2019-01-04 ENCOUNTER — Other Ambulatory Visit: Payer: Self-pay

## 2019-01-05 ENCOUNTER — Other Ambulatory Visit: Payer: Self-pay

## 2019-01-05 MED ORDER — NICOTINE POLACRILEX 2 MG MT GUM: 2.0000 mg | CHEWING_GUM | OROMUCOSAL | 3 refills | Status: DC | PRN

## 2019-01-05 MED ORDER — TRIAMCINOLONE ACETONIDE 0.1 % EX CREA: TOPICAL_CREAM | CUTANEOUS | 0 refills | Status: DC

## 2019-02-07 ENCOUNTER — Ambulatory Visit (INDEPENDENT_AMBULATORY_CARE_PROVIDER_SITE_OTHER): Payer: Medicaid Other | Admitting: Family Medicine

## 2019-02-07 ENCOUNTER — Other Ambulatory Visit: Payer: Self-pay

## 2019-02-07 ENCOUNTER — Telehealth: Payer: Self-pay

## 2019-02-07 ENCOUNTER — Ambulatory Visit
Admission: RE | Admit: 2019-02-07 | Discharge: 2019-02-07 | Disposition: A | Discharge status: Home / Self Care | Payer: Medicaid Other | Source: Ambulatory Visit | Attending: Family Medicine | Admitting: Family Medicine

## 2019-02-07 ENCOUNTER — Other Ambulatory Visit: Payer: Self-pay | Admitting: Family Medicine

## 2019-02-07 ENCOUNTER — Ambulatory Visit
Admission: RE | Admit: 2019-02-07 | Discharge: 2019-02-07 | Disposition: A | Discharge status: Home / Self Care | Payer: Medicaid Other | Attending: Family Medicine | Admitting: Family Medicine

## 2019-02-07 ENCOUNTER — Telehealth: Payer: Self-pay | Admitting: Family Medicine

## 2019-02-07 ENCOUNTER — Encounter: Payer: Self-pay | Admitting: Family Medicine

## 2019-02-07 VITALS — Temp 98.6°F | Ht 65.0 in

## 2019-02-07 DIAGNOSIS — J449 Chronic obstructive pulmonary disease, unspecified: Secondary | ICD-10-CM

## 2019-02-07 DIAGNOSIS — R05 Cough: Secondary | ICD-10-CM

## 2019-02-07 DIAGNOSIS — F17210 Nicotine dependence, cigarettes, uncomplicated: Secondary | ICD-10-CM | POA: Diagnosis not present

## 2019-02-07 DIAGNOSIS — J4540 Moderate persistent asthma, uncomplicated: Secondary | ICD-10-CM | POA: Diagnosis not present

## 2019-02-07 DIAGNOSIS — R053 Chronic cough: Secondary | ICD-10-CM

## 2019-02-07 MED ORDER — BENZONATATE 200 MG PO CAPS: ORAL_CAPSULE | ORAL | 0 refills | Status: DC

## 2019-02-07 NOTE — Telephone Encounter (Signed)
Copied from [place] [id number]. Topic: Quick Communication - Other Results (Clinic Use ONLY) >> Feb 07, 2019  2:46 PM Lennox Solders wrote: Pt would like chest xray result. Pt had xray today   Routing to provider for result.

## 2019-02-07 NOTE — Progress Notes (Signed)
Temp 98.6 F (37 C) (Temporal)   Ht 5\' 5"  (1.651 m)   BMI 20.47 kg/m    Subjective:    Patient ID: Karen Foster, female    DOB: 25-Sep-1960, 58 y.o.   MRN: [id number]  HPI: Karen Foster is a 58 y.o. female  Chief Complaint  Patient presents with  . Cough    since last Wednesday. has tried OTC robitussin  . Sore Throat    . This visit was completed via telephone due to the restrictions of the COVID-19 pandemic. All issues as above were discussed and addressed. Physical exam was done as above through visual confirmation on telephone. If it was felt that the patient should be evaluated in the office, they were directed there. The patient verbally consented to this visit. . Location of the patient: home . Location of the provider: home . Those involved with this call:  . Provider: Merrie Roof, PA-C . CMA: Lesle Chris, [place] . Front Desk/Registration: Jill Side  . Time spent on call: 15 minutes on the phone discussing health concerns. 5 minutes total spent in review of patient's record and preparation of their chart. I verified patient identity using two factors (patient name and date of birth). Patient consents verbally to being seen via telemedicine visit today.   Patient presenting today for worsening chronic cough the past week or so that is sometimes causing her to choke. Has had issues with this cough for several years now and uses tessalon perles quite frequently for some relief. 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 Used to be on Cheratussin on regular basis for post-nasal drainage, cough sxs. Known COPD, 40+ pack year smoker but quit 3 days ago which she is really excited about. States she's compliant with her spiriva , symbicort and albuterol as well as her singulair and flonase regimen. Denies fever, chills, sick contacts, loss of taste or  smell, N/V/D. States she has not been out in public in a while so does not have COVID and does not want testing for this.   Quit smoking about 3 weeks ago using nicorette gum.   Relevant past medical, surgical, family and social history reviewed and updated as indicated. Interim medical history since our last visit reviewed. Allergies and medications reviewed and updated.  Review of Systems  Per HPI unless specifically indicated above     Objective:    Temp 98.6 F (37 C) (Temporal)   Ht 5\' 5"  (1.651 m)   BMI 20.47 kg/m   Wt Readings from Last 3 Encounters:  12/01/18 123 lb (55.8 kg)  07/13/18 114 lb (51.7 kg)  06/08/18 120 lb (54.4 kg)    Physical Exam  Unable to perform PE due to patient lack of access to video technology for today's visit.   Results for orders placed or performed in visit on 02/18/18  HgB A1c  Result Value Ref Range   Hgb A1c MFr Bld 5.3 4.8 - 5.6 %   Est. average glucose Bld gHb Est-mCnc 105 mg/dL  Basic Metabolic Panel (BMET)  Result Value Ref Range   Glucose 90 65 - 99 mg/dL   BUN 20 6 - 24 mg/dL   Creatinine, Ser 0.81 0.57 - 1.00 mg/dL   GFR calc non Af Amer 81 >59 mL/min/1.73   GFR calc Af Amer 94 >59 mL/min/1.73  BUN/Creatinine Ratio 25 (H) 9 - 23   Sodium 140 134 - 144 mmol/L   Potassium 4.0 3.5 - 5.2 mmol/L   Chloride 104 96 - 106 mmol/L   CO2 22 20 - 29 mmol/L   Calcium 9.0 8.7 - 10.2 mg/dL  UA/M w/rflx Culture, Routine   Specimen: Urine   URINE  Result Value Ref Range   Specific Gravity, UA 1.025 1.005 - 1.030   pH, UA 5.5 5.0 - 7.5   Color, UA Yellow Yellow   Appearance Ur Clear Clear   Leukocytes, UA Negative Negative   Protein, UA Negative Negative/Trace   Glucose, UA Negative Negative   Ketones, UA Negative Negative   RBC, UA Negative Negative   Bilirubin, UA Negative Negative   Urobilinogen, Ur 0.2 0.2 - 1.0 mg/dL   Nitrite, UA Negative Negative      Assessment & Plan:   Problem List Items Addressed This Visit       Respiratory   Reactive airway disease   Relevant Medications   benzonatate (TESSALON) 200 MG capsule   COPD mixed type (HCC)    Discussed with patient my concern over her persistent poor control despite reported excellent compliance to comprehensive COPD and allergy/asthma regimen. Consistently calling to request tessalon perles refills or cough syrups and using the medications nearly daily. Has not had imaging in several years, will start with x-ray and progress from there. She is also agreeable to a Pulmonology referral for an opinion on the matter      Relevant Medications   benzonatate (TESSALON) 200 MG capsule   Other Relevant Orders   DG Chest 2 View (Completed)   Ambulatory referral to Pulmonology     Other   Cigarette smoker    Quit smoking 3 weeks ago! Congratulated success       Other Visit Diagnoses    Chronic cough    -  Primary   Relevant Orders   DG Chest 2 View (Completed)   Ambulatory referral to Pulmonology       Follow up plan: Return if symptoms worsen or fail to improve.

## 2019-02-07 NOTE — Telephone Encounter (Signed)
Pt went to [facility] today and completed the chest xray and they advised that Karen Foster should have results within the hour/ please advise

## 2019-02-08 ENCOUNTER — Other Ambulatory Visit: Payer: Self-pay | Admitting: Family Medicine

## 2019-02-08 DIAGNOSIS — G90512 Complex regional pain syndrome I of left upper limb: Secondary | ICD-10-CM | POA: Diagnosis not present

## 2019-02-08 DIAGNOSIS — M1990 Unspecified osteoarthritis, unspecified site: Secondary | ICD-10-CM | POA: Diagnosis not present

## 2019-02-08 DIAGNOSIS — S72002S Fracture of unspecified part of neck of left femur, sequela: Secondary | ICD-10-CM | POA: Diagnosis not present

## 2019-02-08 DIAGNOSIS — G894 Chronic pain syndrome: Secondary | ICD-10-CM | POA: Diagnosis not present

## 2019-02-08 MED ORDER — AZITHROMYCIN 250 MG PO TABS: ORAL_TABLET | ORAL | 0 refills | Status: DC

## 2019-02-08 NOTE — Telephone Encounter (Signed)
Result sent through mychart, ok to read message to her

## 2019-02-08 NOTE — Telephone Encounter (Signed)
Addressed in another message.

## 2019-02-08 NOTE — Telephone Encounter (Signed)
Patient notified of Rachel's message.  

## 2019-02-09 ENCOUNTER — Other Ambulatory Visit: Payer: Self-pay | Admitting: Family Medicine

## 2019-02-09 ENCOUNTER — Telehealth: Payer: Self-pay | Admitting: Family Medicine

## 2019-02-09 MED ORDER — SPIRIVA HANDIHALER 18 MCG IN CAPS: 18.0000 ug | ORAL_CAPSULE | Freq: Every day | RESPIRATORY_TRACT | 1 refills | Status: DC

## 2019-02-09 MED ORDER — BUDESONIDE-FORMOTEROL FUMARATE 160-4.5 MCG/ACT IN AERO: 2.0000 | INHALATION_SPRAY | Freq: Two times a day (BID) | RESPIRATORY_TRACT | 3 refills | Status: DC

## 2019-02-09 MED ORDER — TRIAMCINOLONE ACETONIDE 0.1 % EX CREA: TOPICAL_CREAM | CUTANEOUS | 0 refills | Status: DC

## 2019-02-09 NOTE — Telephone Encounter (Signed)
Excellent, thank you  Copied from [place] [id number]. Topic: General - Inquiry >> Feb 09, 2019  1:05 PM Scherrie Gerlach wrote: Reason for CRM: pt wants Apolonio Schneiders to know the first available that pulmonary had was 03/15/2019. Pt did not want Apolonio Schneiders to think she has put this appt off.

## 2019-02-09 NOTE — Telephone Encounter (Signed)
Pt request refill  budesonide-formoterol (SYMBICORT) 160-4.5 MCG/ACT inhaler  tiotropium (SPIRIVA HANDIHALER) 18 MCG inhalation capsule  triamcinolone cream (KENALOG) 0.1   [facility] ([facility]),  [address] Phone:  [phone]  Fax:  [phone]

## 2019-02-10 MED ORDER — TRIAMCINOLONE ACETONIDE 0.1 % EX CREA: TOPICAL_CREAM | CUTANEOUS | 0 refills | Status: DC

## 2019-02-10 NOTE — Assessment & Plan Note (Signed)
Quit smoking 3 weeks ago! Congratulated success

## 2019-02-10 NOTE — Assessment & Plan Note (Signed)
Discussed with patient my concern over her persistent poor control despite reported excellent compliance to comprehensive COPD and allergy/asthma regimen. Consistently calling to request tessalon perles refills or cough syrups and using the medications nearly daily. Has not had imaging in several years, will start with x-ray and progress from there. She is also agreeable to a Pulmonology referral for an opinion on the matter

## 2019-03-13 DIAGNOSIS — M1990 Unspecified osteoarthritis, unspecified site: Secondary | ICD-10-CM | POA: Diagnosis not present

## 2019-03-13 DIAGNOSIS — S72002S Fracture of unspecified part of neck of left femur, sequela: Secondary | ICD-10-CM | POA: Diagnosis not present

## 2019-03-13 DIAGNOSIS — G90512 Complex regional pain syndrome I of left upper limb: Secondary | ICD-10-CM | POA: Diagnosis not present

## 2019-03-13 DIAGNOSIS — G894 Chronic pain syndrome: Secondary | ICD-10-CM | POA: Diagnosis not present

## 2019-03-15 ENCOUNTER — Institutional Professional Consult (permissible substitution): Payer: Medicaid Other | Admitting: Pulmonary Disease

## 2019-03-20 ENCOUNTER — Other Ambulatory Visit: Payer: Self-pay | Admitting: Family Medicine

## 2019-03-20 DIAGNOSIS — J4540 Moderate persistent asthma, uncomplicated: Secondary | ICD-10-CM

## 2019-04-04 DIAGNOSIS — M1990 Unspecified osteoarthritis, unspecified site: Secondary | ICD-10-CM | POA: Diagnosis not present

## 2019-04-04 DIAGNOSIS — Z79891 Long term (current) use of opiate analgesic: Secondary | ICD-10-CM | POA: Diagnosis not present

## 2019-04-04 DIAGNOSIS — M797 Fibromyalgia: Secondary | ICD-10-CM | POA: Diagnosis not present

## 2019-04-04 DIAGNOSIS — G894 Chronic pain syndrome: Secondary | ICD-10-CM | POA: Diagnosis not present

## 2019-04-06 ENCOUNTER — Other Ambulatory Visit: Payer: Self-pay | Admitting: Family Medicine

## 2019-04-07 MED ORDER — TRIAMCINOLONE ACETONIDE 0.1 % EX CREA: TOPICAL_CREAM | CUTANEOUS | 0 refills | Status: DC

## 2019-04-18 ENCOUNTER — Institutional Professional Consult (permissible substitution): Payer: Medicaid Other | Admitting: Pulmonary Disease

## 2019-04-25 DIAGNOSIS — M1990 Unspecified osteoarthritis, unspecified site: Secondary | ICD-10-CM | POA: Diagnosis not present

## 2019-04-25 DIAGNOSIS — G894 Chronic pain syndrome: Secondary | ICD-10-CM | POA: Diagnosis not present

## 2019-04-25 DIAGNOSIS — M797 Fibromyalgia: Secondary | ICD-10-CM | POA: Diagnosis not present

## 2019-04-25 DIAGNOSIS — G90512 Complex regional pain syndrome I of left upper limb: Secondary | ICD-10-CM | POA: Diagnosis not present

## 2019-04-26 ENCOUNTER — Other Ambulatory Visit: Payer: Self-pay | Admitting: Family Medicine

## 2019-04-26 DIAGNOSIS — J4540 Moderate persistent asthma, uncomplicated: Secondary | ICD-10-CM

## 2019-04-26 MED ORDER — BENZONATATE 200 MG PO CAPS: 200.0000 mg | ORAL_CAPSULE | Freq: Two times a day (BID) | ORAL | 0 refills | Status: DC | PRN

## 2019-04-26 MED ORDER — TRIAMCINOLONE ACETONIDE 0.1 % EX CREA: TOPICAL_CREAM | CUTANEOUS | 0 refills | Status: DC

## 2019-04-26 NOTE — Telephone Encounter (Signed)
Requested medication (s) are due for refill today:yes  Requested medication (s) are on the active medication list: yes  Last refill:  03/20/19  Future visit scheduled: No  Notes to clinic:  Note no Sig. Notes from visits state patient asking for refills to often.    Requested Prescriptions  Pending Prescriptions Disp Refills   benzonatate (TESSALON) 200 MG capsule 90 capsule 0      Ear, Nose, and Throat:  Antitussives/Expectorants Passed - 04/26/2019 11:55 AM      Passed - Valid encounter within last 12 months    Recent Outpatient Visits           2 months ago Chronic cough   [facility] Roosvelt Maser [place], [place]   4 months ago Flu vaccine need   [facility] Gabriel Cirri, NP   9 months ago Erroneous encounter - disregard   [facility] Particia Nearing, [place]   10 months ago Moderate persistent reactive airway disease without complication   [facility] Roosvelt Maser [place], [place]   10 months ago Moderate persistent reactive airway disease without complication   [facility] Roosvelt Maser [place], [place]               Signed Prescriptions Disp Refills   triamcinolone cream (KENALOG) 0.1 % 60 g 0    Sig: APPLY TOPICALLY TWICE DAILY      Dermatology:  Corticosteroids Passed - 04/26/2019 11:55 AM      Passed - Valid encounter within last 12 months    Recent Outpatient Visits           2 months ago Chronic cough   [facility] Roosvelt Maser [place], [place]   4 months ago Flu vaccine need   [facility] Gabriel Cirri, NP   9 months ago Erroneous encounter - disregard   [facility] Particia Nearing, [place]   10 months ago Moderate persistent reactive airway disease without complication   [facility] Roosvelt Maser [place], [place]   10 months ago Moderate persistent reactive airway disease without complication   [facility] Roosvelt Maser [place], [place]

## 2019-04-26 NOTE — Telephone Encounter (Signed)
Copied from CRM [id number]. Topic: Quick Communication - Rx Refill/Question >> Apr 26, 2019 11:42 AM Dalphine Handing A wrote: Medication: benzonatate (TESSALON) 200 MG capsule,triamcinolone cream (KENALOG) 0.1 %  Has the patient contacted their pharmacy? yes (Agent: If no, request that the patient contact the pharmacy for the refill.) (Agent: If yes, when and what did the pharmacy advise?)contact pcp  Preferred Pharmacy (with phone number or street name): [facility] [address] [place]), [place] - [address]  Phone:  [phone] Fax:  [phone]     Agent: Please be advised that RX refills may take up to 3 business days. We ask that you follow-up with your pharmacy.

## 2019-05-04 ENCOUNTER — Ambulatory Visit: Payer: Medicaid Other

## 2019-05-21 ENCOUNTER — Other Ambulatory Visit: Payer: Self-pay | Admitting: Family Medicine

## 2019-05-21 DIAGNOSIS — J4540 Moderate persistent asthma, uncomplicated: Secondary | ICD-10-CM

## 2019-05-21 NOTE — Telephone Encounter (Signed)
Approved per protocol. Pulmonary appointment in 2 days.

## 2019-05-23 ENCOUNTER — Institutional Professional Consult (permissible substitution): Payer: Medicaid Other | Admitting: Pulmonary Disease

## 2019-05-23 DIAGNOSIS — M797 Fibromyalgia: Secondary | ICD-10-CM | POA: Diagnosis not present

## 2019-05-23 DIAGNOSIS — G5603 Carpal tunnel syndrome, bilateral upper limbs: Secondary | ICD-10-CM | POA: Diagnosis not present

## 2019-05-23 DIAGNOSIS — M1990 Unspecified osteoarthritis, unspecified site: Secondary | ICD-10-CM | POA: Diagnosis not present

## 2019-05-23 DIAGNOSIS — G894 Chronic pain syndrome: Secondary | ICD-10-CM | POA: Diagnosis not present

## 2019-05-25 ENCOUNTER — Encounter: Payer: Self-pay | Admitting: Family Medicine

## 2019-05-25 ENCOUNTER — Ambulatory Visit (INDEPENDENT_AMBULATORY_CARE_PROVIDER_SITE_OTHER): Payer: Medicaid Other | Admitting: Family Medicine

## 2019-05-25 ENCOUNTER — Telehealth: Payer: Self-pay

## 2019-05-25 VITALS — BP 123/64 | HR 115 | Temp 97.3°F | Ht 65.0 in

## 2019-05-25 DIAGNOSIS — J3089 Other allergic rhinitis: Secondary | ICD-10-CM | POA: Diagnosis not present

## 2019-05-25 DIAGNOSIS — J449 Chronic obstructive pulmonary disease, unspecified: Secondary | ICD-10-CM | POA: Diagnosis not present

## 2019-05-25 DIAGNOSIS — F17210 Nicotine dependence, cigarettes, uncomplicated: Secondary | ICD-10-CM

## 2019-05-25 MED ORDER — NICOTINE POLACRILEX 2 MG MT GUM: 2.0000 mg | CHEWING_GUM | OROMUCOSAL | 3 refills | Status: DC | PRN

## 2019-05-25 MED ORDER — PREDNISONE 10 MG PO TABS: ORAL_TABLET | ORAL | 0 refills | Status: DC

## 2019-05-25 NOTE — Telephone Encounter (Signed)
Return call to patient and spoke to her. Patient states that she has been having some issues with incontinence, especially during the night. Patient would like a Rx for some pads to be sent to [facility] so that medicaid will pay for them.   Copied from CRM [id number]. Topic: General - Other >> May 25, 2019 10:46 AM Dalphine Handing A wrote: Patient just had virtual visit with Maurice March and forgot to ask a question and would like a callback from nurse. Please advise

## 2019-05-25 NOTE — Progress Notes (Signed)
BP 123/64   Pulse (!) 115   Temp (!) 97.3 F (36.3 C) (Oral)   Ht 5\' 5"  (1.651 m)   SpO2 97%   BMI 20.47 kg/m    Subjective:    Patient ID: , female    DOB: May 18, 1960, 59 y.o.   MRN: [id number]  HPI: Karen Foster is a 59 y.o. female  Chief Complaint  Patient presents with  . Cough  . Nasal Congestion    x a week    . This visit was completed via telephone due to the restrictions of the COVID-19 pandemic. All issues as above were discussed and addressed. Physical exam was done as above through visual confirmation on telephone. If it was felt that the patient should be evaluated in the office, they were directed there. The patient verbally consented to this visit. . Location of the patient: home . Location of the provider: work . Those involved with this call:  . Provider: 59, PA-C . CMA: Ro[place]velt Maser, CMA . Front Desk/Registration: Elton Sin  . Time spent on call: 15 minutes on the phone discussing health concerns. 5 minutes total spent in review of patient's record and preparation of their chart. I verified patient identity using two factors (patient name and date of birth). Patient consents verbally to being seen via telemedicine visit today.   About a week now of congestion, productive cough, drainage down throat causing nausea. Denies fever, chills, sweats, body aches, vomiting, diarrhea. Using tessalon, inhalers, and allergy regimen without complete relief. Also getting robitussin AC from behind counter as able. States she gets this several times per year and the only thing that helps her cough is tussionex or something equivalent.   Was able to quit smoking using nicorette gum, still having to use the 2 mg gum as needed for cravings.   Relevant past medical, surgical, family and social history reviewed and updated as indicated. Interim medical history since our last visit reviewed. Allergies and medications reviewed and updated.  Review of  Systems  Per HPI unless specifically indicated above     Objective:    BP 123/64   Pulse (!) 115   Temp (!) 97.3 F (36.3 C) (Oral)   Ht 5\' 5"  (1.651 m)   SpO2 97%   BMI 20.47 kg/m   Wt Readings from Last 3 Encounters:  12/01/18 123 lb (55.8 kg)  07/13/18 114 lb (51.7 kg)  06/08/18 120 lb (54.4 kg)    Physical Exam  Unable to perform PE due to patient lack of access to video technology for visit  Results for orders placed or performed in visit on 02/18/18  HgB A1c  Result Value Ref Range   Hgb A1c MFr Bld 5.3 4.8 - 5.6 %   Est. average glucose Bld gHb Est-mCnc 105 mg/dL  Basic Metabolic Panel (BMET)  Result Value Ref Range   Glucose 90 65 - 99 mg/dL   BUN 20 6 - 24 mg/dL   Creatinine, Ser 08/08/18 0.57 - 1.00 mg/dL   GFR calc non Af Amer 81 >59 mL/min/1.73   GFR calc Af Amer 94 >59 mL/min/1.73   BUN/Creatinine Ratio 25 (H) 9 - 23   Sodium 140 134 - 144 mmol/L   Potassium 4.0 3.5 - 5.2 mmol/L   Chloride 104 96 - 106 mmol/L   CO2 22 20 - 29 mmol/L   Calcium 9.0 8.7 - 10.2 mg/dL  UA/M w/rflx Culture, Routine   Specimen: Urine   URINE  Result Value Ref Range   Specific Gravity, UA 1.025 1.005 - 1.030   pH, UA 5.5 5.0 - 7.5   Color, UA Yellow Yellow   Appearance Ur Clear Clear   Leukocytes, UA Negative Negative   Protein, UA Negative Negative/Trace   Glucose, UA Negative Negative   Ketones, UA Negative Negative   RBC, UA Negative Negative   Bilirubin, UA Negative Negative   Urobilinogen, Ur 0.2 0.2 - 1.0 mg/dL   Nitrite, UA Negative Negative      Assessment & Plan:   Problem List Items Addressed This Visit      Respiratory   COPD mixed type (HCC)    Exacerbated, will start prednisone taper, mucinex, continue inhaler and allergy regimen. Pt continues to ask for controlled cough syrup to help her get some sleep through cough, discussed at length that I am not comfortable writing this at this time as she tends to use this frequently to mask her chronic cough and  additionally per review of controlled substance database she is now on norco up to 4 times daily through pain management which was not on current medication list that was just reconciled. Can continue tessalon perles, delsym.       Relevant Medications   nicotine polacrilex (NICORETTE) 2 MG gum   predniSONE (DELTASONE) 10 MG tablet   Allergic rhinitis    Continue good allergy regimen and inhalers. Prednisone sent, supportive care and return precautions reviewed        Other   Cigarette smoker - Primary    Currently in remission using nicorette gum, continue gum prn until cravings resolve        25 minutes spent today in direct care and counseling with patient  Follow up plan: Return if symptoms worsen or fail to improve.

## 2019-05-25 NOTE — Telephone Encounter (Signed)
This is a completely different issue, she will need an appointment to discuss this

## 2019-05-26 DIAGNOSIS — J309 Allergic rhinitis, unspecified: Secondary | ICD-10-CM | POA: Insufficient documentation

## 2019-05-26 NOTE — Assessment & Plan Note (Signed)
Exacerbated, will start prednisone taper, mucinex, continue inhaler and allergy regimen. Pt continues to ask for controlled cough syrup to help her get some sleep through cough, discussed at length that I am not comfortable writing this at this time as she tends to use this frequently to mask her chronic cough and additionally per review of controlled substance database she is now on norco up to 4 times daily through pain management which was not on current medication list that was just reconciled. Can continue tessalon perles, delsym.

## 2019-05-26 NOTE — Telephone Encounter (Signed)
Called and LVM for patient to return call to schedule a follow up. 

## 2019-05-26 NOTE — Assessment & Plan Note (Signed)
Continue good allergy regimen and inhalers. Prednisone sent, supportive care and return precautions reviewed

## 2019-05-26 NOTE — Assessment & Plan Note (Signed)
Currently in remission using nicorette gum, continue gum prn until cravings resolve

## 2019-05-29 NOTE — Telephone Encounter (Signed)
Called patient and LVM asking pt to return call to schedule a follow up.

## 2019-06-01 NOTE — Telephone Encounter (Signed)
Closing this encounter due to pt not returning call.

## 2019-06-20 DIAGNOSIS — G894 Chronic pain syndrome: Secondary | ICD-10-CM | POA: Diagnosis not present

## 2019-07-11 ENCOUNTER — Other Ambulatory Visit: Payer: Self-pay | Admitting: Family Medicine

## 2019-07-11 DIAGNOSIS — J4540 Moderate persistent asthma, uncomplicated: Secondary | ICD-10-CM

## 2019-07-18 DIAGNOSIS — G894 Chronic pain syndrome: Secondary | ICD-10-CM | POA: Diagnosis not present

## 2019-07-18 DIAGNOSIS — Z79891 Long term (current) use of opiate analgesic: Secondary | ICD-10-CM | POA: Diagnosis not present

## 2019-08-15 DIAGNOSIS — Z79891 Long term (current) use of opiate analgesic: Secondary | ICD-10-CM | POA: Diagnosis not present

## 2019-08-15 DIAGNOSIS — M1990 Unspecified osteoarthritis, unspecified site: Secondary | ICD-10-CM | POA: Diagnosis not present

## 2019-08-15 DIAGNOSIS — G894 Chronic pain syndrome: Secondary | ICD-10-CM | POA: Diagnosis not present

## 2019-09-11 ENCOUNTER — Other Ambulatory Visit: Payer: Self-pay | Admitting: Family Medicine

## 2019-09-12 DIAGNOSIS — Z79891 Long term (current) use of opiate analgesic: Secondary | ICD-10-CM | POA: Diagnosis not present

## 2019-09-12 DIAGNOSIS — G894 Chronic pain syndrome: Secondary | ICD-10-CM | POA: Diagnosis not present

## 2019-09-12 DIAGNOSIS — M1990 Unspecified osteoarthritis, unspecified site: Secondary | ICD-10-CM | POA: Diagnosis not present

## 2020-01-09 ENCOUNTER — Other Ambulatory Visit: Payer: Self-pay | Admitting: Family Medicine

## 2020-01-09 DIAGNOSIS — J4540 Moderate persistent asthma, uncomplicated: Secondary | ICD-10-CM

## 2020-01-09 NOTE — Telephone Encounter (Signed)
Requested medications are due for refill today? Yes  Requested medications are on active medication list?  Yes  Last Refill:   07/11/2019  # 60 with no refills   Future visit scheduled? No   Notes to Clinic:    Patient was last seen by Particia Nearing, PA-C in March 2021 (7 months ago).  No upcoming appointments scheduled.

## 2020-02-29 ENCOUNTER — Other Ambulatory Visit: Payer: Self-pay | Admitting: Nurse Practitioner

## 2020-02-29 ENCOUNTER — Telehealth: Payer: Self-pay | Admitting: Family Medicine

## 2020-02-29 ENCOUNTER — Telehealth: Payer: Self-pay

## 2020-02-29 MED ORDER — PERMETHRIN 5 % EX CREA: 1.0000 "application " | TOPICAL_CREAM | Freq: Once | CUTANEOUS | 0 refills | Status: AC

## 2020-02-29 NOTE — Telephone Encounter (Signed)
Patient would need visit and may attend closest urgent care to her since she is out of town for assessment.

## 2020-02-29 NOTE — Telephone Encounter (Signed)
Routing to provider. Pharmacy updated in care RX can be sent in

## 2020-02-29 NOTE — Telephone Encounter (Signed)
I have sent in one time script for this to location requested, but if ongoing symptoms or concerns she will need visit.  Thank you.

## 2020-02-29 NOTE — Telephone Encounter (Signed)
Copied from CRM [id number]. Topic: General - Other >> Feb 29, 2020 11:32 AM Marylen Ponto wrote: Reason for CRM: Pt stated she is currently out of town for the holidays visiting family and she was informed that some family members tested positive for scabies. Pt stated she was advised to contact her pcp for an ointment. Pt requests that the Rx for the ointment be sent to [facility] [address] [place], [place] - [address]   Phone: [phone]   Fax: [phone] >> Feb 29, 2020  4:40 PM Uvaldo Rising, Jannifer Rodney wrote: Informed pt that she would need a visit or can be seen at an urgent care since she is out of town. Pt stated it is not a control substance and family members have scabies and the urgent care does not taker her insurance. Pt requests call back asap

## 2020-02-29 NOTE — Telephone Encounter (Signed)
Copied from CRM [id number]. Topic: General - Other >> Feb 29, 2020 11:32 AM Marylen Ponto wrote: Reason for CRM: Pt stated she is currently out of town for the holidays visiting family and she was informed that some family members tested positive for scabies. Pt stated she was advised to contact her pcp for an ointment. Pt requests that the Rx for the ointment be sent to [facility] [address] [place], [place] - [address]   Phone: [phone]   Fax: [phone]   Would she need apt or can something be called in for her?

## 2020-02-29 NOTE — Telephone Encounter (Signed)
Routing to provider. See patient response please.

## 2020-03-04 NOTE — Telephone Encounter (Signed)
Called and LVM letting patient know Jolene's message.

## 2020-04-10 ENCOUNTER — Emergency Department: Payer: Medicaid Other

## 2020-04-10 ENCOUNTER — Emergency Department
Admission: EM | Admit: 2020-04-10 | Discharge: 2020-04-10 | Disposition: A | Discharge status: Home / Self Care | Payer: Medicaid Other | Attending: Emergency Medicine | Admitting: Emergency Medicine

## 2020-04-10 ENCOUNTER — Other Ambulatory Visit: Payer: Self-pay

## 2020-04-10 DIAGNOSIS — T1490XA Injury, unspecified, initial encounter: Secondary | ICD-10-CM | POA: Diagnosis not present

## 2020-04-10 DIAGNOSIS — Z87891 Personal history of nicotine dependence: Secondary | ICD-10-CM | POA: Insufficient documentation

## 2020-04-10 DIAGNOSIS — J449 Chronic obstructive pulmonary disease, unspecified: Secondary | ICD-10-CM | POA: Diagnosis not present

## 2020-04-10 DIAGNOSIS — M25552 Pain in left hip: Secondary | ICD-10-CM | POA: Diagnosis not present

## 2020-04-10 DIAGNOSIS — Y92410 Unspecified street and highway as the place of occurrence of the external cause: Secondary | ICD-10-CM | POA: Insufficient documentation

## 2020-04-10 DIAGNOSIS — Y9301 Activity, walking, marching and hiking: Secondary | ICD-10-CM | POA: Diagnosis not present

## 2020-04-10 DIAGNOSIS — S72002D Fracture of unspecified part of neck of left femur, subsequent encounter for closed fracture with routine healing: Secondary | ICD-10-CM | POA: Diagnosis not present

## 2020-04-10 DIAGNOSIS — W19XXXA Unspecified fall, initial encounter: Secondary | ICD-10-CM | POA: Diagnosis not present

## 2020-04-10 DIAGNOSIS — Z7951 Long term (current) use of inhaled steroids: Secondary | ICD-10-CM | POA: Insufficient documentation

## 2020-04-10 DIAGNOSIS — S79912A Unspecified injury of left hip, initial encounter: Secondary | ICD-10-CM | POA: Diagnosis not present

## 2020-04-10 DIAGNOSIS — Z96642 Presence of left artificial hip joint: Secondary | ICD-10-CM | POA: Insufficient documentation

## 2020-04-10 MED ORDER — OXYCODONE-ACETAMINOPHEN 5-325 MG PO TABS
1.0000 | ORAL_TABLET | Freq: Once | ORAL | Status: AC
  Administered 2020-04-10: 1 via ORAL
  Filled 2020-04-10: qty 1

## 2020-04-10 MED ORDER — LIDOCAINE 5 % EX PTCH
1.0000 | MEDICATED_PATCH | CUTANEOUS | Status: DC
  Administered 2020-04-10: 1 via TRANSDERMAL
  Filled 2020-04-10: qty 1

## 2020-04-10 MED ORDER — LIDOCAINE 5 % EX PTCH: 1.0000 | MEDICATED_PATCH | Freq: Two times a day (BID) | CUTANEOUS | 0 refills | Status: AC

## 2020-04-10 MED ORDER — CYCLOBENZAPRINE HCL 5 MG PO TABS: 5.0000 mg | ORAL_TABLET | Freq: Three times a day (TID) | ORAL | 0 refills | Status: DC | PRN

## 2020-04-10 MED ORDER — MORPHINE SULFATE (PF) 4 MG/ML IV SOLN
4.0000 mg | Freq: Once | INTRAVENOUS | Status: AC
  Administered 2020-04-10: 4 mg via INTRAVENOUS
  Filled 2020-04-10: qty 1

## 2020-04-10 MED ORDER — ONDANSETRON HCL 4 MG/2ML IJ SOLN
4.0000 mg | Freq: Once | INTRAMUSCULAR | Status: AC
  Administered 2020-04-10: 4 mg via INTRAVENOUS
  Filled 2020-04-10: qty 2

## 2020-04-10 NOTE — ED Triage Notes (Signed)
Pt is a 60 y/o f via EMS who presents with L hip pain for approx 2 days. Pt states she was on the way to the ED waiting at a bus stop when her hip "gave out and felt like it popped out of place". Pt states she fell to the ground and was unable to ambulate and bear weight afterwards. Denies other complaints.

## 2020-04-10 NOTE — ED Notes (Signed)
Patient transported to X-ray 
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
## 2020-04-10 NOTE — ED Provider Notes (Signed)
-----------------------------------------   7:05 AM on 04/10/2020 -----------------------------------------  Blood pressure 133/64, pulse (!) 102, temperature 98.4 F (36.9 C), temperature source Oral, resp. rate 16, height 5\' 5"  (1.651 m), weight 47.6 kg, SpO2 98 %.  Assuming care from Dr. .  In short, Karen Foster is a 60 y.o. female with a chief complaint of Hip Pain .  Refer to the original H&P for additional details.  The current plan of care is to follow-up imaging of hip for possible dislocation.  ----------------------------------------- 9:48 AM on 04/10/2020 -----------------------------------------  X-rays of left hip reviewed by me and show hardware in appropriate positioning with no evidence of acute fracture or dislocation.  CT was performed to rule out occult fracture and is negative.  Following pain medication patient is able to range her left hip and ambulate without difficulty.  I doubt there is any occult fracture that would need to be assessed by MRI.  She is appropriate for discharge home with orthopedics follow-up, has been seen by Dr. 06/08/2020 in the past.  She was counseled to return to the ED for any new or worsening symptoms.  Patient agrees with plan.    Hyacinth Meeker, MD 04/10/20 [phone]

## 2020-08-29 ENCOUNTER — Other Ambulatory Visit: Payer: Self-pay | Admitting: *Deleted

## 2020-08-29 ENCOUNTER — Telehealth: Payer: Self-pay | Admitting: *Deleted

## 2020-08-29 NOTE — Patient Outreach (Signed)
Care Coordination  08/29/2020  Karen Foster 29-Oct-1960 [id number]   Medicaid Managed Care   Unsuccessful Outreach Note  08/29/2020 Name: Karen Foster MRN: [id number] DOB: 1960-09-05  Referred by: Larae Grooms, NP Reason for referral : High Risk Managed Medicaid (Unsuccessful RNCM telephone outreach)   An unsuccessful telephone outreach was attempted today. The patient was referred to the case management team for assistance with care management and care coordination.   Follow Up Plan: A HIPAA compliant phone message was left for the patient providing contact information and requesting a return call.  The care management team will reach out to the patient again over the next 14 days.   Estanislado Emms RN, BSN [place]  [facility]

## 2020-08-29 NOTE — Patient Instructions (Addendum)
Visit Information  Ms. [name]  - as a part of your Medicaid benefit, you are eligible for care management and care coordination services at no cost or copay. I was unable to reach you by phone today but would be happy to help you with your health related needs. Please feel free to call me @ [phone].   A member of the Managed Medicaid care management team will reach out to you again over the next 14 days on 09/17/20 @ 10:30am.   Estanislado Emms RN, BSN [place]  [facility] RN Care Coordinator

## 2020-08-29 NOTE — Patient Outreach (Signed)
Care Coordination  08/29/2020  Karen Foster November 17, 1960 [id number]  [name] left a message on [facility] voicemail apologizing that she had missed 2 calls from [facility]. RNCM returned call to patient. Notified Ms. Boltz of new appointment on 09/17/20 @ 10:30am. Patient agrees to new date and time. Patient shared with RNCM that she may need assistance with finding new housing in the future, but at this time she is fine. RNCM will reach out to [name] at scheduled appointment.  Estanislado Emms RN, BSN [place]  [facility]

## 2020-09-17 ENCOUNTER — Other Ambulatory Visit: Payer: Self-pay | Admitting: *Deleted

## 2020-09-17 ENCOUNTER — Other Ambulatory Visit: Payer: Self-pay

## 2020-09-17 NOTE — Patient Instructions (Signed)
Visit Information  Ms. Barnhill was given information about Medicaid Managed Care team care coordination services as a part of their [facility] Medicaid benefit. [name] verbally consented to engagement with the [facility] Managed Care team.   For questions related to your [facility], please call: [phone] or visit the homepage here: [email or web address]  If you would like to schedule transportation through your [facility], please call the following number at least 2 days in advance of your appointment: [phone].   Call the Behavioral Health Crisis Line at [phone], at any time, 24 hours a day, 7 days a week. If you are in danger or need immediate medical attention call 911.  If you would like help to quit smoking, call 1-800-QUIT-NOW ((754) 846-9413) OR Espaol: 1-855-Djelo-Ya (5-009-381-8299) o para ms informacin haga clic aqu or Text READY to 371-696 to register via text  Ms. Munar - following are the goals we discussed in your visit today:   Goals Addressed             This Visit's Progress    Make and Keep All Appointments       Timeframe:  Short-Term Goal Priority:  High Start Date:  09/17/20                           Expected End Date: 10/18/20                      Follow Up Date 09/25/2020    - call to schedule annual exam with PCP and Pulmonologist - arrange transportation for medical appointments 2-3 days before, with medical transportation provided by [facility] [phone] - call to cancel if needed - keep a calendar with appointment dates - learn the bus route    Why is this important?   Part of staying healthy is seeing the doctor for follow-up care.  If you forget your appointments, there are some things you can do to stay on track.            Please see education materials related to COPD provided by MyChart link.  Patient  verbalizes understanding of instructions provided today.   Telephone follow up appointment with Managed Medicaid care management team member scheduled for:09/25/20 @ 1:30pm  Estanislado Emms RN, BSN [place]  [facility] RN Care Coordinator   Following is a copy of your plan of care:  Patient Care Plan: General Plan of Care (Adult)     Problem Identified: Therapeutic Alliance (General Plan of Care)      Goal: Therapeutic Alliance Established   Start Date: 09/17/2020  Expected End Date: 10/18/2020  This Visit's Progress: On track  Priority: High  Note:    Current Barriers:  Ineffective Self Health Maintenance-Ms. Helm would like to better manage her health. She has started donating plasma and feels that this is encouraging her to take better care of herself. She has not seen her PCP since 05/2019 and does not have all of her medications once prescribed. At last plasma donation her BP 136/81 and weight 134#. She currently smokes 1/2 pack of cigarettes a day and would like to cut back. Her housing is stable at this time and she would like to focus on her health now. Does not attend all scheduled provider appointments Does not adhere to prescribed medication regimen Lacks social connections Does not maintain contact with provider office Does not  contact provider office for questions/concerns Currently UNABLE TO independently self manage needs related to chronic health conditions.  Knowledge Deficits related to short term plan for care coordination needs and long term plans for chronic disease management needs Nurse Case Manager Clinical Goal(s):  patient will work with care management team to address care coordination and chronic disease management needs related to Disease Management   Interventions:  Advised patient to call today and schedule appointment with PCP and pulmonologist Reviewed medications with patient and discussed cutting back on Goody powder and not taking on  an empty stomach Discussed plans with patient for ongoing care management follow up and provided patient with direct contact information for care management team, explained that RNCM will work closely with patient to improve health management Provided patient with MyChart educational materials related to COPD Self Care Activities:  Patient will attend all scheduled provider appointments Patient will continue to perform ADL's independently Patient will continue to perform IADL's independently Patient will call provider office for new concerns or questions Patient Goals: - call to schedule annual exam with PCP and Pulmonologist - arrange transportation for medical appointments 2-3 days before, with medical transportation provided by [facility] [phone] - call to cancel if needed - keep a calendar with appointment dates - learn the bus route  Follow Up Plan: Telephone follow up appointment with care management team member scheduled for:09/25/20 @ 1:30pm

## 2020-09-17 NOTE — Patient Outreach (Signed)
Medicaid Managed Care   Nurse Care Manager Note  09/17/2020 Name:  Karen Foster MRN:  [id number] DOB:  [birth year]  Karen Foster is an 60 y.o. year old female who is a primary patient of Jon Billings, NP.  The [facility] Managed Care Coordination team was consulted for assistance with:    COPD  Karen Foster was given information about Medicaid Managed Care Coordination team services today. [name] agreed to services and verbal consent obtained.  Engaged with patient by telephone for initial visit in response to provider referral for case management and/or care coordination services.   Assessments/Interventions:  Review of past medical history, allergies, medications, health status, including review of consultants reports, laboratory and other test data, was performed as part of comprehensive evaluation and provision of chronic care management services.  SDOH (Social Determinants of Health) assessments and interventions performed: SDOH Interventions    Flowsheet Row Most Recent Value  SDOH Interventions   Housing Interventions Intervention Not Indicated  [Patient has stable housing since March 2022.]  Transportation Interventions Other (Comment)  [medical transportation provided by [facility]  Allergies  Allergen Reactions   Ibuprofen Anaphylaxis   Bee Venom    Hydroxyzine Nausea Only   Sulfa Antibiotics Other (See Comments)   Tomato     Medications Reviewed Today     Reviewed by Karen Montane, RN (Registered Nurse) on 09/17/20 at 1114  Med List Status: <None>   Medication Order Taking? Sig Documenting Provider Last Dose Status Informant  albuterol (PROVENTIL HFA;VENTOLIN HFA) 108 (90 Base) MCG/ACT inhaler [id number] Yes Inhale 2 puffs into the lungs every 6 (six) hours as needed for wheezing or shortness of breath. [name], [place] Taking Active   benzonatate (TESSALON) 200 MG capsule [id number] No TAKE 1 CAPSULE BY MOUTH TWICE DAILY  AS NEEDED FOR COUGH  Patient not taking: Reported on 09/17/2020   [name], NP Not Taking Active   budesonide-formoterol (SYMBICORT) 160-4.5 MCG/ACT inhaler [id number] Yes Inhale 2 puffs into the lungs 2 (two) times daily. [name], [place] Taking Active   cyclobenzaprine (FLEXERIL) 5 MG tablet [id number] No Take 1 tablet (5 mg total) by mouth 3 (three) times daily as needed for muscle spasms.  Patient not taking: Reported on 09/17/2020   [name], MD Not Taking Active   diclofenac sodium (VOLTAREN) 1 % GEL [id number] No Apply 2 g topically 4 (four) times daily as needed.  Patient not taking: Reported on 09/17/2020   [name], PA-C Not Taking Active   DULoxetine (CYMBALTA) 60 MG capsule [id number] No Take 1 capsule (60 mg total) by mouth daily.  Patient not taking: Reported on 09/17/2020   [name], PA-C Not Taking Active   HYDROcodone-acetaminophen (NORCO/VICODIN) 5-325 MG tablet [id number] No Take 1 tablet by mouth every 6 (six) hours as needed for moderate pain.  Patient not taking: Reported on 09/17/2020   [provider] Not Taking Active   hydrocortisone (ANUSOL-HC) 25 MG suppository [id number] No Place 1 suppository (25 mg total) rectally 2 (two) times daily.  Patient not taking: Reported on 09/17/2020   [name], PA-C Not Taking Active   montelukast (SINGULAIR) 10 MG tablet [id number] No Take 1 tablet (10 mg total) by mouth at bedtime.  Patient not taking: Reported on 09/17/2020   [name], PA-C Not Taking Active   [facility] 4 MG/0.1ML LIQD nasal spray kit [id number] No ADMINISTER A SINGLE SPRAY  IN ONE NOSTRIL UPON SIGNS OF OPIOID OVERDOSE. CALL 911. REPEAT AFTER 3 MINUTES IF NO RESPONSE.  Patient not taking: Reported on 09/17/2020   [provider] Not Taking Active   nicotine polacrilex (NICORETTE) 2 MG gum [id number] No Take 1 each (2 mg total) by mouth as needed for smoking cessation.  Patient not  taking: Reported on 09/17/2020   [name], PA-C Not Taking Active   predniSONE (DELTASONE) 10 MG tablet [id number] No Take 6 tabs day one, 5 tabs day two, 4 tabs day three, etc  Patient not taking: Reported on 09/17/2020   [name], PA-C Not Taking Active   pregabalin (LYRICA) 25 MG capsule [id number] No LIMIT 1 CAPSULE BY MOUTH ONCE TO TWICE DAILY IF TOLERATED  Patient not taking: Reported on 09/17/2020   [provider] Not Taking Active   QUEtiapine (SEROQUEL) 100 MG tablet [id number] No Take 1 tablet (100 mg total) by mouth at bedtime.  Patient not taking: Reported on 09/17/2020   [name], NP Not Taking Active   QUEtiapine (SEROQUEL) 25 MG tablet [id number] No Take 1-2 tablets every morning as needed. Continue taking 50 mg tablet nightly in addition  Patient not taking: Reported on 09/17/2020   [name], PA-C Not Taking Active   tiotropium [facility] HANDIHALER) 18 MCG inhalation capsule [id number] No Place 1 capsule (18 mcg total) into inhaler and inhale daily.  Patient not taking: Reported on 09/17/2020   [name], [place] Not Taking Active   triamcinolone cream (KENALOG) 0.1 % [id number] No APPLY TOPICALLY TWICE DAILY  Patient not taking: Reported on 09/17/2020   [name], PA-C Not Taking Active             Patient Active Problem List   Diagnosis Date Noted   Allergic rhinitis 05/26/2019   Urticaria 12/01/2018   COPD mixed type ([place]) 12/01/2018   Cigarette smoker 06/08/2018   Arthritis 02/19/2018   Hemorrhoids 02/18/2018   Reactive airway disease 02/18/2018   Migraines 12/12/2017   Deviated septum 12/03/2017   Heart murmur 12/03/2017   Depression 12/03/2017   Panic disorder     Conditions to be addressed/monitored per PCP order:  COPD  Care Plan : General Plan of Care (Adult)  Updates made by Karen Montane, RN since 09/17/2020 12:00 AM     Problem: Therapeutic Alliance (General Plan of Care)       Goal: Therapeutic Alliance Established   Start Date: 09/17/2020  Expected End Date: 10/18/2020  This Visit's Progress: On track  Priority: High  Note:    Current Barriers:  Ineffective Self Health Maintenance-Karen Foster would like to better manage her health. She has started donating plasma and feels that this is encouraging her to take better care of herself. She has not seen her PCP since 05/2019 and does not have all of her medications once prescribed. At last plasma donation her BP 136/81 and weight 134#. She currently smokes 1/2 pack of cigarettes a day and would like to cut back. Her housing is stable at this time and she would like to focus on her health now. Does not attend all scheduled provider appointments Does not adhere to prescribed medication regimen Lacks social connections Does not maintain contact with provider office Does not contact provider office for questions/concerns Currently UNABLE TO independently self manage needs related to chronic health conditions.  Knowledge Deficits related to short term plan for care coordination needs and long term plans for chronic disease management  needs Nurse Case Manager Clinical Goal(s):  patient will work with care management team to address care coordination and chronic disease management needs related to Disease Management   Interventions:  Advised patient to call today and schedule appointment with PCP and pulmonologist Reviewed medications with patient and discussed cutting back on Goody powder and not taking on an empty stomach Discussed plans with patient for ongoing care management follow up and provided patient with direct contact information for care management team, explained that RNCM will work closely with patient to improve health management Provided patient with MyChart educational materials related to COPD Self Care Activities:  Patient will attend all scheduled provider appointments Patient will continue to perform  ADL's independently Patient will continue to perform IADL's independently Patient will call provider office for new concerns or questions Patient Goals: - call to schedule annual exam with PCP and Pulmonologist - arrange transportation for medical appointments 2-3 days before, with medical transportation provided by [facility] [phone] - call to cancel if needed - keep a calendar with appointment dates - learn the bus route  Follow Up Plan: Telephone follow up appointment with care management team member scheduled for:09/25/20 @ 1:30pm      Follow Up:  Patient agrees to Care Plan and Follow-up.  Plan: The Managed Medicaid care management team will reach out to the patient again over the next 7 days.  Date/time of next scheduled RN care management/care coordination outreach:  09/25/20 @ 1:30pm  Lurena Joiner RN, [place] RN Care Coordinator

## 2020-09-25 ENCOUNTER — Other Ambulatory Visit: Payer: Self-pay | Admitting: *Deleted

## 2020-09-25 ENCOUNTER — Other Ambulatory Visit: Payer: Self-pay

## 2020-09-25 NOTE — Patient Outreach (Signed)
Care Coordination  09/25/2020  Karen Foster 1960/10/26 [id number]  Successful outreach with [name] today. However, she is at the hospital supporting a friend and requested to reschedule this visit. A new appointment was made for 10/22/20 @ 2:30pm. Patient agreed to new date and time. RNCM reminded [name] to schedule a PCP and Pulmonology appointment. Discussed that she may need a new referral to Pulmonology. [name] agreed and plans to call and schedule a PCP appointment first.  Estanislado Emms RN, BSN [place]  [facility] RN Care Coordinator

## 2020-10-22 ENCOUNTER — Other Ambulatory Visit: Payer: Self-pay

## 2020-10-22 ENCOUNTER — Other Ambulatory Visit: Payer: Self-pay | Admitting: *Deleted

## 2020-10-22 NOTE — Patient Outreach (Signed)
Medicaid Managed Care   Nurse Care Manager Note  10/22/2020 Name:  Karen Foster MRN:  [id number] DOB:  1960/05/01  Karen Foster is an 60 y.o. year old female who is a primary patient of Karen Billings, NP.  The [facility] Managed Care Coordination team was consulted for assistance with:    COPD  [name] was given information about Medicaid Managed Care Coordination team services today. Karen Foster Patient agreed to services and verbal consent obtained.  Engaged with patient by telephone for follow up visit in response to provider referral for case management and/or care coordination services.   Assessments/Interventions:  Review of past medical history, allergies, medications, health status, including review of consultants reports, laboratory and other test data, was performed as part of comprehensive evaluation and provision of chronic care management services.  SDOH (Social Determinants of Health) assessments and interventions performed:   Care Plan  Allergies  Allergen Reactions   Ibuprofen Anaphylaxis   Bee Venom    Hydroxyzine Nausea Only   Sulfa Antibiotics Other (See Comments)   Tomato     Medications Reviewed Today     Reviewed by Karen Montane, RN (Registered Nurse) on 10/22/20 at 1209  Med List Status: <None>   Medication Order Taking? Sig Documenting Provider Last Dose Status Informant  albuterol (PROVENTIL HFA;VENTOLIN HFA) 108 (90 Base) MCG/ACT inhaler [id number] No Inhale 2 puffs into the lungs every 6 (six) hours as needed for wheezing or shortness of breath. Karen Foster, [place] Taking Active   benzonatate (TESSALON) 200 MG capsule [id number] No TAKE 1 CAPSULE BY MOUTH TWICE DAILY AS NEEDED FOR COUGH  Patient not taking: Reported on 09/17/2020   Karen Foster T, NP Not Taking Active   budesonide-formoterol (SYMBICORT) 160-4.5 MCG/ACT inhaler [id number] No Inhale 2 puffs into the lungs 2 (two) times daily. Karen Foster, [place] Taking Active    cyclobenzaprine (FLEXERIL) 5 MG tablet [id number] No Take 1 tablet (5 mg total) by mouth 3 (three) times daily as needed for muscle spasms.  Patient not taking: Reported on 09/17/2020   Karen Divine, MD Not Taking Active   diclofenac sodium (VOLTAREN) 1 % GEL [id number] No Apply 2 g topically 4 (four) times daily as needed.  Patient not taking: Reported on 09/17/2020   Karen American, PA-C Not Taking Active   DULoxetine (CYMBALTA) 60 MG capsule [id number] No Take 1 capsule (60 mg total) by mouth daily.  Patient not taking: Reported on 09/17/2020   Karen American, PA-C Not Taking Active   HYDROcodone-acetaminophen (NORCO/VICODIN) 5-325 MG tablet [id number] No Take 1 tablet by mouth every 6 (six) hours as needed for moderate pain.  Patient not taking: Reported on 09/17/2020   [provider] Not Taking Active   hydrocortisone (ANUSOL-HC) 25 MG suppository [id number] No Place 1 suppository (25 mg total) rectally 2 (two) times daily.  Patient not taking: Reported on 09/17/2020   Karen American, PA-C Not Taking Active   montelukast (SINGULAIR) 10 MG tablet [id number] No Take 1 tablet (10 mg total) by mouth at bedtime.  Patient not taking: Reported on 09/17/2020   Karen American, PA-C Not Taking Active   NARCAN 4 MG/0.1ML LIQD nasal spray kit [id number] No ADMINISTER A SINGLE SPRAY IN ONE NOSTRIL UPON SIGNS OF OPIOID OVERDOSE. CALL 911. REPEAT AFTER 3 MINUTES IF NO RESPONSE.  Patient not taking: Reported on 09/17/2020   [provider] Not Taking Active   nicotine polacrilex (NICORETTE) 2 MG gum [id number] No  Take 1 each (2 mg total) by mouth as needed for smoking cessation.  Patient not taking: Reported on 09/17/2020   Karen American, PA-C Not Taking Active   predniSONE (DELTASONE) 10 MG tablet [id number] No Take 6 tabs day one, 5 tabs day two, 4 tabs day three, etc  Patient not taking: Reported on 09/17/2020   Karen American, PA-C Not Taking  Active   pregabalin (LYRICA) 25 MG capsule [id number] No LIMIT 1 CAPSULE BY MOUTH ONCE TO TWICE DAILY IF TOLERATED  Patient not taking: Reported on 09/17/2020   [provider] Not Taking Active   QUEtiapine (SEROQUEL) 100 MG tablet [id number] No Take 1 tablet (100 mg total) by mouth at bedtime.  Patient not taking: Reported on 09/17/2020   Karen Haddock, NP Not Taking Active   QUEtiapine (SEROQUEL) 25 MG tablet [id number] No Take 1-2 tablets every morning as needed. Continue taking 50 mg tablet nightly in addition  Patient not taking: Reported on 09/17/2020   Karen American, PA-C Not Taking Active   tiotropium [facility] HANDIHALER) 18 MCG inhalation capsule [id number] No Place 1 capsule (18 mcg total) into inhaler and inhale daily.  Patient not taking: Reported on 09/17/2020   Karen Foster, [place] Not Taking Active   triamcinolone cream (KENALOG) 0.1 % [id number] No APPLY TOPICALLY TWICE DAILY  Patient not taking: Reported on 09/17/2020   Karen American, PA-C Not Taking Active             Patient Active Problem List   Diagnosis Date Noted   Allergic rhinitis 05/26/2019   Urticaria 12/01/2018   COPD mixed type ([place]) 12/01/2018   Cigarette smoker 06/08/2018   Arthritis 02/19/2018   Hemorrhoids 02/18/2018   Reactive airway disease 02/18/2018   Migraines 12/12/2017   Deviated septum 12/03/2017   Heart murmur 12/03/2017   Depression 12/03/2017   Panic disorder     Conditions to be addressed/monitored per PCP order:  COPD  Care Plan : General Plan of Care (Adult)  Updates made by Karen Montane, RN since 10/22/2020 12:00 AM     Problem: Therapeutic Alliance (General Plan of Care)      Long-Range Goal: Therapeutic Alliance Established   Start Date: 09/17/2020  Expected End Date: 12/24/2020  Recent Progress: On track  Priority: High  Note:    Current Barriers:  Ineffective Self Health Maintenance-Ms. Hattery would like to better manage her health.  She has started donating plasma and feels that this is encouraging her to take better care of herself. She has not seen her PCP since 05/2019 and does not have all of her medications once prescribed. At last plasma donation her BP 136/81 and weight 134#. She currently smokes 1/2 pack of cigarettes a day and would like to cut back. Her housing is stable at this time and she would like to focus on her health now.-Update- Ms. Mathe has not scheduled a PCP appointment. She is in the process of switching insurance plans and wanted to make sure her current provider office would accept new plan. Patient is volunteering in the mornings and working on finding a job. She was grocery shopping today during our telephone visit. Does not attend all scheduled provider appointments Does not adhere to prescribed medication regimen Lacks social connections Does not maintain contact with provider office Does not contact provider office for questions/concerns Currently UNABLE TO independently self manage needs related to chronic health conditions.  Knowledge Deficits related to short term plan for care coordination  needs and long term plans for chronic disease management needs Nurse Case Manager Clinical Goal(s):  patient will work with care management team to address care coordination and chronic disease management needs related to Disease Management   Interventions:  Advised patient to call today and schedule appointment with PCP and pulmonologist Advised to discuss Ophthalmology referral with PCP Reset MyChart password Provided encouragement to patient for volunteering  Discussed plans with patient for ongoing care management follow up and provided patient with direct contact information for care management team, explained that RNCM will work closely with patient to improve health management Provided patient with MyChart educational materials related to COPD Self Care Activities:  Patient will attend all scheduled  provider appointments Patient will continue to perform ADL's independently Patient will continue to perform IADL's independently Patient will call provider office for new concerns or questions Patient Goals: - call to schedule annual exam with PCP and Pulmonologist - arrange transportation for medical appointments 2-3 days before, with medical transportation provided by [facility] [phone] - call to cancel if needed - keep a calendar with appointment dates - learn the bus route  Follow Up Plan: Telephone follow up appointment with care management team member scheduled for:12/24/20 @ 3:30pm      Follow Up:  Patient agrees to Care Plan and Follow-up.  Plan: The Managed Medicaid care management team will reach out to the patient again over the next 60 days.  Date/time of next scheduled RN care management/care coordination outreach:  12/24/20 @ 3:30pm  Lurena Joiner RN, BSN [place] RN Care Coordinator

## 2020-10-22 NOTE — Patient Instructions (Signed)
Visit Information  Karen Foster was given information about Medicaid Managed Care team care coordination services as a part of their [facility] Community Plan Medicaid benefit. Karen Foster verbally consented to engagement with the [facility] Managed Care team.   If you are experiencing a medical emergency, please call 911 or report to your local emergency department or urgent care.   If you have a non-emergency medical problem during routine business hours, please contact your provider's office and ask to speak with a nurse.   For questions related to your [facility], please call: [phone] or visit the homepage here: [email or web address]  If you would like to schedule transportation through your [facility], please call the following number at least 2 days in advance of your appointment: [phone].   Call the Behavioral Health Crisis Line at 8284805834, at any time, 24 hours a day, 7 days a week. If you are in danger or need immediate medical attention call 911.  If you would like help to quit smoking, call 1-800-QUIT-NOW ((906)594-5705) OR Espaol: 1-855-Djelo-Ya (6-811-572-6203) o para ms informacin haga clic aqu or Text READY to 559-741 to register via text  Karen Foster - following are the goals we discussed in your visit today:   Goals Addressed             This Visit's Progress    Make and Keep All Appointments       Timeframe:  Long-Range Goal Priority:  High Start Date:  09/17/20                           Expected End Date: 12/24/20                      Follow Up Date 12/24/2020    - call to schedule annual exam with PCP and Pulmonologist - schedule an eye exam - arrange transportation for medical appointments 2-3 days before, with medical transportation provided by [facility] [phone] - call to cancel if needed - keep a calendar with appointment dates -  learn the bus route    Why is this important?   Part of staying healthy is seeing the doctor for follow-up care.  If you forget your appointments, there are some things you can do to stay on track.            Please see education materials related to smoking and COPD provided by MyChart link.  Patient has MyChart and will view information via MyChart  Telephone follow up appointment with Managed Medicaid care management team member scheduled for:12/24/20 @ 3:30pm  Estanislado Emms RN, BSN [place]  [facility] RN Care Coordinator   Following is a copy of your plan of care:  Patient Care Plan: General Plan of Care (Adult)     Problem Identified: Therapeutic Alliance (General Plan of Care)      Long-Range Goal: Therapeutic Alliance Established   Start Date: 09/17/2020  Expected End Date: 12/24/2020  Recent Progress: On track  Priority: High  Note:    Current Barriers:  Ineffective Self Health Maintenance-Karen Foster would like to better manage her health. She has started donating plasma and feels that this is encouraging her to take better care of herself. She has not seen her PCP since 05/2019 and does not have all of her medications once prescribed. At last plasma donation her BP 136/81 and weight 134#. She currently smokes  1/2 pack of cigarettes a day and would like to cut back. Her housing is stable at this time and she would like to focus on her health now.-Update- Karen Foster has not scheduled a PCP appointment. She is in the process of switching insurance plans and wanted to make sure her current provider office would accept new plan. Patient is volunteering in the mornings and working on finding a job. She was grocery shopping today during our telephone visit. Does not attend all scheduled provider appointments Does not adhere to prescribed medication regimen Lacks social connections Does not maintain contact with provider office Does not contact provider office  for questions/concerns Currently UNABLE TO independently self manage needs related to chronic health conditions.  Knowledge Deficits related to short term plan for care coordination needs and long term plans for chronic disease management needs Nurse Case Manager Clinical Goal(s):  patient will work with care management team to address care coordination and chronic disease management needs related to Disease Management   Interventions:  Advised patient to call today and schedule appointment with PCP and pulmonologist Advised to discuss Ophthalmology referral with PCP Reset MyChart password Provided encouragement to patient for volunteering  Discussed plans with patient for ongoing care management follow up and provided patient with direct contact information for care management team, explained that RNCM will work closely with patient to improve health management Provided patient with MyChart educational materials related to COPD Self Care Activities:  Patient will attend all scheduled provider appointments Patient will continue to perform ADL's independently Patient will continue to perform IADL's independently Patient will call provider office for new concerns or questions Patient Goals: - call to schedule annual exam with PCP and Pulmonologist - arrange transportation for medical appointments 2-3 days before, with medical transportation provided by [facility] [phone] - call to cancel if needed - keep a calendar with appointment dates - learn the bus route  Follow Up Plan: Telephone follow up appointment with care management team member scheduled for:12/24/20 @ 3:30pm

## 2020-10-31 DIAGNOSIS — F411 Generalized anxiety disorder: Secondary | ICD-10-CM | POA: Diagnosis not present

## 2020-10-31 DIAGNOSIS — Z419 Encounter for procedure for purposes other than remedying health state, unspecified: Secondary | ICD-10-CM | POA: Diagnosis not present

## 2020-11-14 NOTE — Progress Notes (Signed)
There were no vitals taken for this visit.   Subjective:    Patient ID: Karen Foster, female    DOB: 03-Oct-1960, 60 y.o.   MRN: [id number]  HPI: Karen Foster is a 60 y.o. female  Chief Complaint  Patient presents with   Emphysema   Cough   Patient states she has a prolapsed mitral valve and is requesting a beta blocker to help with her HR because when she has been donating plasma.  Patient states she has quit smoking.  She is still coughing and requesting tessalon.  Patient is also requesting her Seroquel.  She takes 25mg  in the morning and 100mg  in the evening.  She would like a refill on her medication. Patient states she was homeless, then in a shelter and now she is in a boarding house.    Relevant past medical, surgical, family and social history reviewed and updated as indicated. Interim medical history since our last visit reviewed. Allergies and medications reviewed and updated.  Review of Systems  Respiratory:  Positive for cough.   Cardiovascular:        Tachycardia  Psychiatric/Behavioral:  Positive for dysphoric mood.    Per HPI unless specifically indicated above     Objective:    There were no vitals taken for this visit.  Wt Readings from Last 3 Encounters:  04/10/20 105 lb (47.6 kg)  12/01/18 123 lb (55.8 kg)  07/13/18 114 lb (51.7 kg)    Physical Exam Vitals and nursing note reviewed.  Pulmonary:     Effort: Pulmonary effort is normal. No respiratory distress.  Neurological:     Mental Status: She is alert.  Psychiatric:        Mood and Affect: Mood normal.        Behavior: Behavior normal.        Thought Content: Thought content normal.        Judgment: Judgment normal.    Results for orders placed or performed in visit on 02/18/18  HgB A1c  Result Value Ref Range   Hgb A1c MFr Bld 5.3 4.8 - 5.6 %   Est. average glucose Bld gHb Est-mCnc 105 mg/dL  Basic Metabolic Panel (BMET)  Result Value Ref Range   Glucose 90 65 - 99 mg/dL   BUN 20  6 - 24 mg/dL   Creatinine, Ser 07/15/18 0.57 - 1.00 mg/dL   GFR calc non Af Amer 81 >59 mL/min/1.73   GFR calc Af Amer 94 >59 mL/min/1.73   BUN/Creatinine Ratio 25 (H) 9 - 23   Sodium 140 134 - 144 mmol/L   Potassium 4.0 3.5 - 5.2 mmol/L   Chloride 104 96 - 106 mmol/L   CO2 22 20 - 29 mmol/L   Calcium 9.0 8.7 - 10.2 mg/dL  UA/M w/rflx Culture, Routine   Specimen: Urine   URINE  Result Value Ref Range   Specific Gravity, UA 1.025 1.005 - 1.030   pH, UA 5.5 5.0 - 7.5   Color, UA Yellow Yellow   Appearance Ur Clear Clear   Leukocytes, UA Negative Negative   Protein, UA Negative Negative/Trace   Glucose, UA Negative Negative   Ketones, UA Negative Negative   RBC, UA Negative Negative   Bilirubin, UA Negative Negative   Urobilinogen, Ur 0.2 0.2 - 1.0 mg/dL   Nitrite, UA Negative Negative      Assessment & Plan:   Problem List Items Addressed This Visit       Respiratory  Reactive airway disease   Relevant Medications   benzonatate (TESSALON) 200 MG capsule   COPD mixed type (HCC) - Primary    Chronic. Ongoing. Patient has been out of inhalers. Requesting refill of albuterol. Not able to afford Spiriva at this time. Will follow up in 1 month to evaluate breathing. May need spirometry at that time.       Relevant Medications   benzonatate (TESSALON) 200 MG capsule   albuterol (VENTOLIN HFA) 108 (90 Base) MCG/ACT inhaler     Other   Depression    Chronic. Seroquel refilled for patient during visit today. Discussed side effects and benefits of medication with patient during visit.  Follow up in 1 month for revaluation.      Other Visit Diagnoses     Mitral valve prolapse       Referral placed for patient to see Cardiology. Will have to have updated imaging from Cardiology and vitals in office before getting a beta blocker.   Relevant Orders   Ambulatory referral to Cardiology        Follow up plan: Return in about 1 month (around 12/15/2020) for In office blood  pressure and depression check, COPD.  This visit was completed via MyChart due to the restrictions of the COVID-19 pandemic. All issues as above were discussed and addressed. Physical exam was done as above through visual confirmation on MyChart. If it was felt that the patient should be evaluated in the office, they were directed there. The patient verbally consented to this visit. Location of the patient: Home Location of the provider: Office Those involved with this call:  Provider: Larae Grooms, NP CMA: Randa Lynn, CMA Front Desk/Registration: Channing Mutters This encounter was conducted via phone.  I spent 20 dedicated to the care of this patient on the date of this encounter to include previsit review of 30, face to face time with the patient, and post visit ordering of testing.

## 2020-11-15 ENCOUNTER — Telehealth (INDEPENDENT_AMBULATORY_CARE_PROVIDER_SITE_OTHER): Payer: Medicaid Other | Admitting: Nurse Practitioner

## 2020-11-15 ENCOUNTER — Encounter: Payer: Self-pay | Admitting: Nurse Practitioner

## 2020-11-15 DIAGNOSIS — J4540 Moderate persistent asthma, uncomplicated: Secondary | ICD-10-CM | POA: Diagnosis not present

## 2020-11-15 DIAGNOSIS — J449 Chronic obstructive pulmonary disease, unspecified: Secondary | ICD-10-CM | POA: Diagnosis not present

## 2020-11-15 DIAGNOSIS — I341 Nonrheumatic mitral (valve) prolapse: Secondary | ICD-10-CM | POA: Diagnosis not present

## 2020-11-15 DIAGNOSIS — F332 Major depressive disorder, recurrent severe without psychotic features: Secondary | ICD-10-CM

## 2020-11-15 MED ORDER — ALBUTEROL SULFATE HFA 108 (90 BASE) MCG/ACT IN AERS: 2.0000 | INHALATION_SPRAY | Freq: Four times a day (QID) | RESPIRATORY_TRACT | 3 refills | Status: DC | PRN

## 2020-11-15 MED ORDER — QUETIAPINE FUMARATE 25 MG PO TABS: ORAL_TABLET | ORAL | 0 refills | Status: DC

## 2020-11-15 MED ORDER — QUETIAPINE FUMARATE 100 MG PO TABS: 100.0000 mg | ORAL_TABLET | Freq: Every day | ORAL | 0 refills | Status: DC

## 2020-11-15 MED ORDER — BENZONATATE 200 MG PO CAPS: ORAL_CAPSULE | ORAL | 0 refills | Status: DC

## 2020-11-15 NOTE — Assessment & Plan Note (Signed)
Chronic. Seroquel refilled for patient during visit today. Discussed side effects and benefits of medication with patient during visit.  Follow up in 1 month for revaluation.

## 2020-11-15 NOTE — Assessment & Plan Note (Signed)
Chronic. Ongoing. Patient has been out of inhalers. Requesting refill of albuterol. Not able to afford Spiriva at this time. Will follow up in 1 month to evaluate breathing. May need spirometry at that time.

## 2020-11-18 ENCOUNTER — Telehealth: Payer: Self-pay | Admitting: Nurse Practitioner

## 2020-11-18 NOTE — Telephone Encounter (Signed)
Can you assist her in getting an appointment with Cardiology?

## 2020-11-18 NOTE — Telephone Encounter (Signed)
Called pt and stated that she is not ready to make an appointment at this time.  (Per [employer])

## 2020-11-18 NOTE — Telephone Encounter (Signed)
-----   Message from Larae Grooms, NP sent at 11/15/2020 11:41 AM EDT ----- Can we make her a in office follow up?

## 2020-11-20 NOTE — Telephone Encounter (Signed)
Pt called stating that she scheduled appt with cardiology as advised, but they have advised her that she will need to have the orders for the echocardiogram provided by PCP. Cardiology states that they would like to have the results at the first appt to make things run smoother. Please advise.

## 2020-11-21 NOTE — Telephone Encounter (Signed)
I spoke with the Cardiology office they will order the Echo at her appointment.

## 2020-11-22 DIAGNOSIS — F4310 Post-traumatic stress disorder, unspecified: Secondary | ICD-10-CM | POA: Diagnosis not present

## 2020-11-22 DIAGNOSIS — F411 Generalized anxiety disorder: Secondary | ICD-10-CM | POA: Diagnosis not present

## 2020-11-23 DIAGNOSIS — F411 Generalized anxiety disorder: Secondary | ICD-10-CM | POA: Diagnosis not present

## 2020-11-24 DIAGNOSIS — F411 Generalized anxiety disorder: Secondary | ICD-10-CM | POA: Diagnosis not present

## 2020-11-26 ENCOUNTER — Other Ambulatory Visit: Payer: Self-pay

## 2020-11-27 ENCOUNTER — Other Ambulatory Visit: Payer: Self-pay

## 2020-11-27 ENCOUNTER — Other Ambulatory Visit: Payer: Self-pay | Admitting: Nurse Practitioner

## 2020-11-27 MED ORDER — QUETIAPINE FUMARATE 25 MG PO TABS: ORAL_TABLET | ORAL | 0 refills | Status: DC

## 2020-11-30 DIAGNOSIS — F411 Generalized anxiety disorder: Secondary | ICD-10-CM | POA: Diagnosis not present

## 2020-11-30 DIAGNOSIS — F4310 Post-traumatic stress disorder, unspecified: Secondary | ICD-10-CM | POA: Diagnosis not present

## 2020-11-30 DIAGNOSIS — Z419 Encounter for procedure for purposes other than remedying health state, unspecified: Secondary | ICD-10-CM | POA: Diagnosis not present

## 2020-12-01 DIAGNOSIS — F4310 Post-traumatic stress disorder, unspecified: Secondary | ICD-10-CM | POA: Diagnosis not present

## 2020-12-01 DIAGNOSIS — F411 Generalized anxiety disorder: Secondary | ICD-10-CM | POA: Diagnosis not present

## 2020-12-06 DIAGNOSIS — F411 Generalized anxiety disorder: Secondary | ICD-10-CM | POA: Diagnosis not present

## 2020-12-06 DIAGNOSIS — F4310 Post-traumatic stress disorder, unspecified: Secondary | ICD-10-CM | POA: Diagnosis not present

## 2020-12-07 DIAGNOSIS — F4310 Post-traumatic stress disorder, unspecified: Secondary | ICD-10-CM | POA: Diagnosis not present

## 2020-12-07 DIAGNOSIS — F411 Generalized anxiety disorder: Secondary | ICD-10-CM | POA: Diagnosis not present

## 2020-12-08 DIAGNOSIS — F411 Generalized anxiety disorder: Secondary | ICD-10-CM | POA: Diagnosis not present

## 2020-12-08 DIAGNOSIS — F4310 Post-traumatic stress disorder, unspecified: Secondary | ICD-10-CM | POA: Diagnosis not present

## 2020-12-10 ENCOUNTER — Other Ambulatory Visit: Payer: Self-pay | Admitting: Family Medicine

## 2020-12-10 ENCOUNTER — Other Ambulatory Visit: Payer: Self-pay | Admitting: Nurse Practitioner

## 2020-12-10 ENCOUNTER — Other Ambulatory Visit: Payer: Self-pay

## 2020-12-10 DIAGNOSIS — J4540 Moderate persistent asthma, uncomplicated: Secondary | ICD-10-CM

## 2020-12-11 NOTE — Telephone Encounter (Signed)
Patient called and advised she will need an office visit as noted in last OV note to return to office for follow up. Patient says Clydie Braun wants to see her after cardiology appt on 12/23/20 @ 1500. Pt states her medications will run out in a few days due to them being refilled on 11/15/20 x 1 mo. Appointment scheduled for 12/25/20 at 1400 with Larae Grooms, NP. Advised refill will be sent to provider for approval.

## 2020-12-11 NOTE — Telephone Encounter (Signed)
Requested medication (s) are due for refill today: yes  Requested medication (s) are on the active medication list: yes  Last refill:  09/11/2019  Future visit scheduled: yes  Notes to clinic:  Unable to refill per protocol, appointment needed. Already scheduled appt     Requested Prescriptions  Pending Prescriptions Disp Refills   triamcinolone cream (KENALOG) 0.1 % [Pharmacy Med Name: Triamcinolone Acetonide 0.1 % External Cream] 60 g 0    Sig: APPLY TOPICALLY TWICE DAILY     Dermatology:  Corticosteroids Failed - 12/10/2020 12:56 PM      Failed - Valid encounter within last 12 months    Recent Outpatient Visits           3 weeks ago COPD mixed type [facility])   [facility] Larae Grooms, NP   1 year ago Cigarette smoker   [facility] Part[place], [place]   1 year ago Chronic cough   [facility] [place], [place]   2 years ago Flu vaccine need   [facility] Gabriel Cirri, NP   2 years ago Erroneous encounter - disregard   [facility] [place], [place]       Future Appointments             In 1 week Agbor-Etang, Arlys John, MD [facility], [facility]   In 2 weeks Larae Grooms, NP [facility], [facility]

## 2020-12-11 NOTE — Telephone Encounter (Signed)
Requested medication (s) are due for refill today:Yes  Requested medication (s) are on the active medication list: Yes  Last refill:  11/15/20  Future visit scheduled: Yes  Notes to clinic:  Unable to refill per protocol, cannot delegate. Pt scheduled, send to provider for approval     Requested Prescriptions  Pending Prescriptions Disp Refills   benzonatate (TESSALON) 200 MG capsule [Pharmacy Med Name: Benzonatate 200 MG Oral Capsule] 60 capsule 0    Sig: TAKE 1 CAPSULE BY MOUTH TWICE DAILY AS NEEDED FOR COUGH     Ear, Nose, and Throat:  Antitussives/Expectorants Failed - 12/10/2020 12:56 PM      Failed - Valid encounter within last 12 months    Recent Outpatient Visits           3 weeks ago COPD mixed type (HCC)   [facility] Larae Grooms, NP   1 year ago Cigarette smoker   [facility] [place], [place], [place]   1 year ago Chronic cough   [facility] Roosvelt Maser [place], [place]   2 years ago Flu vaccine need   [facility] Gabriel Cirri, NP   2 years ago Erroneous encounter - disregard   [facility] Particia [place], [place]       Future Appointments             In 1 week Agbor-Etang, Arlys John, MD [facility], [facility]   In 2 weeks Larae Grooms, NP [facility], [facility]             QUEtiapine (SEROQUEL) 100 MG tablet [Pharmacy Med Name: QUEtiapine Fumarate 100 MG Oral Tablet] 30 tablet 0    Sig: TAKE 1 TABLET BY MOUTH AT BEDTIME     Not Delegated - Psychiatry:  Antipsychotics - Second Generation (Atypical) - quetiapine Failed - 12/10/2020 12:56 PM      Failed - This refill cannot be delegated      Failed - ALT in normal range and within 180 days    ALT  Date Value Ref Range Status  02/04/2015 7 (L) 14 - 54 U/L Final          Failed - AST in normal range and within 180 days    AST  Date Value Ref Range Status  02/04/2015 14 (L) 15 - 41 U/L Final           Failed - Completed PHQ-2 or PHQ-9 in the last 360 days      Failed - Valid encounter within last 6 months    Recent Outpatient Visits           3 weeks ago COPD mixed type [facility])   [facility] Larae Grooms, NP   1 year ago Cigarette smoker   [facility] [place], [place], [place]   1 year ago Chronic cough   [facility] Roosvelt Maser [place], [place]   2 years ago Flu vaccine need   [facility] Gabriel Cirri, NP   2 years ago Erroneous encounter - disregard   [facility] Particia [place], [place]       Future Appointments             In 1 week Agbor-Etang, Arlys John, MD [facility], [facility]   In 2 weeks Larae Grooms, NP [facility], [facility]            Passed - Last BP in normal range    BP Readings  from Last 1 Encounters:  04/10/20 122/83

## 2020-12-14 DIAGNOSIS — F411 Generalized anxiety disorder: Secondary | ICD-10-CM | POA: Diagnosis not present

## 2020-12-14 DIAGNOSIS — F4310 Post-traumatic stress disorder, unspecified: Secondary | ICD-10-CM | POA: Diagnosis not present

## 2020-12-15 DIAGNOSIS — F411 Generalized anxiety disorder: Secondary | ICD-10-CM | POA: Diagnosis not present

## 2020-12-15 DIAGNOSIS — F4310 Post-traumatic stress disorder, unspecified: Secondary | ICD-10-CM | POA: Diagnosis not present

## 2020-12-18 ENCOUNTER — Ambulatory Visit: Payer: Self-pay | Admitting: *Deleted

## 2020-12-18 NOTE — Telephone Encounter (Signed)
Patient has noticed her right hand will have an occasional twitch/tingling for a brief few seconds while she is using it on her phone (unable to say how long but from 15 minutes to about one hour most likely). Reported b/p today 120/60's and HR 89 just before plasma donation today.Denies CP/SOB/vision change/speech changes/dizziness/weakness and denies any other tingling. Care Advice including take a minute break about every 10 minutes from using the hand. Also, switch up the angle of the right hand while using the phone. Stretch the fingers and hand often while holding the phone. Agrees with this plan. Reviewed urgent symptoms that would need immediate evaluation if noticed. Patient agrees with plan.     Reason for Disposition  [1] Tingling in hand (e.g., pins and needles) AND [2] after prolonged lying on arm AND [3]  brief (now gone)  Answer Assessment - Initial Assessment Questions 1. SYMPTOM: "What is the main symptom you are concerned about?" (e.g., weakness, numbness)     Right hand twitch/jerk  2. ONSET: "When did this start?" (minutes, hours, days; while sleeping)     Several days ago. 3. LAST NORMAL: "When was the last time you (the patient) were normal (no symptoms)?"     Before sunday 4. PATTERN "Does this come and go, or has it been constant since it started?"  "Is it present now?"     Comes and goes. Occurred today 5. CARDIAC SYMPTOMS: "Have you had any of the following symptoms: chest pain, difficulty breathing, palpitations?"     none 6. NEUROLOGIC SYMPTOMS: "Have you had any of the following symptoms: headache, dizziness, vision loss, double vision, changes in speech, unsteady on your feet?"     no 7. OTHER SYMPTOMS: "Do you have any other symptoms?"     no 8. PREGNANCY: "Is there any chance you are pregnant?" "When was your last menstrual period?"     na  Protocols used: Neurologic Deficit-A-AH

## 2020-12-21 DIAGNOSIS — F4310 Post-traumatic stress disorder, unspecified: Secondary | ICD-10-CM | POA: Diagnosis not present

## 2020-12-21 DIAGNOSIS — F411 Generalized anxiety disorder: Secondary | ICD-10-CM | POA: Diagnosis not present

## 2020-12-22 DIAGNOSIS — F4310 Post-traumatic stress disorder, unspecified: Secondary | ICD-10-CM | POA: Diagnosis not present

## 2020-12-22 DIAGNOSIS — F411 Generalized anxiety disorder: Secondary | ICD-10-CM | POA: Diagnosis not present

## 2020-12-23 ENCOUNTER — Ambulatory Visit: Payer: Self-pay | Admitting: Cardiology

## 2020-12-24 ENCOUNTER — Other Ambulatory Visit: Payer: Self-pay

## 2020-12-24 ENCOUNTER — Other Ambulatory Visit: Payer: Medicaid Other | Admitting: *Deleted

## 2020-12-24 DIAGNOSIS — F4310 Post-traumatic stress disorder, unspecified: Secondary | ICD-10-CM | POA: Diagnosis not present

## 2020-12-24 DIAGNOSIS — F411 Generalized anxiety disorder: Secondary | ICD-10-CM | POA: Diagnosis not present

## 2020-12-24 NOTE — Patient Instructions (Signed)
Visit Information  [name] was given information about Medicaid Managed Care team care coordination services as a part of their [facility] Medicaid benefit. Karen Foster verbally consented to engagement with the [facility] Managed Care team.   If you are experiencing a medical emergency, please call 911 or report to your local emergency department or urgent care.   If you have a non-emergency medical problem during routine business hours, please contact your provider's office and ask to speak with a nurse.   For questions related to your [facility] health plan, please call: [phone] or go here:[email or web address]  If you would like to schedule transportation through your [facility] plan, please call the following number at least 2 days in advance of your appointment: [phone].  Call the [facility] Crisis Line at [phone], at any time, 24 hours a day, 7 days a week. If you are in danger or need immediate medical attention call 911.  If you would like help to quit smoking, call 1-800-QUIT-NOW (574-377-1506) OR Espaol: 1-855-Djelo-Ya (3-557-322-0254) o para ms informacin haga clic aqu or Text READY to 270-623 to register via text  Karen Foster - following are the goals we discussed in your visit today:   Goals Addressed             This Visit's Progress    Make and Keep All Appointments       Timeframe:  Long-Range Goal Priority:  High Start Date:  09/17/20                           Expected End Date: 01/27/21                      Follow Up Date 01/27/2021    - call to schedule annual exam with PCP, Cardiology and Pulmonologist - schedule an eye exam - arrange transportation for medical appointments 2-3 days before, with medical transportation provided by [facility] [phone] - call to cancel if needed - keep a calendar with appointment dates - learn the bus route    Why is this important?   Part of staying healthy is seeing the  doctor for follow-up care.  If you forget your appointments, there are some things you can do to stay on track.            Please see education materials related to smoking cessation provided by MyChart link.  Patient has access to MyChart and can view provided education  Telephone follow up appointment with Managed Medicaid care management team member scheduled for:01/27/21 @ 3:45pm  Karen Emms RN, BSN [place]  [facility] RN Care Coordinator   Following is a copy of your plan of care:  Patient Care Plan: General Plan of Care (Adult)     Problem Identified: Therapeutic Alliance (General Plan of Care)      Long-Range Goal: Therapeutic Alliance Established   Start Date: 09/17/2020  Expected End Date: 01/27/2021  Recent Progress: On track  Priority: High  Note:    Current Barriers:  Ineffective Self Health Maintenance-Karen Foster would like to better manage her health. She has started donating plasma and feels that this is encouraging her to take better care of herself. She has not seen her PCP since 05/2019 and does not have all of her medications once prescribed. At last plasma donation her BP 136/81 and weight 134#. She currently smokes 1/2 pack of cigarettes a day and would like to  cut back. Her housing is stable at this time and she would like to focus on her health now. She is in the process of switching insurance plans and wanted to make sure her current provider office would accept new plan. Patient is volunteering in the mornings and working on finding a job.-Update-Karen Foster now has [facility], she had a phone visit with PCP and has an office visit tomorrow. She did not attend scheduled cardiology appointment, she plans to reschedule after her PCP appointment tomorrow. She is working on smoking cessation and is down to less than 1/4 pack a day. Does not attend all scheduled provider appointments Does not adhere to prescribed medication regimen Lacks social  connections Does not maintain contact with provider office Does not contact provider office for questions/concerns Currently UNABLE TO independently self manage needs related to chronic health conditions.  Knowledge Deficits related to short term plan for care coordination needs and long term plans for chronic disease management needs Nurse Case Manager Clinical Goal(s):  patient will work with care management team to address care coordination and chronic disease management needs related to Disease Management   Interventions:  Advised patient to call today and reschedule missed Cardiology appointment Advised to discuss Ophthalmology and Pulmonology referral with PCP during visit on 12/25/20  Advised patient to make a list of questions and concerns to discuss with provider on 12/25/20 Provided encouragement for smoking cessation Provided information for Urgent [facility] [phone] to call and schedule evaluation for new dentures Discussed plans with patient for ongoing care management follow up and provided patient with direct contact information for care management team, explained that  RNCM will work closely with patient to improve health management Provided patient with MyChart educational materials related to smoking cessation Reviewed medications, instructed patient to discuss questions regarding seroquel with PCP Self Care Activities:  Patient will attend all scheduled provider appointments Patient will continue to perform ADL's independently Patient will continue to perform IADL's independently Patient will call provider office for new concerns or questions Patient Goals: - call to schedule annual exam with PCP and Pulmonologist - arrange transportation for medical appointments 2-3 days before, with medical transportation provided by [facility] [phone] - call to cancel if needed - keep a calendar with appointment dates - learn the bus route  Follow Up Plan: Telephone follow up  appointment with care management team member scheduled for:01/27/21 @ 3:45pm

## 2020-12-24 NOTE — Patient Outreach (Signed)
Medicaid Managed Care   Nurse Care Manager Note  12/24/2020 Name:  Karen Foster MRN:  [id number] DOB:  01/20/1961  Karen Foster is an 60 y.o. year old female who is a primary patient of Karen Billings, NP.  The [facility] Managed Care Coordination team was consulted for assistance with:    COPD  [name] was given information about Medicaid Managed Care Coordination team services today. Karen Foster Patient agreed to services and verbal consent obtained.  Engaged with patient by telephone for follow up visit in response to provider referral for case management and/or care coordination services.   Assessments/Interventions:  Review of past medical history, allergies, medications, health status, including review of consultants reports, laboratory and other test data, was performed as part of comprehensive evaluation and provision of chronic care management services.  SDOH (Social Determinants of Health) assessments and interventions performed: SDOH Interventions    Flowsheet Row Most Recent Value  SDOH Interventions   Social Connections Interventions Patient Refused       Care Plan  Allergies  Allergen Reactions   Ibuprofen Anaphylaxis   Bee Venom    Hydroxyzine Nausea Only   Sulfa Antibiotics Other (See Comments)   Tomato     Medications Reviewed Today     Reviewed by Karen Montane, RN (Registered Nurse) on 12/24/20 at 1632  Med List Status: <None>   Medication Order Taking? Sig Documenting Provider Last Dose Status Informant  albuterol (VENTOLIN HFA) 108 (90 Base) MCG/ACT inhaler [id number] Yes Inhale 2 puffs into the lungs every 6 (six) hours as needed for wheezing or shortness of breath. Karen Billings, NP Taking Active   benzonatate (TESSALON) 200 MG capsule [id number] Yes TAKE 1 CAPSULE BY MOUTH TWICE DAILY AS NEEDED FOR COUGH Karen Billings, NP Taking Active   budesonide-formoterol [facility]) 160-4.5 MCG/ACT inhaler [id number] No Inhale 2 puffs into the lungs  2 (two) times daily.  Patient not taking: Reported on 12/24/2020   Karen American, PA-C Not Taking Active   cyclobenzaprine (FLEXERIL) 5 MG tablet [id number] No Take 1 tablet (5 mg total) by mouth 3 (three) times daily as needed for muscle spasms.  Patient not taking: No sig reported   Karen Divine, MD Not Taking Active   diclofenac sodium (VOLTAREN) 1 % GEL [id number] No Apply 2 g topically 4 (four) times daily as needed.  Patient not taking: No sig reported   Karen American, PA-C Not Taking Active   DULoxetine (CYMBALTA) 60 MG capsule [id number] No Take 1 capsule (60 mg total) by mouth daily.  Patient not taking: No sig reported   Karen American, PA-C Not Taking Active   HYDROcodone-acetaminophen (NORCO/VICODIN) 5-325 MG tablet [id number] No Take 1 tablet by mouth every 6 (six) hours as needed for moderate pain.  Patient not taking: No sig reported   [provider] Not Taking Active   hydrocortisone (ANUSOL-HC) 25 MG suppository [id number] No Place 1 suppository (25 mg total) rectally 2 (two) times daily.  Patient not taking: No sig reported   Karen American, PA-C Not Taking Active   montelukast (SINGULAIR) 10 MG tablet [id number] No Take 1 tablet (10 mg total) by mouth at bedtime.  Patient not taking: No sig reported   Karen American, PA-C Not Taking Active   NARCAN 4 MG/0.1ML LIQD nasal spray kit [id number] No ADMINISTER A SINGLE SPRAY IN ONE NOSTRIL UPON SIGNS OF OPIOID OVERDOSE. CALL 911. REPEAT AFTER 3 MINUTES IF NO RESPONSE.  Patient not taking:  No sig reported   [provider] Not Taking Active   nicotine polacrilex (NICORETTE) 2 MG gum [id number] No Take 1 each (2 mg total) by mouth as needed for smoking cessation.  Patient not taking: No sig reported   Karen American, PA-C Not Taking Active   predniSONE (DELTASONE) 10 MG tablet [id number] No Take 6 tabs day one, 5 tabs day two, 4 tabs day three, etc  Patient not  taking: No sig reported   Karen American, PA-C Not Taking Active   pregabalin (LYRICA) 25 MG capsule [id number] No LIMIT 1 CAPSULE BY MOUTH ONCE TO TWICE DAILY IF TOLERATED  Patient not taking: No sig reported   [provider] Not Taking Active   QUEtiapine (SEROQUEL) 100 MG tablet [id number] No Take 1 tablet (100 mg total) by mouth at bedtime.  Patient not taking: Reported on 12/24/2020   Karen Billings, NP Not Taking Active   QUEtiapine (SEROQUEL) 25 MG tablet [id number] Yes Take 1-2 tablets every morning as needed. Continue taking 50 mg tablet nightly in addition Karen Billings, NP Taking Active            Med Note (Karen Foster A   Tue Dec 24, 2020  4:30 PM) Taking 29m in the morning and at bedtime.  tiotropium (SPIRIVA HANDIHALER) 18 MCG inhalation capsule [id number] Place 1 capsule (18 mcg total) into inhaler and inhale daily.  Patient not taking: No sig reported   LVolney Foster [place] Taking Active   triamcinolone cream (KENALOG) 0.1 % [id number] APPLY TOPICALLY TWICE DAILY  Patient not taking: No sig reported   LVolney Foster PV[place]ontNot Taking Active             Patient Active Problem List   Diagnosis Date Noted   Allergic rhinitis 05/26/2019   Urticaria 12/01/2018   COPD [place] type [place] 12/01/2018   Cigarette smoker 06/08/2018   Arthritis 02/19/2018   Hemorrhoids 02/18/2018   Reactive airway disease 02/18/2018   Migraines 12/12/2017   Deviated septum 12/03/2017   Heart murmur 12/03/2017   Depression 12/03/2017   Panic disorder     Conditions to be addressed/monitored per PCP order:  COPD  Care Plan : General Plan of Care (Adult)  Updates made by RMelissa Montane RN since 12/24/2020 12:00 AM     Problem: Therapeutic Alliance (General Plan of Care)      Long-Range Goal: Therapeutic Alliance Established   Start Date: 09/17/2020  Expected End Date: 01/27/2021  Recent Progress: On track  Priority: High  Note:     Current Barriers:  Ineffective Self Health Maintenance-[name] would like to better manage her health. She has started donating plasma and feels that this is encouraging her to take better care of herself. She has not seen her PCP since 05/2019 and does not have all of her medications once prescribed. At last plasma donation her BP 136/81 and weight 134#. She currently smokes 1/2 pack of cigarettes a day and would like to cut back. Her housing is stable at this time and she would like to focus on her health now. She is in the process of switching insurance plans and wanted to make sure her current provider office would accept new plan. Patient is volunteering in the mornings and working on finding a job.-Update-[facility] with [facility]CP and has an office visit tomorrow. She did not attend scheduled cardiology appointment, she plans to reschedule after  her PCP appointment tomorrow. She is working on smoking cessation and is down to less than 1/4 pack a day. Does not attend all scheduled provider appointments Does not adhere to prescribed medication regimen Lacks social connections Does not maintain contact with provider office Does not contact provider office for questions/concerns Currently UNABLE TO independently self manage needs related to chronic health conditions.  Knowledge Deficits related to short term plan for care coordination needs and long term plans for chronic disease management needs Nurse Case Manager Clinical Goal(s):  patient will work with care management team to address care coordination and chronic disease management needs related to Disease Management   Interventions:  Advised patient to call today and reschedule missed Cardiology appointment Advised to discuss Ophthalmology and Pulmonology referral with PCP during visit on 12/25/20  Advised patient to make a list of questions and concerns to discuss with provider on 12/25/20 Provided  encouragement for smoking cessation Provided information for [facility] [phone] to call and schedule evaluation for new dentures Discussed plans with patient for ongoing care management follow up and provided patient with direct contact information for care management team, explained that  RNCM will work closely with patient to improve health management Provided patient with MyChart educational materials related to smoking cessation Reviewed medications, instructed patient to discuss questions regarding seroquel with PCP Self Care Activities:  Patient will attend all scheduled provider appointments Patient will continue to perform ADL's independently Patient will continue to perform IADL's independently Patient will call provider office for new concerns or questions Patient Goals: - call to schedule annual exam with PCP and Pulmonologist - arrange transportation for medical appointments 2-3 days before, with medical transportation provided by [facility] [phone] - call to cancel if needed - keep a calendar with appointment dates - learn the bus route  Follow Up Plan: Telephone follow up appointment with care management team member scheduled for:01/27/21 @ 3:45pm      Follow Up:  Patient agrees to Care Plan and Follow-up.  Plan: The Managed Medicaid care management team will reach out to the patient again over the next 30 days.  Date/time of next scheduled RN care management/care coordination outreach:  01/27/21 @ 3:45pm  Lurena Joiner RN, BSN [place]  [employer] manager

## 2020-12-25 ENCOUNTER — Other Ambulatory Visit: Payer: Self-pay

## 2020-12-25 ENCOUNTER — Encounter: Payer: Self-pay | Admitting: Nurse Practitioner

## 2020-12-25 ENCOUNTER — Ambulatory Visit (INDEPENDENT_AMBULATORY_CARE_PROVIDER_SITE_OTHER): Payer: Medicaid Other | Admitting: Nurse Practitioner

## 2020-12-25 VITALS — BP 122/69 | HR 127 | Ht 65.0 in | Wt 135.2 lb

## 2020-12-25 DIAGNOSIS — Z114 Encounter for screening for human immunodeficiency virus [HIV]: Secondary | ICD-10-CM | POA: Diagnosis not present

## 2020-12-25 DIAGNOSIS — F332 Major depressive disorder, recurrent severe without psychotic features: Secondary | ICD-10-CM

## 2020-12-25 DIAGNOSIS — G8929 Other chronic pain: Secondary | ICD-10-CM

## 2020-12-25 DIAGNOSIS — J4540 Moderate persistent asthma, uncomplicated: Secondary | ICD-10-CM | POA: Diagnosis not present

## 2020-12-25 DIAGNOSIS — J449 Chronic obstructive pulmonary disease, unspecified: Secondary | ICD-10-CM

## 2020-12-25 DIAGNOSIS — Z1159 Encounter for screening for other viral diseases: Secondary | ICD-10-CM | POA: Diagnosis not present

## 2020-12-25 DIAGNOSIS — F410 Panic disorder [episodic paroxysmal anxiety] without agoraphobia: Secondary | ICD-10-CM

## 2020-12-25 MED ORDER — MONTELUKAST SODIUM 10 MG PO TABS: 10.0000 mg | ORAL_TABLET | Freq: Every day | ORAL | 1 refills | Status: DC

## 2020-12-25 MED ORDER — GABAPENTIN 300 MG PO CAPS: 300.0000 mg | ORAL_CAPSULE | Freq: Four times a day (QID) | ORAL | 0 refills | Status: DC

## 2020-12-25 MED ORDER — DULOXETINE HCL 20 MG PO CPEP: 20.0000 mg | ORAL_CAPSULE | Freq: Every day | ORAL | 0 refills | Status: DC

## 2020-12-25 MED ORDER — OMEPRAZOLE 20 MG PO CPDR: 20.0000 mg | DELAYED_RELEASE_CAPSULE | Freq: Every day | ORAL | 0 refills | Status: DC

## 2020-12-25 MED ORDER — BENZONATATE 200 MG PO CAPS
ORAL_CAPSULE | ORAL | 0 refills | Status: DC
Start: 2020-12-25 — End: 2021-01-29

## 2020-12-25 MED ORDER — CYCLOBENZAPRINE HCL 5 MG PO TABS: 5.0000 mg | ORAL_TABLET | Freq: Three times a day (TID) | ORAL | 0 refills | Status: DC | PRN

## 2020-12-25 MED ORDER — QUETIAPINE FUMARATE 25 MG PO TABS: ORAL_TABLET | ORAL | 0 refills | Status: DC

## 2020-12-25 MED ORDER — BUDESONIDE-FORMOTEROL FUMARATE 160-4.5 MCG/ACT IN AERO: 2.0000 | INHALATION_SPRAY | Freq: Two times a day (BID) | RESPIRATORY_TRACT | 3 refills | Status: DC

## 2020-12-25 NOTE — Progress Notes (Signed)
BP 122/69   Pulse (!) 127   Ht 5' 5"  (1.651 m)   Wt 135 lb 3.2 oz (61.3 kg)   SpO2 98%   BMI 22.50 kg/m    Subjective:    Patient ID: Karen Foster, female    DOB: [birth year], 60 y.o.   MRN: [id number]  HPI: Karen Foster is a 60 y.o. female  Chief Complaint  Patient presents with   COPD   COPD COPD status: uncontrolled Satisfied with current treatment?: yes Oxygen use: no Dyspnea frequency:  Cough frequency:  Rescue inhaler frequency:   Limitation of activity: no Productive cough:  Last Spirometry:  Pneumovax: Not up to Date Influenza: Up to Date  ANXIETY/DEPRESSION Patient states she is taking Seroquel 57m BID.  She did not restart the 1077mdue to being nervous to start that much at one time.  Feels like that is improving her mood.  Would like to increase to 2-3 tabs at bedtime.   Fl[place]zelffice [place] 12[place]01/[place] Total Score 2      GAD 7 : Generalized Anxiety Score 12/01/2018 07/13/2018 06/08/2018 05/10/2018  Nervous, Anxious, on Edge 0 1 3 3   Control/stop worrying 0 0 0 3  Worry too much - different things 0 0 0 3  Trouble relaxing 1 0 0 3  Restless 0 0 0 3  Easily annoyed or irritable 0 0 0 3  Afraid - awful might happen 0 0 1 3  Total GAD 7 Score 1 1 4 21   Anxiety Difficulty Not difficult at all Not difficult at all Not difficult at all Very difficult     TACHYCARDIA Patient was supposed to have a cardiology appointment on 10/24.  Patient states her pulse is running around the 80s.  Denies HA, CP, dizziness, palpitations, visual changes, and lower extremity swelling.   Relevant past medical, surgical, family and social history reviewed and updated as indicated. Interim medical history since our last visit reviewed. Allergies and medications reviewed and updated.  Review of Systems  Eyes:  Negative for visual disturbance.  Respiratory:  Positive for shortness of breath. Negative for cough and chest tightness.    Cardiovascular:  Negative for chest pain, palpitations and leg swelling.  Neurological:  Negative for dizziness and headaches.  Psychiatric/Behavioral:  The patient is nervous/anxious.    Per HPI unless specifically indicated above     Objective:    BP 122/69   Pulse (!) 127   Ht 5' 5"  (1.651 m)   Wt 135 lb 3.2 oz (61.3 kg)   SpO2 98%   BMI 22.50 kg/m   Wt Readings from Last 3 Encounters:  12/25/20 135 lb 3.2 oz (61.3 kg)  04/10/20 105 lb (47.6 kg)  12/01/18 123 lb (55.8 kg)    Physical Exam Vitals and nursing note reviewed.  Constitutional:      General: She is not in acute distress.    Appearance: Normal appearance. She is normal weight. She is not ill-appearing, toxic-appearing or diaphoretic.  HENT:     Head: Normocephalic.     Right Ear: External ear normal.     Left Ear: External ear normal.     Nose: Nose normal.     Mouth/Throat:     Mouth: Mucous membranes are moist.     Pharynx: Oropharynx is clear.  Eyes:     General:        Right eye: No discharge.  Left eye: No discharge.     Extraocular Movements: Extraocular movements intact.     Conjunctiva/sclera: Conjunctivae normal.     Pupils: Pupils are equal, round, and reactive to light.  Cardiovascular:     Rate and Rhythm: Normal rate and regular rhythm.     Heart sounds: Murmur heard.  Pulmonary:     Effort: Pulmonary effort is normal. No respiratory distress.     Breath sounds: Normal breath sounds. Decreased air movement present. No wheezing or rales.  Musculoskeletal:     Cervical back: Normal range of motion and neck supple.  Skin:    General: Skin is warm and dry.     Capillary Refill: Capillary refill takes less than 2 seconds.  Neurological:     General: No focal deficit present.     Mental Status: She is alert and oriented to person, place, and time. Mental status is at baseline.  Psychiatric:        Mood and Affect: Mood normal.        Behavior: Behavior normal.        Thought  Content: Thought content normal.        Judgment: Judgment normal.    Results for orders placed or performed in visit on 02/18/18  HgB A1c  Result Value Ref Range   Hgb A1c MFr Bld 5.3 4.8 - 5.6 %   Est. average glucose Bld gHb Est-mCnc 105 mg/dL  Basic Metabolic Panel (BMET)  Result Value Ref Range   Glucose 90 65 - 99 mg/dL   BUN 20 6 - 24 mg/dL   Creatinine, Ser 0.81 0.57 - 1.00 mg/dL   GFR calc non Af Amer 81 >59 mL/min/1.73   GFR calc Af Amer 94 >59 mL/min/1.73   BUN/Creatinine Ratio 25 (H) 9 - 23   Sodium 140 134 - 144 mmol/L   Potassium 4.0 3.5 - 5.2 mmol/L   Chloride 104 96 - 106 mmol/L   CO2 22 20 - 29 mmol/L   Calcium 9.0 8.7 - 10.2 mg/dL  UA/M w/rflx Culture, Routine   Specimen: Urine   URINE  Result Value Ref Range   Specific Gravity, UA 1.025 1.005 - 1.030   pH, UA 5.5 5.0 - 7.5   Color, UA Yellow Yellow   Appearance Ur Clear Clear   Leukocytes, UA Negative Negative   Protein, UA Negative Negative/Trace   Glucose, UA Negative Negative   Ketones, UA Negative Negative   RBC, UA Negative Negative   Bilirubin, UA Negative Negative   Urobilinogen, Ur 0.2 0.2 - 1.0 mg/dL   Nitrite, UA Negative Negative      Assessment & Plan:   Problem List Items Addressed This Visit       Respiratory   Reactive airway disease    Controlled with Tessalon Prn for cough.      Relevant Medications   benzonatate (TESSALON) 200 MG capsule   COPD mixed type ([place]) - Primary    Chronic.  Controlled.  Continue with current medication regimen with Symbicort and Singulair.  Labs ordered today.  Return to clinic in 1 months for reevaluation.  Call sooner if concerns arise.        Relevant Medications   benzonatate (TESSALON) 200 MG capsule   budesonide-formoterol (SYMBICORT) 160-4.5 MCG/ACT inhaler   montelukast (SINGULAIR) 10 MG tablet   Other Relevant Orders   Comp Met (CMET)   Lipid Profile     Other   Panic disorder    Chronic. Continue with Seroquel.  Can take 2 tabs  at bedtime.  If tolerating well, can increase to 100m at bedtime. Follow up in 1 month for reevaluation.      Relevant Medications   DULoxetine (CYMBALTA) 20 MG capsule   Depression    Chronic. Continue with Seroquel.  Can take 2 tabs at bedtime.  If tolerating well, can increase to 750mat bedtime.  Will also restart Duloxetine 205maily to help with pain and depression. Side effects and benefits discussed with patient during visit.  Can increase if needed at next visit.  Follow up in 1 month for reevaluation.      Relevant Medications   DULoxetine (CYMBALTA) 20 MG capsule   Other chronic pain    Chronic. Patient was previously seeing pain management. However, she has not seen in them in over a year.  She does not want to restart the Norco.  Would like to restart flexeril and Gabapentin.  Discussed with patient during visit today that those medications can be managed here however, she will need to have maintain her follow ups in order to continue to receive them.  Patient understands and agrees with the plan of care.       Relevant Medications   cyclobenzaprine (FLEXERIL) 5 MG tablet   gabapentin (NEURONTIN) 300 MG capsule   DULoxetine (CYMBALTA) 20 MG capsule   Other Visit Diagnoses     Encounter for hepatitis C screening test for low risk patient       Relevant Orders   Hepatitis C Antibody   Screening for HIV (human immunodeficiency virus)       Relevant Orders   HIV Antibody (routine testing w rflx)        Follow up plan: Return in about 1 month (around 01/25/2021) for Physical and Fasting labs, Depression/Anxiety FU.

## 2020-12-25 NOTE — Assessment & Plan Note (Signed)
Chronic. Continue with Seroquel.  Can take 2 tabs at bedtime.  If tolerating well, can increase to 75mg  at bedtime. Follow up in 1 month for reevaluation.

## 2020-12-25 NOTE — Assessment & Plan Note (Signed)
Controlled with Tessalon Prn for cough.

## 2020-12-25 NOTE — Assessment & Plan Note (Signed)
Chronic.  Controlled.  Continue with current medication regimen with Symbicort and Singulair.  Labs ordered today.  Return to clinic in 1 months for reevaluation.  Call sooner if concerns arise.

## 2020-12-25 NOTE — Assessment & Plan Note (Signed)
Chronic. Patient was previously seeing pain management. However, she has not seen in them in over a year.  She does not want to restart the Norco.  Would like to restart flexeril and Gabapentin.  Discussed with patient during visit today that those medications can be managed here however, she will need to have maintain her follow ups in order to continue to receive them.  Patient understands and agrees with the plan of care.

## 2020-12-25 NOTE — Addendum Note (Signed)
Addended by: Larae Grooms on: 12/25/2020 03:55 PM   Modules accepted: Orders

## 2020-12-25 NOTE — Assessment & Plan Note (Signed)
Chronic. Continue with Seroquel.  Can take 2 tabs at bedtime.  If tolerating well, can increase to 75mg  at bedtime.  Will also restart Duloxetine 20mg  daily to help with pain and depression. Side effects and benefits discussed with patient during visit.  Can increase if needed at next visit.  Follow up in 1 month for reevaluation.

## 2020-12-26 ENCOUNTER — Telehealth: Payer: Self-pay | Admitting: Nurse Practitioner

## 2020-12-26 ENCOUNTER — Telehealth: Payer: Self-pay

## 2020-12-26 LAB — COMPREHENSIVE METABOLIC PANEL
ALT: 12 IU/L (ref 0–32)
AST: 17 IU/L (ref 0–40)
Albumin/Globulin Ratio: 1.9 (ref 1.2–2.2)
Albumin: 4 g/dL (ref 3.8–4.9)
Alkaline Phosphatase: 86 IU/L (ref 44–121)
BUN/Creatinine Ratio: 15 (ref 9–23)
BUN: 11 mg/dL (ref 6–24)
Bilirubin Total: <0.2 mg/dL (ref 0.0–1.2)
CO2: 20 mmol/L (ref 20–29)
Calcium: 8.9 mg/dL (ref 8.7–10.2)
Chloride: 104 mmol/L (ref 96–106)
Creatinine, Ser: 0.74 mg/dL (ref 0.57–1.00)
Globulin, Total: 2.1 g/dL (ref 1.5–4.5)
Glucose: 108 mg/dL — ABNORMAL HIGH (ref 70–99)
Potassium: 4 mmol/L (ref 3.5–5.2)
Sodium: 142 mmol/L (ref 134–144)
Total Protein: 6.1 g/dL (ref 6.0–8.5)
eGFR: 93 mL/min/{1.73_m2} (ref >59)

## 2020-12-26 LAB — HIV ANTIBODY (ROUTINE TESTING W REFLEX): HIV Screen 4th Generation wRfx: NONREACTIVE

## 2020-12-26 LAB — LIPID PANEL
Chol/HDL Ratio: 7.2 ratio — ABNORMAL HIGH (ref 0.0–4.4)
Cholesterol, Total: 237 mg/dL — ABNORMAL HIGH (ref 100–199)
HDL: 33 mg/dL — ABNORMAL LOW (ref >39)
LDL Chol Calc (NIH): 160 mg/dL — ABNORMAL HIGH (ref 0–99)
Triglycerides: 235 mg/dL — ABNORMAL HIGH (ref 0–149)
VLDL Cholesterol Cal: 44 mg/dL — ABNORMAL HIGH (ref 5–40)

## 2020-12-26 LAB — HEPATITIS C ANTIBODY: Hep C Virus Ab: <0.1 s/co ratio (ref 0.0–0.9)

## 2020-12-26 NOTE — Telephone Encounter (Signed)
Copied from CRM [id number]. Topic: Quick Communication - Rx Refill/Question >> Dec 26, 2020 11:45 AM Pawlus, Maxine Glenn A wrote: Pt stated she is agreeable to start taking Crestor 5mg  once daily. [id number] [place] ([place]),  - [address]

## 2020-12-26 NOTE — Progress Notes (Signed)
Please let patient know that her lab work shows that her liver, kidneys and electrolytes look good.  She is negative for hepatitis C and HIV.  Her cholesterol is elevated.  I recommend she begin Crestor 5mg  once daily.  We will likely increase this at her next visit to 10mg  if she tolerates it well.  If patient agrees, I will send this to the pharmacy for her. Follow up as discussed.

## 2020-12-26 NOTE — Telephone Encounter (Signed)
Copied from CRM [id number]. Topic: General - Other >> Dec 26, 2020  9:56 AM Traci Sermon wrote: Reason for CRM: Pt called in stating her insurance needed a pre authorization for medication QUEtiapine (SEROQUEL) 25 MG tablet

## 2020-12-27 ENCOUNTER — Telehealth: Payer: Self-pay

## 2020-12-27 MED ORDER — ROSUVASTATIN CALCIUM 5 MG PO TABS: 5.0000 mg | ORAL_TABLET | Freq: Every day | ORAL | 1 refills | Status: DC

## 2020-12-27 NOTE — Telephone Encounter (Signed)
PA for Seroquel initiated and submitted via Cover My Meds. Key: [employer]

## 2020-12-27 NOTE — Telephone Encounter (Signed)
Pt called back in to get an update on the authorization, please advise.

## 2020-12-27 NOTE — Telephone Encounter (Signed)
See patient's chart as she has been communicating with provider Larae Grooms, NP regarding the patient medication request.

## 2020-12-27 NOTE — Telephone Encounter (Signed)
Medication sent to the pharmacy.

## 2020-12-28 DIAGNOSIS — F4310 Post-traumatic stress disorder, unspecified: Secondary | ICD-10-CM | POA: Diagnosis not present

## 2020-12-28 DIAGNOSIS — F411 Generalized anxiety disorder: Secondary | ICD-10-CM | POA: Diagnosis not present

## 2020-12-29 DIAGNOSIS — F4310 Post-traumatic stress disorder, unspecified: Secondary | ICD-10-CM | POA: Diagnosis not present

## 2020-12-29 DIAGNOSIS — F411 Generalized anxiety disorder: Secondary | ICD-10-CM | POA: Diagnosis not present

## 2020-12-31 DIAGNOSIS — Z419 Encounter for procedure for purposes other than remedying health state, unspecified: Secondary | ICD-10-CM | POA: Diagnosis not present

## 2021-01-02 NOTE — Congregational Nurse Program (Signed)
  Dept: [phone]   Congregational Nurse Program Note  Date of Encounter: 01/02/2021 Client to clinic for vital sign check and support. Discussed recent changes in medications, reviewed recent labs and provided education regarding cholesterol. BP 130/85, 122, 99% on room air. Client to return next week for continued monitoring and support.  Past Medical History: Past Medical History:  Diagnosis Date   Allergy    Anxiety    Arthritis    Depression    GERD (gastroesophageal reflux disease)    Glaucoma    Heart murmur    Kidney stones    Migraines    Panic attack    Panic attack     Encounter Details:  CNP Questionnaire - 01/02/21 1331       Questionnaire   Do you give verbal consent to treat you today? Yes    Location Patient Served  [facility]    Visit Setting Church or Organization    Patient Status Unknown    Insurance Medicaid   well care   Insurance Referral N/A    Medication N/A   no copay for meds with her current insurance   Medical Provider Yes    Screening Referrals N/A   discussed need for [facility]   Medical Referral N/A    Medical Appointment Made N/A    Food N/A    Transportation N/A   has friends that provide trnasportation   Housing/Utilities No permanent housing   currently in a room in [place], unclear as to how long she can stay   Interpersonal Safety N/A    Intervention Blood pressure;Educate;Support    ED Visit Averted N/A    Life-Saving Intervention Made N/A

## 2021-01-02 NOTE — Progress Notes (Signed)
Yes she should be taking it at night.

## 2021-01-04 DIAGNOSIS — F4310 Post-traumatic stress disorder, unspecified: Secondary | ICD-10-CM | POA: Diagnosis not present

## 2021-01-04 DIAGNOSIS — F411 Generalized anxiety disorder: Secondary | ICD-10-CM | POA: Diagnosis not present

## 2021-01-05 DIAGNOSIS — F4310 Post-traumatic stress disorder, unspecified: Secondary | ICD-10-CM | POA: Diagnosis not present

## 2021-01-05 DIAGNOSIS — F411 Generalized anxiety disorder: Secondary | ICD-10-CM | POA: Diagnosis not present

## 2021-01-06 NOTE — Telephone Encounter (Signed)
PA approved.

## 2021-01-11 DIAGNOSIS — F411 Generalized anxiety disorder: Secondary | ICD-10-CM | POA: Diagnosis not present

## 2021-01-11 DIAGNOSIS — F4310 Post-traumatic stress disorder, unspecified: Secondary | ICD-10-CM | POA: Diagnosis not present

## 2021-01-12 DIAGNOSIS — F4310 Post-traumatic stress disorder, unspecified: Secondary | ICD-10-CM | POA: Diagnosis not present

## 2021-01-12 DIAGNOSIS — F411 Generalized anxiety disorder: Secondary | ICD-10-CM | POA: Diagnosis not present

## 2021-01-18 DIAGNOSIS — F4310 Post-traumatic stress disorder, unspecified: Secondary | ICD-10-CM | POA: Diagnosis not present

## 2021-01-18 DIAGNOSIS — F411 Generalized anxiety disorder: Secondary | ICD-10-CM | POA: Diagnosis not present

## 2021-01-19 DIAGNOSIS — F4310 Post-traumatic stress disorder, unspecified: Secondary | ICD-10-CM | POA: Diagnosis not present

## 2021-01-19 DIAGNOSIS — F411 Generalized anxiety disorder: Secondary | ICD-10-CM | POA: Diagnosis not present

## 2021-01-25 DIAGNOSIS — F411 Generalized anxiety disorder: Secondary | ICD-10-CM | POA: Diagnosis not present

## 2021-01-25 DIAGNOSIS — F4310 Post-traumatic stress disorder, unspecified: Secondary | ICD-10-CM | POA: Diagnosis not present

## 2021-01-26 DIAGNOSIS — F4310 Post-traumatic stress disorder, unspecified: Secondary | ICD-10-CM | POA: Diagnosis not present

## 2021-01-26 DIAGNOSIS — F411 Generalized anxiety disorder: Secondary | ICD-10-CM | POA: Diagnosis not present

## 2021-01-27 ENCOUNTER — Other Ambulatory Visit: Payer: Self-pay | Admitting: *Deleted

## 2021-01-27 ENCOUNTER — Other Ambulatory Visit: Payer: Self-pay | Admitting: Nurse Practitioner

## 2021-01-27 ENCOUNTER — Other Ambulatory Visit: Payer: Self-pay

## 2021-01-27 DIAGNOSIS — J4540 Moderate persistent asthma, uncomplicated: Secondary | ICD-10-CM

## 2021-01-27 NOTE — Telephone Encounter (Signed)
Medication Refill - Medication: Gabapentin 300 mg,  4 times daily//, Seroguel 25 mg and Flexelril 5 mg  Has the patient contacted their pharmacy? No.  She said the pharmacy messed it up last time. (Agent: If no, request that the patient contact the pharmacy for the refill. If patient does not wish to contact the pharmacy document the reason why and proceed with request.) (Agent: If yes, when and what did the pharmacy advise?)  Preferred Pharmacy (with phone number or street name): [facility] [address] Has the patient been seen for an appointment in the last year OR does the patient have an upcoming appointment? Yes.    Agent: Please be advised that RX refills may take up to 3 business days. We ask that you follow-up with your pharmacy.

## 2021-01-27 NOTE — Patient Instructions (Signed)
Visit Information  Karen Foster was given information about Medicaid Managed Care team care coordination services as a part of their [facility] Medicaid benefit. Karen Foster verbally consented to engagement with the [facility] Managed Care team.   If you are experiencing a medical emergency, please call 911 or report to your local emergency department or urgent care.   If you have a non-emergency medical problem during routine business hours, please contact your provider's office and ask to speak with a nurse.   For questions related to your [facility] health plan, please call: [phone] or go here:[email or web address]  If you would like to schedule transportation through your [facility] plan, please call the following number at least 2 days in advance of your appointment: [phone].  Call the [facility] Crisis Line at [phone], at any time, 24 hours a day, 7 days a week. If you are in danger or need immediate medical attention call 911.  If you would like help to quit smoking, call 1-800-QUIT-NOW ((260) 419-9878) OR Espaol: 1-855-Djelo-Ya (1-540-086-7619) o para ms informacin haga clic aqu or Text READY to 509-326 to register via text  Karen Foster - following are the goals we discussed in your visit today:   Goals Addressed             This Visit's Progress    COMPLETED: Make and Keep All Appointments       Resolving due to duplicate goal  Timeframe:  Long-Range Goal Priority:  High Start Date:  09/17/20                           Expected End Date: 01/27/21                      Follow Up Date 01/27/2021    - call to schedule annual exam with PCP, Cardiology and Pulmonologist - schedule an eye exam - arrange transportation for medical appointments 2-3 days before, with medical transportation provided by [facility] [phone] - call to cancel if needed - keep a calendar with appointment dates - learn the bus route    Why is this important?    Part of staying healthy is seeing the doctor for follow-up care.  If you forget your appointments, there are some things you can do to stay on track.            Please see education materials related to high cholesterol provided by MyChart link. and as Financial risk analyst.   The patient verbalized understanding of instructions provided today and agreed to receive a mailed copy of patient instruction and/or educational materials.  Telephone follow up appointment with Managed Medicaid care management team member scheduled for:02/26/21 @ 3:45pm  Karen Emms RN, BSN [place]  [facility] RN Care Coordinator   Following is a copy of your plan of care:  Care Plan : General Plan of Care (Adult)  Updates made by Heidi Dach, RN since 01/27/2021 12:00 AM  Completed 01/27/2021   Problem: Therapeutic Alliance (General Plan of Care) Resolved 01/27/2021  Note:   Resolving due to duplicate goal     Long-Range Goal: Therapeutic Alliance Established Completed 01/27/2021  Start Date: 09/17/2020  Expected End Date: 01/27/2021  Recent Progress: On track  Priority: High  Note:    Current Barriers:  Ineffective Self Health Maintenance-Karen Foster would like to better manage her health. She has started donating plasma and feels that this is encouraging her to  take better care of herself. She has not seen her PCP since 05/2019 and does not have all of her medications once prescribed. At last plasma donation her BP 136/81 and weight 134#. She currently smokes 1/2 pack of cigarettes a day and would like to cut back. Her housing is stable at this time and she would like to focus on her health now. She is in the process of switching insurance plans and wanted to make sure her current provider office would accept new plan. Patient is volunteering in the mornings and working on finding a job.-Update-Karen Foster now has [name], she had a phone visit with PCP and has an office visit tomorrow.  She did not attend scheduled cardiology appointment, she plans to reschedule after her PCP appointment tomorrow. She is working on smoking cessation and is down to less than 1/4 pack a day. Does not attend all scheduled provider appointments Does not adhere to prescribed medication regimen Lacks social connections Does not maintain contact with provider office Does not contact provider office for questions/concerns Currently UNABLE TO independently self manage needs related to chronic health conditions.  Knowledge Deficits related to short term plan for care coordination needs and long term plans for chronic disease management needs Resolving due to duplicate goal Nurse Case Manager Clinical Goal(s):  patient will work with care management team to address care coordination and chronic disease management needs related to Disease Management   Interventions:  Advised patient to call today and reschedule missed Cardiology appointment Advised to discuss Ophthalmology and Pulmonology referral with PCP during visit on 12/25/20  Advised patient to make a list of questions and concerns to discuss with provider on 12/25/20 Provided encouragement for smoking cessation Provided information for Urgent Tooth [phone] to call and schedule evaluation for new dentures Discussed plans with patient for ongoing care management follow up and provided patient with direct contact information for care management team, explained that  RNCM will work closely with patient to improve health management Provided patient with MyChart educational materials related to smoking cessation Reviewed medications, instructed patient to discuss questions regarding seroquel with PCP Self Care Activities:  Patient will attend all scheduled provider appointments Patient will continue to perform ADL's independently Patient will continue to perform IADL's independently Patient will call provider office for new concerns or  questions Patient Goals: - call to schedule annual exam with PCP and Pulmonologist - arrange transportation for medical appointments 2-3 days before, with medical transportation provided by [facility] [phone] - call to cancel if needed - keep a calendar with appointment dates - learn the bus route  Follow Up Plan: Telephone follow up appointment with care management team member scheduled for:01/27/21 @ 3:45pm     Care Plan : RN Care Manager Plan of Care  Updates made by Heidi Dach, RN since 01/27/2021 12:00 AM     Problem: CHL AMB "PATIENT-SPECIFIC PROBLEM"      Long-Range Goal: Patient-Specific Goal   Start Date: 01/27/2021  Expected End Date: 04/27/2021  Priority: High  Note:   Current Barriers:  Chronic Disease Management support and education needs related to HLD  RNCM Clinical Goal(s):  Patient will verbalize understanding of plan for management of HLD and COPD as evidenced by patient verbalization and self monitoring activity take all medications exactly as prescribed and will call provider for medication related questions as evidenced by documentation in EMR    attend all scheduled medical appointments: 01/29/21 with PCP as evidenced by provider documentation in EMR  continue to work with Medical illustrator and/or Social Worker to address care management and care coordination needs related to HLD and COPD as evidenced by adherence to CM Team Scheduled appointments     through collaboration with Medical illustrator, provider, and care team.   Interventions: Inter-disciplinary care team collaboration (see longitudinal plan of care) Evaluation of current treatment plan related to  self management and patient's adherence to plan as established by provider   Hyperlipidemia:  (Status: New goal.) Long Term Goal  Lab Results  Component Value Date   CHOL 237 (H) 12/25/2020   HDL 33 (L) 12/25/2020   LDLCALC 160 (H) 12/25/2020   TRIG 235 (H) 12/25/2020   CHOLHDL 7.2  (H) 12/25/2020     Medication review performed; medication list updated in electronic medical record.  Provider established cholesterol goals reviewed; Counseled on importance of regular laboratory monitoring as prescribed; Provided HLD educational materials; Reviewed role and benefits of statin for ASCVD risk reduction; Reviewed importance of limiting foods high in cholesterol; Reviewed exercise goals and target of 150 minutes per week; Assessed social determinant of health barriers;   Patient Goals/Self-Care Activities: Take medications as prescribed   Attend all scheduled provider appointments Call pharmacy for medication refills 3-7 days in advance of running out of medications Attend church or other social activities Perform all self care activities independently  Call provider office for new concerns or questions  - adhere to prescribed diet: heart healthy - develop an exercise routine

## 2021-01-27 NOTE — Patient Outreach (Signed)
Medicaid Managed Care   Nurse Care Manager Note  01/27/2021 Name:  Karen Foster MRN:  [id number] DOB:  [birth year]  Karen Foster is an 60 y.o. year old female who is Foster primary patient of Karen Billings, NP.  The [facility] Managed Care Coordination team was consulted for assistance with:    HLD  Ms. Meskill was given information about Medicaid Managed Care Coordination team services today. Karen Foster Patient agreed to services and verbal consent obtained.  Engaged with patient by telephone for follow up visit in response to provider referral for case management and/or care coordination services.   Assessments/Interventions:  Review of past medical history, allergies, medications, health status, including review of consultants reports, laboratory and other test data, was performed as part of comprehensive evaluation and provision of chronic care management services.  SDOH (Social Determinants of Health) assessments and interventions performed:   Care Plan  Allergies  Allergen Reactions   Ibuprofen Anaphylaxis   Bee Venom    Hydroxyzine Nausea Only   Sulfa Antibiotics Other (See Comments)   Tomato     Medications Reviewed Today     Reviewed by Karen Billings, NP (Nurse Practitioner) on 12/25/20 at 1406  Med List Status: <None>   Medication Order Taking? Sig Documenting Provider Last Dose Status Informant  albuterol (VENTOLIN HFA) 108 (90 Base) MCG/ACT inhaler [id number]  Inhale 2 puffs into the lungs every 6 (six) hours as needed for wheezing or shortness of breath. Karen Billings, NP  Active   benzonatate (TESSALON) 200 MG capsule [id number]  TAKE 1 CAPSULE BY MOUTH TWICE DAILY AS NEEDED FOR COUGH Karen Billings, NP  Active   budesonide-formoterol [facility]) 160-4.5 MCG/ACT inhaler [id number] No Inhale 2 puffs into the lungs 2 (two) times daily.  Patient not taking: No sig reported   [name], [place] Not Taking Active   cyclobenzaprine (FLEXERIL) 5 MG tablet  [id number] No Take 1 tablet (5 mg total) by mouth 3 (three) times daily as needed for muscle spasms.  Patient not taking: No sig reported   [name], MD Not Taking Active   diclofenac sodium (VOLTAREN) 1 % GEL [id number] No Apply 2 g topically 4 (four) times daily as needed.  Patient not taking: No sig reported   [name], PA-C Not Taking Active   DULoxetine (CYMBALTA) 60 MG capsule [id number] No Take 1 capsule (60 mg total) by mouth daily.  Patient not taking: No sig reported   [name], PA-C Not Taking Active   HYDROcodone-acetaminophen (NORCO/VICODIN) 5-325 MG tablet [id number] No Take 1 tablet by mouth every 6 (six) hours as needed for moderate pain.  Patient not taking: No sig reported   [provider] Not Taking Active   hydrocortisone (ANUSOL-HC) 25 MG suppository [id number] No Place 1 suppository (25 mg total) rectally 2 (two) times daily.  Patient not taking: No sig reported   [name], PA-C Not Taking Active   montelukast (SINGULAIR) 10 MG tablet [id number]  Take 1 tablet (10 mg total) by mouth at bedtime.  Patient not taking: No sig reported   [name], PA-C  Active   NARCAN 4 MG/0.1ML LIQD nasal spray kit [id number]  ADMINISTER Foster SINGLE SPRAY IN ONE NOSTRIL UPON SIGNS OF OPIOID OVERDOSE. CALL 911. REPEAT AFTER 3 MINUTES IF NO RESPONSE.  Patient not taking: No sig reported   [provider]  Active   nicotine polacrilex (NICORETTE) 2 MG gum [id number]  Take 1 each (2 mg total)  by mouth as needed for smoking cessation.  Patient not taking: No sig reported   [name], PA-C  Active   predniSONE (DELTASONE) 10 MG tablet [id number]  Take 6 tabs day one, 5 tabs day two, 4 tabs day three, etc  Patient not taking: No sig reported   [name], PA-C  Active   pregabalin (LYRICA) 25 MG capsule [id number]  LIMIT 1 CAPSULE BY MOUTH ONCE TO TWICE DAILY IF TOLERATED  Patient not taking: No sig  reported   [provider]  Active   QUEtiapine (SEROQUEL) 100 MG tablet [id number]  Take 1 tablet (100 mg total) by mouth at bedtime.  Patient not taking: Reported on 12/24/2020   Karen Billings, NP  Active   QUEtiapine (SEROQUEL) 25 MG tablet [id number]  Take 1-2 tablets every morning as needed. Continue taking 50 mg tablet nightly in addition Karen Billings, NP  Active            Med Note (Karen Foster   Tue Dec 24, 2020  4:30 PM) Taking 62m in the morning and at bedtime.  tiotropium (SPIRIVA HANDIHALER) 18 MCG inhalation capsule [id number] Place 1 capsule (18 mcg total) into inhaler and inhale daily.  Patient not taking: No sig reported   LVolney American [place] Active   triamcinolone cream (KENALOG) 0.1 % [id number] AFort [place] Patient not taking: No sig reported   LVolney American [place] Active             Patient Active Problem List   Diagnosis Date Noted   Other chronic pain 12/25/2020   Allergic rhinitis 05/26/2019   Urticaria 12/01/2018   COPD mixed type ([place] 12/01/2018   Cigarette smoker 06/08/2018   Arthritis 02/19/2018   Hemorrhoids 02/18/2018   Reactive airway disease 02/18/2018   Migraines 12/12/2017   Deviated septum 12/03/2017   Heart murmur 12/03/2017   Depression 12/03/2017   Panic disorder     Conditions to be addressed/monitored per PCP order:  [place]: General Plan of Care (Adult)  Updates made by RMelissa Montane RN since 01/27/2021 12:00 AM  Completed 01/27/2021   Problem: Therapeutic Alliance (General Plan of Care) Resolved 01/27/2021  Note:   Resolving due to duplicate goal     Long-Range Goal: Therapeutic Alliance Established Completed 01/27/2021  Start Date: 09/17/2020  Expected End Date: 01/27/2021  Recent Progress: On track  Priority: High  Note:    Current Barriers:  Ineffective Self Health Maintenance-Ms. Speir would like to better manage her health. She has started donating  plasma and feels that this is encouraging her to take better care of herself. She has not seen her PCP since 05/2019 and does not have all of her medications once prescribed. At last plasma donation her BP 136/81 and weight 134#. She currently smokes 1/2 pack of cigarettes Foster day and would like to cut back. Her housing is stable at this time and she would like to focus on her health now. She is in the process of switching insurance plans and wanted to make sure her current provider office would accept new plan. Patient is volunteering in the mornings and working on finding Foster job.-Update-[facility]. Muscatello [facility] she had Foster phone visit with PCP and has an office visit tomorrow. She did not attend scheduled cardiology appointment, she plans to reschedule after her PCP appointment tomorrow. She is working on smoking cessation and is down to less  than 1/4 pack Foster day. Does not attend all scheduled provider appointments Does not adhere to prescribed medication regimen Lacks social connections Does not maintain contact with provider office Does not contact provider office for questions/concerns Currently UNABLE TO independently self manage needs related to chronic health conditions.  Knowledge Deficits related to short term plan for care coordination needs and long term plans for chronic disease management needs Resolving due to duplicate goal Nurse Case Manager Clinical Goal(s):  patient will work with care management team to address care coordination and chronic disease management needs related to Disease Management   Interventions:  Advised patient to call today and reschedule missed Cardiology appointment Advised to discuss Ophthalmology and Pulmonology referral with PCP during visit on 12/25/20  Advised patient to make Foster list of questions and concerns to discuss with provider on 12/25/20 Provided encouragement for smoking cessation Provided information for Urgent Tooth [phone] to call and schedule  evaluation for new dentures Discussed plans with patient for ongoing care management follow up and provided patient with direct contact information for care management team, explained that  RNCM will work closely with patient to improve health management Provided patient with MyChart educational materials related to smoking cessation Reviewed medications, instructed patient to discuss questions regarding seroquel with PCP Self Care Activities:  Patient will attend all scheduled provider appointments Patient will continue to perform ADL's independently Patient will continue to perform IADL's independently Patient will call provider office for new concerns or questions Patient Goals: - call to schedule annual exam with PCP and Pulmonologist - arrange transportation for medical appointments 2-3 days before, with medical transportation provided by [facility] [phone] - call to cancel if needed - keep Foster calendar with appointment dates - learn the bus route  Follow Up Plan: Telephone follow up appointment with care management team member scheduled for:01/27/21 @ 3:45pm     Care Plan : [place] of Care  Updates made by Melissa Montane, RN since 01/27/2021 12:00 AM     Problem: CHL AMB "PATIENT-SPECIFIC PROBLEM"      Long-Range Goal: Patient-Specific Goal   Start Date: 01/27/2021  Expected End Date: 04/27/2021  Priority: High  Note:   Current Barriers:  Chronic Disease Management support and education needs related to HLD  RNCM Clinical Goal(s):  Patient will verbalize understanding of plan for management of HLD and COPD as evidenced by patient verbalization and self monitoring activity take all medications exactly as prescribed and will call provider for medication related questions as evidenced by documentation in EMR    attend all scheduled medical appointments: 01/29/21 with PCP as evidenced by provider documentation in EMR        continue to work with RN Care  Manager and/or Social Worker to address care management and care coordination needs related to HLD and COPD as evidenced by adherence to CM Team Scheduled appointments     through collaboration with Consulting civil engineer, provider, and care team.   Interventions: Inter-disciplinary care team collaboration (see longitudinal plan of care) Evaluation of current treatment plan related to  self management and patient's adherence to plan as established by provider   Hyperlipidemia:  (Status: New goal.) Long Term Goal  Lab Results  Component Value Date   CHOL 237 (H) 12/25/2020   HDL 33 (L) 12/25/2020   [place] 160 (H) 12/25/2020   TRIG 235 (H) 12/25/2020   CHOLHDL 7.2 (H) 12/25/2020     Medication review performed; medication list updated in electronic medical record.  Provider established cholesterol goals reviewed; Counseled on importance of regular laboratory monitoring as prescribed; Provided HLD educational materials; Reviewed role and benefits of statin for ASCVD risk reduction; Reviewed importance of limiting foods high in cholesterol; Reviewed exercise goals and target of 150 minutes per week; Assessed social determinant of health barriers;   Patient Goals/Self-Care Activities: Take medications as prescribed   Attend all scheduled provider appointments Call pharmacy for medication refills 3-7 days in advance of running out of medications Attend church or other social activities Perform all self care activities independently  Call provider office for new concerns or questions  - adhere to prescribed diet: heart healthy - develop an exercise routine       Follow Up:  Patient agrees to Care Plan and Follow-up.  Plan: The Managed Medicaid care management team will reach out to the patient again over the next 30 days.  Date/time of next scheduled RN care management/care coordination outreach:  02/26/21 @ 3:45pm  Lurena Joiner RN, BSN [place]  [facility]

## 2021-01-28 MED ORDER — GABAPENTIN 300 MG PO CAPS: 300.0000 mg | ORAL_CAPSULE | Freq: Four times a day (QID) | ORAL | 0 refills | Status: DC

## 2021-01-28 NOTE — Telephone Encounter (Signed)
Requested Prescriptions  Pending Prescriptions Disp Refills  . cyclobenzaprine (FLEXERIL) 5 MG tablet 60 tablet 0    Sig: Take 1 tablet (5 mg total) by mouth 3 (three) times daily as needed for muscle spasms.     Not Delegated - Analgesics:  Muscle Relaxants Failed - 01/27/2021  7:29 PM      Failed - This refill cannot be delegated      Passed - Valid encounter within last 6 months    Recent Outpatient Visits          1 month ago COPD mixed type [facility])   [facility] Larae Grooms, NP   2 months ago COPD mixed type [facility])   [facility] Larae Grooms, NP   1 year ago Cigarette smoker   [facility] [place], [place]   1 year ago Chronic cough   [facility] Roosvelt Maser [place], [place]   2 years ago Flu vaccine need   [facility] Gabriel Cirri, NP      Future Appointments            Tomorrow Larae Grooms, NP [facility]           . QUEtiapine (SEROQUEL) 25 MG tablet 120 tablet 0    Sig: Take 1 tab in the morning and 2 tabs at bedtime. Can increase to 3 tabs at bedtime after 1 week.     Not Delegated - Psychiatry:  Antipsychotics - Second Generation (Atypical) - quetiapine Failed - 01/27/2021  7:29 PM      Failed - This refill cannot be delegated      Failed - Completed PHQ-2 or PHQ-9 in the last 360 days      Passed - ALT in normal range and within 180 days    ALT  Date Value Ref Range Status  12/25/2020 12 0 - 32 IU/L Final         Passed - AST in normal range and within 180 days    AST  Date Value Ref Range Status  12/25/2020 17 0 - 40 IU/L Final         Passed - Last BP in normal range    BP Readings from Last 1 Encounters:  01/02/21 130/85         Passed - Valid encounter within last 6 months    Recent Outpatient Visits          1 month ago COPD mixed type (HCC)   [facility] Larae Grooms, NP   2 months ago COPD mixed type [facility])    [facility] Larae [facility], NP   1 year ago Cigarette smoker   [facility] [place], [place], [place]   1 year ago Chronic cough   [facility] Roosvelt Maser [place], [place]   2 years ago Flu vaccine need   [facility] Gabriel Cirri, NP      Future Appointments            Tomorrow Larae Grooms, NP [facility], [facility]           . gabapentin (NEURONTIN) 300 MG capsule 120 capsule 0    Sig: Take 1 capsule (300 mg total) by mouth 4 (four) times daily.     Neurology: Anticonvulsants - gabapentin Passed - 01/27/2021  7:29 PM      Passed - Valid encounter within last 12 months    Recent Outpatient Visits  1 month ago COPD mixed type [facility])   [facility] Larae Grooms, NP   2 months ago COPD mixed type [facility])   [facility] Larae Grooms, NP   1 year ago Cigarette smoker   [facility] Roosvelt Maser [place], [place]   1 year ago Chronic cough   [facility] Roosvelt Maser [place], [place]   2 years ago Flu vaccine need   [facility] Gabriel Cirri, NP      Future Appointments            Tomorrow Larae Grooms, NP [facility], [facility]

## 2021-01-28 NOTE — Telephone Encounter (Signed)
Requested medications are due for refill today.  yes  Requested medications are on the active medications list.  yes  Last refill. 12/25/2020 for both  Future visit scheduled.   yes  Notes to clinic.  Medications are not delegated.

## 2021-01-29 ENCOUNTER — Encounter: Payer: Medicaid Other | Admitting: Nurse Practitioner

## 2021-01-29 MED ORDER — CYCLOBENZAPRINE HCL 5 MG PO TABS: 5.0000 mg | ORAL_TABLET | Freq: Three times a day (TID) | ORAL | 0 refills | Status: DC | PRN

## 2021-01-29 MED ORDER — QUETIAPINE FUMARATE 25 MG PO TABS: ORAL_TABLET | ORAL | 0 refills | Status: DC

## 2021-01-29 NOTE — Progress Notes (Deleted)
There were no vitals taken for this visit.   Subjective:    Patient ID: Karen Foster, female    DOB: 08-12-1960, 60 y.o.   MRN: [id number]  HPI: Karen Foster is a 60 y.o. female presenting on 01/29/2021 for comprehensive medical examination. Current medical complaints include:{Blank single:19197::"none","***"}  She currently lives with: Menopausal Symptoms: {Blank single:19197::"yes","no"}  COPD COPD status: {Blank single:19197::"controlled","uncontrolled","better","worse","exacerbated","stable"} Satisfied with current treatment?: {Blank single:19197::"yes","no"} Oxygen use: {Blank single:19197::"yes","no"} Dyspnea frequency:  Cough frequency:  Rescue inhaler frequency:   Limitation of activity: {Blank single:19197::"yes","no"} Productive cough:  Last Spirometry:  Pneumovax: {Blank single:19197::"Up to Date","Not up to Date","unknown"} Influenza: {Blank single:19197::"Up to Date","Not up to Date","unknown"}   DEPRESSION/ANXIETY   Depression Screen done today and results listed below:  Depression screen [facility] 2/9 12/01/2018 12/01/2018 07/13/2018 06/08/2018 02/18/2018  Decreased Interest 0 0 0 0 2  Down, Depressed, Hopeless 0 0 0 0 1  PHQ - 2 Score 0 0 0 0 3  Altered sleeping 1 1 0 0 2  Tired, decreased energy 0 0 0 0 2  Change in appetite 1 1 0 0 2  Feeling bad or failure about yourself  0 0 0 0 1  Trouble concentrating 0 0 0 0 2  Moving slowly or fidgety/restless 0 0 0 0 2  Suicidal thoughts 0 0 0 0 0  PHQ-9 Score 2 2 0 0 14    The patient {has/does not have:19849} a history of falls. I {did/did not:19850} complete a risk assessment for falls. A plan of care for falls {was/was not:19852} documented.   Past Medical History:  Past Medical History:  Diagnosis Date   Allergy    Anxiety    Arthritis    Depression    GERD (gastroesophageal reflux disease)    Glaucoma    Heart murmur    Kidney stones    Migraines    Panic attack    Panic attack     Surgical  History:  Past Surgical History:  Procedure Laterality Date   ABDOMINAL HYSTERECTOMY     FRACTURE SURGERY     JOINT REPLACEMENT  2011   Hip   REVISION TOTAL HIP ARTHROPLASTY  2011   SEPTOPLASTY  2007   TONSILLECTOMY      Medications:  Current Outpatient Medications on File Prior to Visit  Medication Sig   albuterol (VENTOLIN HFA) 108 (90 Base) MCG/ACT inhaler Inhale 2 puffs into the lungs every 6 (six) hours as needed for wheezing or shortness of breath.   benzonatate (TESSALON) 200 MG capsule TAKE 1 CAPSULE BY MOUTH TWICE DAILY AS NEEDED FOR COUGH   budesonide-formoterol (SYMBICORT) 160-4.5 MCG/ACT inhaler Inhale 2 puffs into the lungs 2 (two) times daily.   cyclobenzaprine (FLEXERIL) 5 MG tablet Take 1 tablet (5 mg total) by mouth 3 (three) times daily as needed for muscle spasms.   diclofenac sodium (VOLTAREN) 1 % GEL Apply 2 g topically 4 (four) times daily as needed. (Patient not taking: No sig reported)   DULoxetine (CYMBALTA) 20 MG capsule Take 1 capsule (20 mg total) by mouth daily.   gabapentin (NEURONTIN) 300 MG capsule Take 1 capsule (300 mg total) by mouth 4 (four) times daily.   montelukast (SINGULAIR) 10 MG tablet Take 1 tablet (10 mg total) by mouth at bedtime.   omeprazole (PRILOSEC) 20 MG capsule Take 1 capsule (20 mg total) by mouth daily.   QUEtiapine (SEROQUEL) 25 MG tablet Take 1 tab in the morning and 2 tabs at bedtime. Can  increase to 3 tabs at bedtime after 1 week.   rosuvastatin (CRESTOR) 5 MG tablet Take 1 tablet (5 mg total) by mouth daily.   No current facility-administered medications on file prior to visit.    Allergies:  Allergies  Allergen Reactions   Ibuprofen Anaphylaxis   Bee Venom    Hydroxyzine Nausea Only   Sulfa Antibiotics Other (See Comments)   Tomato     Social History:  Social History   Socioeconomic History   Marital status: Widowed    Spouse name: Not on file   Number of children: Not on file   Years of education: Not on file    Highest education level: Not on file  Occupational History   Not on file  Tobacco Use   Smoking status: Former    Packs/day: 0.50    Types: Cigarettes    Quit date: 2021    Years since quitting: 1.9   Smokeless tobacco: Never  Vaping Use   Vaping Use: Never used  Substance and Sexual Activity   Alcohol use: No   Drug use: No   Sexual activity: Not Currently  Other Topics Concern   Not on file  Social History Narrative   Not on file   Social Determinants of Health   Financial Resource Strain: Not on file  Food Insecurity: Not on file  Transportation Needs: Unmet Transportation Needs   Lack of Transportation (Medical): Yes   Lack of Transportation (Non-Medical): Yes  Physical Activity: Not on file  Stress: Not on file  Social Connections: Moderately Isolated   Frequency of Communication with Friends and Family: More than three times a week   Frequency of Social Gatherings with Friends and Family: More than three times a week   Attends Religious Services: Never   Marine scientist or Organizations: Yes   Attends Music therapist: More than 4 times per year   Marital Status: Widowed  Human resources officer Violence: Not on file   Social History   Tobacco Use  Smoking Status Former   Packs/day: 0.50   Types: Cigarettes   Quit date: 2021   Years since quitting: 1.9  Smokeless Tobacco Never   Social History   Substance and Sexual Activity  Alcohol Use No    Family History:  Family History  Problem Relation Age of Onset   Kidney failure Mother    Heart attack Mother    Asthma Mother    Kidney disease Mother    Diabetes Mother    Kidney failure Maternal Grandmother    Multiple myeloma Maternal Grandmother    Other Maternal Grandmother        multiple myeloma   Cancer Father    Heart disease Maternal Aunt    Hypertension Maternal Aunt    Cancer Maternal Uncle    Asthma Maternal Uncle    Heart attack Paternal Aunt    Heart attack Paternal  Uncle    Diabetes Paternal Uncle    Heart attack Maternal Grandfather    Hyperlipidemia Maternal Grandfather    Diabetes Maternal Grandfather    Prostate cancer Neg Hx    Bladder Cancer Neg Hx     Past medical history, surgical history, medications, allergies, family history and social history reviewed with patient today and changes made to appropriate areas of the chart.   ROS All other ROS negative except what is listed above and in the HPI.      Objective:    There were no vitals taken  for this visit.  Wt Readings from Last 3 Encounters:  12/25/20 135 lb 3.2 oz (61.3 kg)  04/10/20 105 lb (47.6 kg)  12/01/18 123 lb (55.8 kg)    Physical Exam  Results for orders placed or performed in visit on 12/25/20  Comp Met (CMET)  Result Value Ref Range   Glucose 108 (H) 70 - 99 mg/dL   BUN 11 6 - 24 mg/dL   Creatinine, Ser 0.74 0.57 - 1.00 mg/dL   eGFR 93 >59 mL/min/1.73   BUN/Creatinine Ratio 15 9 - 23   Sodium 142 134 - 144 mmol/L   Potassium 4.0 3.5 - 5.2 mmol/L   Chloride 104 96 - 106 mmol/L   CO2 20 20 - 29 mmol/L   Calcium 8.9 8.7 - 10.2 mg/dL   Total Protein 6.1 6.0 - 8.5 g/dL   Albumin 4.0 3.8 - 4.9 g/dL   Globulin, Total 2.1 1.5 - 4.5 g/dL   Albumin/Globulin Ratio 1.9 1.2 - 2.2   Bilirubin Total <0.2 0.0 - 1.2 mg/dL   Alkaline Phosphatase 86 44 - 121 IU/L   AST 17 0 - 40 IU/L   ALT 12 0 - 32 IU/L  Lipid Profile  Result Value Ref Range   Cholesterol, Total 237 (H) 100 - 199 mg/dL   Triglycerides 235 (H) 0 - 149 mg/dL   HDL 33 (L) >39 mg/dL   VLDL Cholesterol Cal 44 (H) 5 - 40 mg/dL   LDL Chol Calc (NIH) 160 (H) 0 - 99 mg/dL   Chol/HDL Ratio 7.2 (H) 0.0 - 4.4 ratio  Hepatitis C Antibody  Result Value Ref Range   Hep C Virus Ab <0.1 0.0 - 0.9 s/co ratio  HIV Antibody (routine testing w rflx)  Result Value Ref Range   HIV Screen 4th Generation wRfx Non Reactive Non Reactive      Assessment & Plan:   Problem List Items Addressed This Visit        Respiratory   COPD mixed type ([place])     Other   Panic disorder   Depression   Other Visit Diagnoses     Annual physical exam    -  Primary        Follow up plan: No follow-ups on file.   LABORATORY TESTING:  - Pap smear: {Blank ZOXWRU:04540::"JWJ done","not applicable","up to date","done elsewhere"}  IMMUNIZATIONS:   - Tdap: Tetanus vaccination status reviewed: {tetanus status:315746}. - Influenza: {Blank single:19197::"Up to date","Administered today","Postponed to flu season","Refused","Given elsewhere"} - Pneumovax: {Blank single:19197::"Up to date","Administered today","Not applicable","Refused","Given elsewhere"} - Prevnar: {Blank single:19197::"Up to date","Administered today","Not applicable","Refused","Given elsewhere"} - COVID: {Blank single:19197::"Up to date","Administered today","Not applicable","Refused","Given elsewhere"} - HPV: {Blank single:19197::"Up to date","Administered today","Not applicable","Refused","Given elsewhere"} - Shingrix vaccine: {Blank single:19197::"Up to date","Administered today","Not applicable","Refused","Given elsewhere"}  SCREENING: -Mammogram: {Blank single:19197::"Up to date","Ordered today","Not applicable","Refused","Done elsewhere"}  - Colonoscopy: {Blank single:19197::"Up to date","Ordered today","Not applicable","Refused","Done elsewhere"}  - Bone Density: {Blank single:19197::"Up to date","Ordered today","Not applicable","Refused","Done elsewhere"}  -Hearing Test: {Blank single:19197::"Up to date","Ordered today","Not applicable","Refused","Done elsewhere"}  -Spirometry: {Blank single:19197::"Up to date","Ordered today","Not applicable","Refused","Done elsewhere"}   PATIENT COUNSELING:   Advised to take 1 mg of folate supplement per day if capable of pregnancy.   Sexuality: Discussed sexually transmitted diseases, partner selection, use of condoms, avoidance of unintended pregnancy  and contraceptive alternatives.   Advised to  avoid cigarette smoking.  I discussed with the patient that most people either abstain from alcohol or drink within safe limits (<=14/week and <=4 drinks/occasion for males, <=7/weeks and <= 3 drinks/occasion for  females) and that the risk for alcohol disorders and other health effects rises proportionally with the number of drinks per week and how often a drinker exceeds daily limits.  Discussed cessation/primary prevention of drug use and availability of treatment for abuse.   Diet: Encouraged to adjust caloric intake to maintain  or achieve ideal body weight, to reduce intake of dietary saturated fat and total fat, to limit sodium intake by avoiding high sodium foods and not adding table salt, and to maintain adequate dietary potassium and calcium preferably from fresh fruits, vegetables, and low-fat dairy products.    stressed the importance of regular exercise  Injury prevention: Discussed safety belts, safety helmets, smoke detector, smoking near bedding or upholstery.   Dental health: Discussed importance of regular tooth brushing, flossing, and dental visits.    NEXT PREVENTATIVE PHYSICAL DUE IN 1 YEAR. No follow-ups on file.

## 2021-01-29 NOTE — Telephone Encounter (Signed)
Requested Prescriptions  Pending Prescriptions Disp Refills  . benzonatate (TESSALON) 200 MG capsule [Pharmacy Med Name: Benzonatate 200 MG Oral Capsule] 60 capsule 0    Sig: TAKE 1 CAPSULE BY MOUTH TWICE DAILY AS NEEDED FOR COUGH     Ear, Nose, and Throat:  Antitussives/Expectorants Passed - 01/27/2021  4:57 PM      Passed - Valid encounter within last 12 months    Recent Outpatient Visits          1 month ago COPD mixed type [facility])   [facility] Larae Grooms, NP   2 months ago COPD mixed type [facility])   [facility] Larae Grooms, NP   1 year ago Cigarette smoker   [facility] Particia Nearing, [place]   1 year ago Chronic cough   [facility], [place]   2 years ago Flu vaccine need   [facility] Gabriel Cirri, NP      Future Appointments            Today Larae Grooms, NP [facility]

## 2021-01-30 DIAGNOSIS — Z419 Encounter for procedure for purposes other than remedying health state, unspecified: Secondary | ICD-10-CM | POA: Diagnosis not present

## 2021-02-01 DIAGNOSIS — F4310 Post-traumatic stress disorder, unspecified: Secondary | ICD-10-CM | POA: Diagnosis not present

## 2021-02-01 DIAGNOSIS — F411 Generalized anxiety disorder: Secondary | ICD-10-CM | POA: Diagnosis not present

## 2021-02-02 DIAGNOSIS — F411 Generalized anxiety disorder: Secondary | ICD-10-CM | POA: Diagnosis not present

## 2021-02-02 DIAGNOSIS — F4310 Post-traumatic stress disorder, unspecified: Secondary | ICD-10-CM | POA: Diagnosis not present

## 2021-02-08 DIAGNOSIS — F4310 Post-traumatic stress disorder, unspecified: Secondary | ICD-10-CM | POA: Diagnosis not present

## 2021-02-08 DIAGNOSIS — F411 Generalized anxiety disorder: Secondary | ICD-10-CM | POA: Diagnosis not present

## 2021-02-09 DIAGNOSIS — F411 Generalized anxiety disorder: Secondary | ICD-10-CM | POA: Diagnosis not present

## 2021-02-09 DIAGNOSIS — F4310 Post-traumatic stress disorder, unspecified: Secondary | ICD-10-CM | POA: Diagnosis not present

## 2021-02-10 ENCOUNTER — Other Ambulatory Visit: Payer: Medicaid Other | Admitting: *Deleted

## 2021-02-10 ENCOUNTER — Other Ambulatory Visit: Payer: Self-pay

## 2021-02-10 NOTE — Patient Instructions (Signed)
Visit Information  Karen Foster was given information about Medicaid Managed Care team care coordination services as a part of their [facility] Medicaid benefit. Karen Foster verbally consented to engagement with the [facility] Managed Care team.   If you are experiencing a medical emergency, please call 911 or report to your local emergency department or urgent care.   If you have a non-emergency medical problem during routine business hours, please contact your provider's office and ask to speak with a nurse.   For questions related to your [facility] health plan, please call: [phone] or go here:[email or web address]  If you would like to schedule transportation through your [facility] plan, please call the following number at least 2 days in advance of your appointment: [phone].  Call the [facility] Crisis Line at [phone], at any time, 24 hours a day, 7 days a week. If you are in danger or need immediate medical attention call 911.  If you would like help to quit smoking, call 1-800-QUIT-NOW (402-880-3444) OR Espaol: 1-855-Djelo-Ya (2-725-366-4403) o para ms informacin haga clic aqu or Text READY to 474-259 to register via text  Karen Foster - following are the goals we discussed in your visit today:   Goals Addressed   None     Please see education materials related to COPD provided by MyChart link.  Patient has access to MyChart and can view provided education  Telephone follow up appointment with Managed Medicaid care management team member scheduled for:02/26/21 @ 3:45pm  Estanislado Emms RN, BSN [place]  [facility] RN Care Coordinator   Following is a copy of your plan of care:  Care Plan : RN Care Manager Plan of Care  Updates made by Heidi Dach, RN since 02/10/2021 12:00 AM     Problem: Chronic health managment needs related to COPD      Long-Range Goal: Development of Plan of Care to address health  managment needs related to COPD   Start Date: 01/27/2021  Expected End Date: 04/27/2021  Priority: High  Note:   Current Barriers:  Chronic Disease Management support and education needs related to HLD-Patient called RNCM concerned about weight gain. Patient has gained #18 since 10/28. Did not keep office appointment with PCP, patient needs to reschedule. Patient denies changes to diet, activity or medications.  RNCM Clinical Goal(s):  Patient will verbalize understanding of plan for management of HLD and COPD as evidenced by patient verbalization and self monitoring activity take all medications exactly as prescribed and will call provider for medication related questions as evidenced by documentation in EMR    attend all scheduled medical appointments: 01/29/21 with PCP as evidenced by provider documentation in EMR        continue to work with RN Care Manager and/or Social Worker to address care management and care coordination needs related to HLD and COPD as evidenced by adherence to CM Team Scheduled appointments     through collaboration with Medical illustrator, provider, and care team.   Interventions: Inter-disciplinary care team collaboration (see longitudinal plan of care) Evaluation of current treatment plan related to  self management and patient's adherence to plan as established by provider Advised patient to schedule PCP appointment to discuss weight gain Advised to contact PCP with questions or concerns Provided therapeutic listening   Hyperlipidemia:  (Status: New goal.) Long Term Goal  Lab Results  Component Value Date   CHOL 237 (H) 12/25/2020   HDL 33 (L) 12/25/2020   LDLCALC 160 (H)  12/25/2020   TRIG 235 (H) 12/25/2020   CHOLHDL 7.2 (H) 12/25/2020     Medication review performed; medication list updated in electronic medical record.  Provider established cholesterol goals reviewed; Counseled on importance of regular laboratory monitoring as prescribed; Provided HLD  educational materials; Reviewed role and benefits of statin for ASCVD risk reduction; Reviewed importance of limiting foods high in cholesterol; Reviewed exercise goals and target of 150 minutes per week; Assessed social determinant of health barriers;   Patient Goals/Self-Care Activities: Take medications as prescribed   Attend all scheduled provider appointments Call pharmacy for medication refills 3-7 days in advance of running out of medications Attend church or other social activities Perform all self care activities independently  Call provider office for new concerns or questions  - adhere to prescribed diet: heart healthy - develop an exercise routine

## 2021-02-10 NOTE — Patient Outreach (Signed)
Medicaid Managed Care   Nurse Care Manager Note  02/10/2021 Name:  Karen Foster MRN:  [id number] DOB:  08-23-1960  Karen Foster is an 60 y.o. year old female who is a primary patient of Karen Grooms, NP.  The [facility] Managed Care Coordination team was consulted for assistance with:    COPD  Karen Foster was given information about Medicaid Managed Care Coordination team services today. Karen Foster Patient agreed to services and verbal consent obtained.  Engaged with patient by telephone for follow up visit in response to provider referral for case management and/or care coordination services.   Assessments/Interventions:  Review of past medical history, allergies, medications, health status, including review of consultants reports, laboratory and other test data, was performed as part of comprehensive evaluation and provision of chronic care management services.  SDOH (Social Determinants of Health) assessments and interventions performed:   Care Plan  Allergies  Allergen Reactions   Ibuprofen Anaphylaxis   Bee Venom    Hydroxyzine Nausea Only   Sulfa Antibiotics Other (See Comments)   Tomato     Medications Reviewed Today             Patient Active Problem List   Diagnosis Date Noted   Other chronic pain 12/25/2020   Allergic rhinitis 05/26/2019   Urticaria 12/01/2018   COPD mixed type ([facility]) 12/01/2018   Cigarette smoker 06/08/2018   Arthritis 02/19/2018   Hemorrhoids 02/18/2018   Reactive airway disease 02/18/2018   Migraines 12/12/2017   Deviated septum 12/03/2017   Heart murmur 12/03/2017   Depression 12/03/2017   Panic disorder     Conditions to be addressed/monitored per PCP order:  COPD  Care Plan : RN Care Manager Plan of Care  Updates made by Heidi Dach, RN since 02/10/2021 12:00 AM     Problem: Chronic health managment needs related to COPD      Long-Range Goal: Development of Plan of Care to address health managment needs related to  COPD   Start Date: 01/27/2021  Expected End Date: 04/27/2021  Priority: High  Note:   Current Barriers:  Chronic Disease Management support and education needs related to HLD-Patient called RNCM concerned about weight gain. Patient has gained #18 since 10/28. Did not keep office appointment with PCP, patient needs to reschedule. Patient denies changes to diet, activity or medications.  RNCM Clinical Goal(s):  Patient will verbalize understanding of plan for management of HLD and COPD as evidenced by patient verbalization and self monitoring activity take all medications exactly as prescribed and will call provider for medication related questions as evidenced by documentation in EMR    attend all scheduled medical appointments: 01/29/21 with PCP as evidenced by provider documentation in EMR        continue to work with RN Care Manager and/or Social Worker to address care management and care coordination needs related to HLD and COPD as evidenced by adherence to CM Team Scheduled appointments     through collaboration with Medical illustrator, provider, and care team.   Interventions: Inter-disciplinary care team collaboration (see longitudinal plan of care) Evaluation of current treatment plan related to  self management and patient's adherence to plan as established by provider Advised patient to schedule PCP appointment to discuss weight gain Advised to contact PCP with questions or concerns Provided therapeutic listening   Hyperlipidemia:  (Status: New goal.) Long Term Goal  Lab Results  Component Value Date   CHOL 237 (H) 12/25/2020   HDL  33 (L) 12/25/2020   LDLCALC 160 (H) 12/25/2020   TRIG 235 (H) 12/25/2020   CHOLHDL 7.2 (H) 12/25/2020     Medication review performed; medication list updated in electronic medical record.  Provider established cholesterol goals reviewed; Counseled on importance of regular laboratory monitoring as prescribed; Provided HLD educational  materials; Reviewed role and benefits of statin for ASCVD risk reduction; Reviewed importance of limiting foods high in cholesterol; Reviewed exercise goals and target of 150 minutes per week; Assessed social determinant of health barriers;   Patient Goals/Self-Care Activities: Take medications as prescribed   Attend all scheduled provider appointments Call pharmacy for medication refills 3-7 days in advance of running out of medications Attend church or other social activities Perform all self care activities independently  Call provider office for new concerns or questions  - adhere to prescribed diet: heart healthy - develop an exercise routine       Follow Up:  Patient agrees to Care Plan and Follow-up.  Plan: The Managed Medicaid care management team will reach out to the patient again over the next 14 days.  Date/time of next scheduled RN care management/care coordination outreach:     04/29/20 @ 3:45pm during next scheduled visit  Estanislado Emms RN, BSN [place]  [facility] RN Care Coordinator

## 2021-02-11 ENCOUNTER — Ambulatory Visit: Payer: Self-pay

## 2021-02-11 NOTE — Telephone Encounter (Signed)
°  Chief Complaint: Weight gain Symptoms: "Puffiness to ankles and face." 20 lb. Weight gain in 2 months Frequency:  Pertinent Negatives: Patient denies Chest pain or shortness of breath Disposition: [] ED /[] Urgent Care (no appt availability in office) / [] Appointment(In office/virtual)/ []  [place] [facility]/ [] Home Care/ [] Refused Recommended Disposition  Additional Notes: Pt. Has an appointment this Friday and states she "will be fine until then." Instructed to call back for worsening of symptoms.      Answer Assessment - Initial Assessment Questions 1. ONSET: "When did the swelling start?" (e.g., minutes, hours, days)     Noticed swelling to ankles 2. LOCATION: "What part of the leg is swollen?"  "Are both legs swollen or just one leg?"     Both legs 3. SEVERITY: "How bad is the swelling?" (e.g., localized; mild, moderate, severe)  - Localized - small area of swelling localized to one leg  - MILD pedal edema - swelling limited to foot and ankle, pitting edema < 1/4 inch (6 mm) deep, rest and elevation eliminate most or all swelling  - MODERATE edema - swelling of lower leg to knee, pitting edema > 1/4 inch (6 mm) deep, rest and elevation only partially reduce swelling  - SEVERE edema - swelling extends above knee, facial or hand swelling present      Mild 4. REDNESS: "Does the swelling look red or infected?"     No 5. PAIN: "Is the swelling painful to touch?" If Yes, ask: "How painful is it?"   (Scale 1-10; mild, moderate or severe)     No 6. FEVER: "Do you have a fever?" If Yes, ask: "What is it, how was it measured, and when did it start?"      No 7. CAUSE: "What do you think is causing the leg swelling?"     Unsure 8. MEDICAL HISTORY: "Do you have a history of heart failure, kidney disease, liver failure, or cancer?"     No 9. RECURRENT SYMPTOM: "Have you had leg swelling before?" If Yes, ask: "When was the last time?" "What happened that time?"     No 10. OTHER SYMPTOMS:  "Do you have any other symptoms?" (e.g., chest pain, difficulty breathing)       No 11. PREGNANCY: "Is there any chance you are pregnant?" "When was your last menstrual period?"       No  Protocols used: Leg Swelling and Edema-A-AH

## 2021-02-14 ENCOUNTER — Other Ambulatory Visit (HOSPITAL_COMMUNITY)
Admission: RE | Admit: 2021-02-14 | Discharge: 2021-02-14 | Disposition: A | Discharge status: Home / Self Care | Payer: Medicaid Other | Source: Ambulatory Visit | Attending: Nurse Practitioner | Admitting: Nurse Practitioner

## 2021-02-14 ENCOUNTER — Telehealth: Payer: Self-pay | Admitting: Nurse Practitioner

## 2021-02-14 ENCOUNTER — Other Ambulatory Visit: Payer: Self-pay

## 2021-02-14 ENCOUNTER — Encounter: Payer: Self-pay | Admitting: Nurse Practitioner

## 2021-02-14 ENCOUNTER — Ambulatory Visit (INDEPENDENT_AMBULATORY_CARE_PROVIDER_SITE_OTHER): Payer: Medicaid Other | Admitting: Nurse Practitioner

## 2021-02-14 VITALS — BP 115/69 | HR 111 | Temp 98.7°F | Ht 64.7 in | Wt 151.6 lb

## 2021-02-14 DIAGNOSIS — F17210 Nicotine dependence, cigarettes, uncomplicated: Secondary | ICD-10-CM

## 2021-02-14 DIAGNOSIS — R000 Tachycardia, unspecified: Secondary | ICD-10-CM | POA: Diagnosis not present

## 2021-02-14 DIAGNOSIS — F332 Major depressive disorder, recurrent severe without psychotic features: Secondary | ICD-10-CM | POA: Diagnosis not present

## 2021-02-14 DIAGNOSIS — F17200 Nicotine dependence, unspecified, uncomplicated: Secondary | ICD-10-CM | POA: Diagnosis not present

## 2021-02-14 DIAGNOSIS — J449 Chronic obstructive pulmonary disease, unspecified: Secondary | ICD-10-CM

## 2021-02-14 DIAGNOSIS — Z124 Encounter for screening for malignant neoplasm of cervix: Secondary | ICD-10-CM

## 2021-02-14 DIAGNOSIS — F410 Panic disorder [episodic paroxysmal anxiety] without agoraphobia: Secondary | ICD-10-CM

## 2021-02-14 DIAGNOSIS — Z23 Encounter for immunization: Secondary | ICD-10-CM | POA: Diagnosis not present

## 2021-02-14 DIAGNOSIS — Z1231 Encounter for screening mammogram for malignant neoplasm of breast: Secondary | ICD-10-CM | POA: Diagnosis not present

## 2021-02-14 DIAGNOSIS — Z0000 Encounter for general adult medical examination without abnormal findings: Secondary | ICD-10-CM | POA: Diagnosis not present

## 2021-02-14 DIAGNOSIS — J4540 Moderate persistent asthma, uncomplicated: Secondary | ICD-10-CM

## 2021-02-14 MED ORDER — ALBUTEROL SULFATE HFA 108 (90 BASE) MCG/ACT IN AERS: 2.0000 | INHALATION_SPRAY | Freq: Four times a day (QID) | RESPIRATORY_TRACT | 3 refills | Status: DC | PRN

## 2021-02-14 MED ORDER — CYCLOBENZAPRINE HCL 5 MG PO TABS: 5.0000 mg | ORAL_TABLET | Freq: Three times a day (TID) | ORAL | 1 refills | Status: DC | PRN

## 2021-02-14 MED ORDER — ROSUVASTATIN CALCIUM 5 MG PO TABS: 5.0000 mg | ORAL_TABLET | Freq: Every day | ORAL | 1 refills | Status: DC

## 2021-02-14 MED ORDER — ROSUVASTATIN CALCIUM 10 MG PO TABS: 10.0000 mg | ORAL_TABLET | Freq: Every day | ORAL | 1 refills | Status: DC

## 2021-02-14 MED ORDER — BENZONATATE 200 MG PO CAPS: 200.0000 mg | ORAL_CAPSULE | Freq: Three times a day (TID) | ORAL | 1 refills | Status: DC | PRN

## 2021-02-14 MED ORDER — GUAIFENESIN-CODEINE 100-10 MG/5ML PO SOLN: 5.0000 mL | Freq: Two times a day (BID) | ORAL | 0 refills | Status: DC | PRN

## 2021-02-14 MED ORDER — GABAPENTIN 300 MG PO CAPS: 300.0000 mg | ORAL_CAPSULE | Freq: Four times a day (QID) | ORAL | 1 refills | Status: DC

## 2021-02-14 MED ORDER — MONTELUKAST SODIUM 10 MG PO TABS: 10.0000 mg | ORAL_TABLET | Freq: Every day | ORAL | 1 refills | Status: DC

## 2021-02-14 MED ORDER — QUETIAPINE FUMARATE 25 MG PO TABS: ORAL_TABLET | ORAL | 1 refills | Status: DC

## 2021-02-14 MED ORDER — DULOXETINE HCL 20 MG PO CPEP: 20.0000 mg | ORAL_CAPSULE | Freq: Every day | ORAL | 1 refills | Status: DC

## 2021-02-14 MED ORDER — PANTOPRAZOLE SODIUM 20 MG PO TBEC: 20.0000 mg | DELAYED_RELEASE_TABLET | Freq: Every day | ORAL | 1 refills | Status: DC

## 2021-02-14 MED ORDER — BUDESONIDE-FORMOTEROL FUMARATE 160-4.5 MCG/ACT IN AERO: 2.0000 | INHALATION_SPRAY | Freq: Two times a day (BID) | RESPIRATORY_TRACT | 3 refills | Status: DC

## 2021-02-14 NOTE — Assessment & Plan Note (Addendum)
Chronic. Cough is exacerbated this time of year. States she has taken Tussinex for a couple of days helps improved her symptoms. Consistently taking inhalers and using Albuterol PRN. Follow up in 3 months. If cough not improved, will send to Pulmonology. Refills of tessalon and inhalers sent today.

## 2021-02-14 NOTE — Assessment & Plan Note (Addendum)
Chronic.  Controlled.  Continue with current medication regimen on Seroquel 25mg daily.  Refills sent today. Labs ordered today.  Return to clinic in 3 months for reevaluation.  Call sooner if concerns arise.  ° °

## 2021-02-14 NOTE — Progress Notes (Addendum)
BP 115/69    Pulse (!) 111    Temp 98.7 F (37.1 C) (Oral)    Ht 5' 4.7" (1.643 m)    Wt 151 lb 9.6 oz (68.8 kg)    SpO2 97%    BMI 25.46 kg/m    Subjective:    Patient ID: Karen Foster, female    DOB: 1960-04-11, 60 y.o.   MRN: [id number]  HPI: Karen Foster is a 60 y.o. female presenting on 02/14/2021 for comprehensive medical examination. Current medical complaints include:none  She currently lives with: Menopausal Symptoms: no  Patient states her face looks puffy as well as her lower legs.  She feels like she has gained weight.   COPD COPD status: uncontrolled Satisfied with current treatment?: yes Oxygen use: no Dyspnea frequency:  Cough frequency: yes- currently coughing due to the weather Rescue inhaler frequency:   Limitation of activity: yes Productive cough:  Last Spirometry:  Pneumovax: Up to Date Influenza: Up to Date  Depression Screen done today and results listed below:  Depression screen [facility] 2/9 02/14/2021 12/01/2018 12/01/2018 07/13/2018 06/08/2018  Decreased Interest 0 0 0 0 0  Down, Depressed, Hopeless 0 0 0 0 0  PHQ - 2 Score 0 0 0 0 0  Altered sleeping _0 0 0  Tired, decreased energy 1 0 0 0 0  Change in appetite _1 0 0  Feeling bad or failure about yourself  0 0 0 0 0  Trouble concentrating 0 0 0 0 0  Moving slowly or fidgety/restless 0 0 0 0 0  Suicidal thoughts 0 0 0 0 0  PHQ-9 Score _2 0 0  Difficult doing work/chores Not difficult at all - - - -    The patient does not have a history of falls. I did complete a risk assessment for falls. A plan of care for falls was documented.   Past Medical History:  Past Medical History:  Diagnosis Date   Allergy    Anxiety    Arthritis    Depression    GERD (gastroesophageal reflux disease)    Glaucoma    Heart murmur    Kidney stones    Migraines    Panic attack    Panic attack     Surgical History:  Past Surgical History:  Procedure Laterality Date   ABDOMINAL  HYSTERECTOMY     FRACTURE SURGERY     JOINT REPLACEMENT  2011   Hip   REVISION TOTAL HIP ARTHROPLASTY  2011   SEPTOPLASTY  2007   TONSILLECTOMY      Medications:  Current Outpatient Medications on File Prior to Visit  Medication Sig   diclofenac sodium (VOLTAREN) 1 % GEL Apply 2 g topically 4 (four) times daily as needed.   No current facility-administered medications on file prior to visit.    Allergies:  Allergies  Allergen Reactions   Ibuprofen Anaphylaxis   Bee Venom    Hydroxyzine Nausea Only   Sulfa Antibiotics Other (See Comments)   Tomato     Social History:  Social History   Socioeconomic History   Marital status: Widowed    Spouse name: Not on file   Number of children: Not on file   Years of education: Not on file   Highest education level: Not on file  Occupational History   Not on file  Tobacco Use   Smoking status: Every Day    Packs/day: 1.00    Types:  Cigarettes   Smokeless tobacco: Never  Vaping Use   Vaping Use: Never used  Substance and Sexual Activity   Alcohol use: No   Drug use: No   Sexual activity: Not Currently  Other Topics Concern   Not on file  Social History Narrative   Not on file   Social Determinants of Health   Financial Resource Strain: Not on file  Food Insecurity: Not on file  Transportation Needs: Unmet Transportation Needs   Lack of Transportation (Medical): Yes   Lack of Transportation (Non-Medical): Yes  Physical Activity: Not on file  Stress: Not on file  Social Connections: Moderately Isolated   Frequency of Communication with Friends and Family: More than three times a week   Frequency of Social Gatherings with Friends and Family: More than three times a week   Attends Religious Services: Never   Marine scientist or Organizations: Yes   Attends Music therapist: More than 4 times per year   Marital Status: Widowed  Human resources officer Violence: Not on file    Social History   Tobacco Use  Smoking Status Every Day   Packs/day: 1.00   Types: Cigarettes  Smokeless Tobacco Never   Social History   Substance and Sexual Activity  Alcohol Use No    Family History:  Family History  Problem Relation Age of Onset   Kidney failure Mother    Heart attack Mother    Asthma Mother    Kidney disease Mother    Diabetes Mother    Kidney failure Maternal Grandmother    Multiple myeloma Maternal Grandmother    Other Maternal Grandmother        multiple myeloma   Cancer Father    Heart disease Maternal Aunt    Hypertension Maternal Aunt    Cancer Maternal Uncle    Asthma Maternal Uncle    Heart attack Paternal Aunt    Heart attack Paternal Uncle    Diabetes Paternal Uncle    Heart attack Maternal Grandfather    Hyperlipidemia Maternal Grandfather    Diabetes Maternal Grandfather    Prostate cancer Neg Hx    Bladder Cancer Neg Hx     Past medical history, surgical history, medications, allergies, family history and social history reviewed with patient today and changes made to appropriate areas of the chart.   ROS All other ROS negative except what is listed above and in the HPI.      Objective:    BP 115/69    Pulse (!) 111    Temp 98.7 F (37.1 C) (Oral)    Ht 5' 4.7" (1.643 m)    Wt 151 lb 9.6 oz (68.8 kg)    SpO2 97%    BMI 25.46 kg/m   Wt Readings from Last 3 Encounters:  02/14/21 151 lb 9.6 oz (68.8 kg)  12/25/20 135 lb 3.2 oz (61.3 kg)  04/10/20 105 lb (47.6 kg)    Physical Exam Vitals and nursing note reviewed. Exam conducted with a chaperone present Karen Foster, [place]).  Constitutional:      General: She is awake. She is not in acute distress.    Appearance: She is well-developed. She is not ill-appearing.  HENT:     Head: Normocephalic and atraumatic.     Right Ear: Hearing, tympanic membrane, ear canal and external ear normal. No drainage.     Left Ear: Hearing, tympanic membrane, ear  canal and external ear normal. No drainage.     Nose:  Nose normal.     Right Sinus: No maxillary sinus tenderness or frontal sinus tenderness.     Left Sinus: No maxillary sinus tenderness or frontal sinus tenderness.     Mouth/Throat:     Mouth: Mucous membranes are moist.     Pharynx: Oropharynx is clear. Uvula midline. No pharyngeal swelling, oropharyngeal exudate or posterior oropharyngeal erythema.  Eyes:     General: Lids are normal.        Right eye: No discharge.        Left eye: No discharge.     Extraocular Movements: Extraocular movements intact.     Conjunctiva/sclera: Conjunctivae normal.     Pupils: Pupils are equal, round, and reactive to light.     Visual Fields: Right eye visual fields normal and left eye visual fields normal.  Neck:     Thyroid: No thyromegaly.     Vascular: No carotid bruit.     Trachea: Trachea normal.  Cardiovascular:     Rate and Rhythm: Normal rate and regular rhythm.     Heart sounds: Normal heart sounds. No murmur heard.   No gallop.  Pulmonary:     Effort: Pulmonary effort is normal. No accessory muscle usage or respiratory distress.     Breath sounds: Normal breath sounds.  Chest:  Breasts:    Right: Normal.     Left: Normal.  Abdominal:     General: Bowel sounds are normal.     Palpations: Abdomen is soft. There is no hepatomegaly or splenomegaly.     Tenderness: There is no abdominal tenderness.  Genitourinary:    Vagina: Normal.     Cervix: Normal.     Adnexa: Right adnexa normal and left adnexa normal.  Musculoskeletal:        General: Normal range of motion.     Cervical back: Normal range of motion and neck supple.     Right lower leg: No edema.     Left lower leg: No edema.  Lymphadenopathy:     Head:     Right side of head: No submental, submandibular, tonsillar, preauricular or posterior auricular adenopathy.     Left side of head: No submental, submandibular, tonsillar, preauricular or posterior auricular adenopathy.      Cervical: No cervical adenopathy.     Upper Body:     Right upper body: No supraclavicular, axillary or pectoral adenopathy.     Left upper body: No supraclavicular, axillary or pectoral adenopathy.  Skin:    General: Skin is warm and dry.     Capillary Refill: Capillary refill takes less than 2 seconds.     Findings: No rash.  Neurological:     Mental Status: She is alert and oriented to person, place, and time.     Gait: Gait is intact.     Deep Tendon Reflexes: Reflexes are normal and symmetric.     Reflex Scores:      Brachioradialis reflexes are 2+ on the right side and 2+ on the left side.      Patellar reflexes are 2+ on the right side and 2+ on the left side. Psychiatric:        Attention and Perception: Attention normal.        Mood and Affect: Mood normal.        Speech: Speech normal.        Behavior: Behavior normal. Behavior is cooperative.        Thought Content: Thought content normal.  Judgment: Judgment normal.    Results for orders placed or performed in visit on 12/25/20  Comp Met (CMET)  Result Value Ref Range   Glucose 108 (H) 70 - 99 mg/dL   BUN 11 6 - 24 mg/dL   Creatinine, Ser 0.74 0.57 - 1.00 mg/dL   eGFR 93 >59 mL/min/1.73   BUN/Creatinine Ratio 15 9 - 23   Sodium 142 134 - 144 mmol/L   Potassium 4.0 3.5 - 5.2 mmol/L   Chloride 104 96 - 106 mmol/L   CO2 20 20 - 29 mmol/L   Calcium 8.9 8.7 - 10.2 mg/dL   Total Protein 6.1 6.0 - 8.5 g/dL   Albumin 4.0 3.8 - 4.9 g/dL   Globulin, Total 2.1 1.5 - 4.5 g/dL   Albumin/Globulin Ratio 1.9 1.2 - 2.2   Bilirubin Total <0.2 0.0 - 1.2 mg/dL   Alkaline Phosphatase 86 44 - 121 IU/L   AST 17 0 - 40 IU/L   ALT 12 0 - 32 IU/L  Lipid Profile  Result Value Ref Range   Cholesterol, Total 237 (H) 100 - 199 mg/dL   Triglycerides 235 (H) 0 - 149 mg/dL   HDL 33 (L) >39 mg/dL   VLDL Cholesterol Cal 44 (H) 5 - 40 mg/dL   LDL Chol Calc (NIH) 160 (H) 0 - 99 mg/dL   Chol/HDL Ratio 7.2 (H) 0.0 - 4.4 ratio   Hepatitis C Antibody  Result Value Ref Range   Hep C Virus Ab <0.1 0.0 - 0.9 s/co ratio  HIV Antibody (routine testing w rflx)  Result Value Ref Range   HIV Screen 4th Generation wRfx Non Reactive Non Reactive      Assessment & Plan:   Problem List Items Addressed This Visit       Respiratory   Reactive airway disease   Relevant Medications   benzonatate (TESSALON) 200 MG capsule   COPD mixed type (HCC)    Chronic. Cough is exacerbated this time of year. States she has taken Tussinex for a couple of days helps improved her symptoms. Consistently taking inhalers and using Albuterol PRN. Follow up in 3 months. If cough not improved, will send to Pulmonology. Refills of tessalon and inhalers sent today.       Relevant Medications   guaiFENesin-codeine 100-10 MG/5ML syrup   montelukast (SINGULAIR) 10 MG tablet   benzonatate (TESSALON) 200 MG capsule   albuterol (VENTOLIN HFA) 108 (90 Base) MCG/ACT inhaler   budesonide-formoterol (SYMBICORT) 160-4.5 MCG/ACT inhaler   Other Relevant Orders   Pneumococcal polysaccharide vaccine 23-valent greater than or equal to 2yo subcutaneous/IM (Completed)     Other   Panic disorder    Chronic.  Controlled.  Continue with current medication regimen on Seroquel 41m daily.  Refills sent today. Labs ordered today.  Return to clinic in 3 months for reevaluation.  Call sooner if concerns arise.        Relevant Medications   DULoxetine (CYMBALTA) 20 MG capsule   Depression    Chronic.  Controlled.  Continue with current medication regimen on Seroquel 249mdaily. Refills sent today.  Labs ordered today.  Return to clinic in 3 months for reevaluation.  Call sooner if concerns arise.        Relevant Medications   DULoxetine (CYMBALTA) 20 MG capsule   Cigarette smoker    CT lung ordered during visit today. Will make recommendations based on imaging.  Recommend smoking cessation.  Crestor increased to 1023maily.  Medication sent to the  pharmacy.       Other Visit Diagnoses     Annual physical exam    -  Primary   Health maintenance reviewed during visit. Labs ordered today. Pneumovax given during visit. Declines Colonoscopy.   Relevant Orders   CBC with Differential/Platelet   Comprehensive metabolic panel   Lipid panel   TSH   Urinalysis, Routine w reflex microscopic   Current every day smoker       Relevant Orders   CT CHEST LUNG CA SCREEN LOW DOSE W/O CM   Tachycardia       Patient requesting Beta blocker. Discussed wiht patient multiple times that she needs to see Cardiology prior to starting medication.   Relevant Orders   Ambulatory referral to Cardiology   Screening for cervical cancer       Relevant Orders   Cytology - PAP   Encounter for screening mammogram for malignant neoplasm of breast       Relevant Orders   MM Digital Screening        Follow up plan: Return in about 3 months (around 05/15/2021) for HTN, HLD, DM2 FU.   LABORATORY TESTING:  - Pap smear: pap done  IMMUNIZATIONS:   - Tdap: Tetanus vaccination status reviewed: last tetanus booster within 10 years. - Influenza: Up to date - Pneumovax: Administered today - Prevnar: Not applicable - COVID: Refused - HPV: Not applicable - Shingrix vaccine:  Discussed at visit  SCREENING: -Mammogram: Ordered today  - Colonoscopy: Refused  - Bone Density: Not applicable  -Hearing Test: Not applicable  -Spirometry: Not applicable   PATIENT COUNSELING:   Advised to take 1 mg of folate supplement per day if capable of pregnancy.   Sexuality: Discussed sexually transmitted diseases, partner selection, use of condoms, avoidance of unintended pregnancy  and contraceptive alternatives.   Advised to avoid cigarette smoking.  I discussed with the patient that most people either abstain from alcohol or drink within safe limits (<=14/week and <=4 drinks/occasion for males, <=7/weeks and <= 3 drinks/occasion for females) and that the risk for alcohol  disorders and other health effects rises proportionally with the number of drinks per week and how often a drinker exceeds daily limits.  Discussed cessation/primary prevention of drug use and availability of treatment for abuse.   Diet: Encouraged to adjust caloric intake to maintain  or achieve ideal body weight, to reduce intake of dietary saturated fat and total fat, to limit sodium intake by avoiding high sodium foods and not adding table salt, and to maintain adequate dietary potassium and calcium preferably from fresh fruits, vegetables, and low-fat dairy products.    stressed the importance of regular exercise  Injury prevention: Discussed safety belts, safety helmets, smoke detector, smoking near bedding or upholstery.   Dental health: Discussed importance of regular tooth brushing, flossing, and dental visits.    NEXT PREVENTATIVE PHYSICAL DUE IN 1 YEAR. Return in about 3 months (around 05/15/2021) for HTN, HLD, DM2 FU.

## 2021-02-14 NOTE — Telephone Encounter (Signed)
Copied from CRM [id number]. Topic: General - Other >> Feb 14, 2021  3:20 PM Marylen Ponto wrote: Reason for CRM: Pt stated she was told that the Rx for rosuvastatin (CRESTOR) 5 MG tablet would be increased on her next visit but she just had an appt but the Rx is still the same. Pt requests call back. Cb# [phone]

## 2021-02-14 NOTE — Addendum Note (Signed)
Addended by: Larae Grooms on: 02/14/2021 04:19 PM   Modules accepted: Orders

## 2021-02-14 NOTE — Assessment & Plan Note (Addendum)
CT lung ordered during visit today. Will make recommendations based on imaging.  Recommend smoking cessation.  Crestor increased to 10mg  daily.  Medication sent to the pharmacy.

## 2021-02-14 NOTE — Assessment & Plan Note (Addendum)
Chronic.  Controlled.  Continue with current medication regimen on Seroquel 25mg  daily.  Refills sent today. Labs ordered today.  Return to clinic in 3 months for reevaluation.  Call sooner if concerns arise.

## 2021-02-15 DIAGNOSIS — F411 Generalized anxiety disorder: Secondary | ICD-10-CM | POA: Diagnosis not present

## 2021-02-15 DIAGNOSIS — F4310 Post-traumatic stress disorder, unspecified: Secondary | ICD-10-CM | POA: Diagnosis not present

## 2021-02-15 LAB — CBC WITH DIFFERENTIAL/PLATELET
Basophils Absolute: 0 10*3/uL (ref 0.0–0.2)
Basos: 0 %
EOS (ABSOLUTE): 0.1 10*3/uL (ref 0.0–0.4)
Eos: 1 %
Hematocrit: 43 % (ref 34.0–46.6)
Hemoglobin: 14.6 g/dL (ref 11.1–15.9)
Immature Grans (Abs): 0 10*3/uL (ref 0.0–0.1)
Immature Granulocytes: 0 %
Lymphocytes Absolute: 2.4 10*3/uL (ref 0.7–3.1)
Lymphs: 33 %
MCH: 34.7 pg — ABNORMAL HIGH (ref 26.6–33.0)
MCHC: 34 g/dL (ref 31.5–35.7)
MCV: 102 fL — ABNORMAL HIGH (ref 79–97)
Monocytes Absolute: 0.7 10*3/uL (ref 0.1–0.9)
Monocytes: 9 %
Neutrophils Absolute: 4 10*3/uL (ref 1.4–7.0)
Neutrophils: 57 %
Platelets: 207 10*3/uL (ref 150–450)
RBC: 4.21 x10E6/uL (ref 3.77–5.28)
RDW: 12.4 % (ref 11.7–15.4)
WBC: 7.2 10*3/uL (ref 3.4–10.8)

## 2021-02-15 LAB — URINALYSIS, ROUTINE W REFLEX MICROSCOPIC
Bilirubin, UA: NEGATIVE
Glucose, UA: NEGATIVE
Ketones, UA: NEGATIVE
Leukocytes,UA: NEGATIVE
Nitrite, UA: NEGATIVE
Protein,UA: NEGATIVE
RBC, UA: NEGATIVE
Specific Gravity, UA: >=1.03 — AB (ref 1.005–1.030)
Urobilinogen, Ur: 1 mg/dL (ref 0.2–1.0)
pH, UA: 5.5 (ref 5.0–7.5)

## 2021-02-15 LAB — LIPID PANEL
Chol/HDL Ratio: 6.2 ratio — ABNORMAL HIGH (ref 0.0–4.4)
Cholesterol, Total: 137 mg/dL (ref 100–199)
HDL: 22 mg/dL — ABNORMAL LOW (ref >39)
LDL Chol Calc (NIH): 46 mg/dL (ref 0–99)
Triglycerides: 470 mg/dL — ABNORMAL HIGH (ref 0–149)
VLDL Cholesterol Cal: 69 mg/dL — ABNORMAL HIGH (ref 5–40)

## 2021-02-15 LAB — COMPREHENSIVE METABOLIC PANEL
ALT: 12 IU/L (ref 0–32)
AST: 13 IU/L (ref 0–40)
Albumin/Globulin Ratio: 1.8 (ref 1.2–2.2)
Albumin: 3.5 g/dL — ABNORMAL LOW (ref 3.8–4.9)
Alkaline Phosphatase: 84 IU/L (ref 44–121)
BUN/Creatinine Ratio: 27 — ABNORMAL HIGH (ref 9–23)
BUN: 15 mg/dL (ref 6–24)
Bilirubin Total: <0.2 mg/dL (ref 0.0–1.2)
CO2: 18 mmol/L — ABNORMAL LOW (ref 20–29)
Calcium: 8.1 mg/dL — ABNORMAL LOW (ref 8.7–10.2)
Chloride: 113 mmol/L — ABNORMAL HIGH (ref 96–106)
Creatinine, Ser: 0.55 mg/dL — ABNORMAL LOW (ref 0.57–1.00)
Globulin, Total: 1.9 g/dL (ref 1.5–4.5)
Glucose: 95 mg/dL (ref 70–99)
Potassium: 4.4 mmol/L (ref 3.5–5.2)
Sodium: 145 mmol/L — ABNORMAL HIGH (ref 134–144)
Total Protein: 5.4 g/dL — ABNORMAL LOW (ref 6.0–8.5)
eGFR: 106 mL/min/{1.73_m2} (ref >59)

## 2021-02-15 LAB — TSH: TSH: 0.854 u[IU]/mL (ref 0.450–4.500)

## 2021-02-17 NOTE — Progress Notes (Signed)
Please let patient know that overall her lab work looks good.  Her complete blood count is consistent with prior and shows that she has small red blood cells.   Patient's kidney function has declined some.  I recommend she drink 64 ounces of water daily and I would like her to come back next week and repeat the blood work.  Her cholesterol remains elevated.  She needs to decrease processed foods and refined sugar intake. We will recheck this in 3-6 months.  Please make her a lab appointment to come back and have her kidney function rechecked.

## 2021-02-17 NOTE — Addendum Note (Signed)
Addended by: Larae Grooms on: 02/17/2021 12:51 PM   Modules accepted: Orders

## 2021-02-18 ENCOUNTER — Encounter: Payer: Self-pay | Admitting: Nurse Practitioner

## 2021-02-18 LAB — CYTOLOGY - PAP
Adequacy: ABSENT
Diagnosis: NEGATIVE

## 2021-02-18 NOTE — Progress Notes (Signed)
Hi Karen Foster.  Your pap was normal.  We will repeat it in 5 years.

## 2021-02-26 ENCOUNTER — Other Ambulatory Visit: Payer: Self-pay | Admitting: *Deleted

## 2021-02-26 ENCOUNTER — Other Ambulatory Visit: Payer: Self-pay

## 2021-02-26 NOTE — Patient Instructions (Signed)
Visit Information  Karen Foster was given information about Medicaid Managed Care team care coordination services as a part of their [facility] Medicaid benefit. Karen Foster verbally consented to engagement with the [facility] Managed Care team.   If you are experiencing a medical emergency, please call 911 or report to your local emergency department or urgent care.   If you have a non-emergency medical problem during routine business hours, please contact your provider's office and ask to speak with a nurse.   For questions related to your [facility] health plan, please call: [phone] or go here:[email or web address]  If you would like to schedule transportation through your [facility] plan, please call the following number at least 2 days in advance of your appointment: [phone].  Call the [facility] Crisis Line at [phone], at any time, 24 hours a day, 7 days a week. If you are in danger or need immediate medical attention call 911.  If you would like help to quit smoking, call 1-800-QUIT-NOW ((610) 505-7434) OR Espaol: 1-855-Djelo-Ya (7-353-299-2426) o para ms informacin haga clic aqu or Text READY to 834-196 to register via text  Karen Foster - following are the goals we discussed in your visit today:   Goals Addressed   None     Please see education materials related to smoking cessation and hyperlipidemia provided by MyChart link.  Patient has access to MyChart and can view provided education  Telephone follow up appointment with Managed Medicaid care management team member scheduled for:03/28/21 @ 3:30pm  Estanislado Emms RN, BSN [place]   [facility] RN Care Coordinator   Following is a copy of your plan of care:  Care Plan : RN Care Manager Plan of Care  Updates made by Heidi Dach, RN since 02/26/2021 12:00 AM     Problem: Chronic health managment needs related to COPD      Long-Range Goal: Development of Plan  of Care to address health managment needs related to COPD   Start Date: 01/27/2021  Expected End Date: 04/27/2021  Priority: High  Note:   Current Barriers:  Chronic Disease Management support and education needs related to HLD-Patient had follow up with PCP on 02/14/21. Lipid panel continues to be abnormal. Karen Foster admits to drinking Pepsi all day and smokes 1/2 pack a day. She denies fast food and prepares most of her meals.  RNCM Clinical Goal(s):  Patient will verbalize understanding of plan for management of HLD as evidenced by patient verbalization and self monitoring activity take all medications exactly as prescribed and will call provider for medication related questions as evidenced by documentation in EMR    attend all scheduled medical appointments: 03/13/21 for mammogram and 03/14/21 with Cardiology as evidenced by provider documentation in EMR        continue to work with RN Care Manager and/or Social Worker to address care management and care coordination needs related to HLD as evidenced by adherence to CM Team Scheduled appointments     through collaboration with Medical illustrator, provider, and care team.   Interventions: Inter-disciplinary care team collaboration (see longitudinal plan of care) Evaluation of current treatment plan related to  self management and patient's adherence to plan as established by provider Advised patient to follow up with scheduling for CT for Lung cancer screening Provided therapeutic listening   Hyperlipidemia:  (Status: Goal on Track (progressing): YES.) Long Term Goal  Lab Results  Component Value Date   CHOL 137 02/14/2021   HDL 22 (  L) 02/14/2021   LDLCALC 46 02/14/2021   TRIG 470 (H) 02/14/2021   CHOLHDL 6.2 (H) 02/14/2021     Medication review performed; medication list updated in electronic medical record.  Counseled on importance of regular laboratory monitoring as prescribed; Provided HLD educational materials; Reviewed  importance of limiting foods high in cholesterol; Reviewed exercise goals and target of 150 minutes per week; Assessed social determinant of health barriers;  Discussed smoking cessation-patient is interested, and feels like she can work on decreasing the number of cigarettes/day Advised patient to cut back on drinking Pepsi and increase her water intake  Patient Goals/Self-Care Activities: Take medications as prescribed   Attend all scheduled provider appointments Call pharmacy for medication refills 3-7 days in advance of running out of medications Attend church or other social activities Perform all self care activities independently  Call provider office for new concerns or questions  - adhere to prescribed diet: heart healthy - develop an exercise routine

## 2021-02-26 NOTE — Patient Outreach (Signed)
Medicaid Managed Care   Nurse Care Manager Note  02/26/2021 Name:  Karen Foster MRN:  [id number] DOB:  1960/09/21  Karen Foster is an 60 y.o. year old female who is a primary patient of Karen Grooms, NP.  The [facility] Managed Care Coordination team was consulted for assistance with:    HLD  Karen Foster was given information about Medicaid Managed Care Coordination team services today. Karen Foster Patient agreed to services and verbal consent obtained.  Engaged with patient by telephone for follow up visit in response to provider referral for case management and/or care coordination services.   Assessments/Interventions:  Review of past medical history, allergies, medications, health status, including review of consultants reports, laboratory and other test data, was performed as part of comprehensive evaluation and provision of chronic care management services.  SDOH (Social Determinants of Health) assessments and interventions performed: SDOH Interventions    Flowsheet Row Most Recent Value  SDOH Interventions   Housing Interventions Intervention Not Indicated  Transportation Interventions Intervention Not Indicated  [Patient aware and using transportation resources.]       Care Plan  Allergies  Allergen Reactions   Ibuprofen Anaphylaxis   Bee Venom    Hydroxyzine Nausea Only   Sulfa Antibiotics Other (See Comments)   Tomato     Medications Reviewed Today     Reviewed by Karen Dach, RN (Registered Nurse) on 02/26/21 at 1456  Med List Status: <None>   Medication Order Taking? Sig Documenting Provider Last Dose Status Informant  albuterol (VENTOLIN HFA) 108 (90 Base) MCG/ACT inhaler [id number] Yes Inhale 2 puffs into the lungs every 6 (six) hours as needed for wheezing or shortness of breath. Karen Grooms, NP Taking Active   benzonatate (TESSALON) 200 MG capsule [id number] Yes Take 1 capsule (200 mg total) by mouth 3 (three) times daily as needed for cough.  Karen Grooms, NP Taking Active   budesonide-formoterol [facility]) 160-4.5 MCG/ACT inhaler [id number] Yes Inhale 2 puffs into the lungs 2 (two) times daily. Karen Grooms, NP Taking Active   cyclobenzaprine (FLEXERIL) 5 MG tablet [id number] Yes Take 1 tablet (5 mg total) by mouth 3 (three) times daily as needed for muscle spasms. Karen Grooms, NP Taking Active   diclofenac sodium (VOLTAREN) 1 % GEL [id number] Yes Apply 2 g topically 4 (four) times daily as needed. Particia Nearing, [place] Taking Active   DULoxetine (CYMBALTA) 20 MG capsule [id number] Yes Take 1 capsule (20 mg total) by mouth daily. Karen Grooms, NP Taking Active   gabapentin (NEURONTIN) 300 MG capsule [id number] Yes Take 1 capsule (300 mg total) by mouth 4 (four) times daily. Karen Grooms, NP Taking Active   guaiFENesin-codeine 100-10 MG/5ML syrup [id number] Yes Take 5 mLs by mouth 2 (two) times daily as needed for cough. Karen Grooms, NP Taking Active   montelukast (SINGULAIR) 10 MG tablet [id number] Yes Take 1 tablet (10 mg total) by mouth at bedtime. Karen Grooms, NP Taking Active   pantoprazole (PROTONIX) 20 MG tablet [id number] Yes Take 1 tablet (20 mg total) by mouth daily. Karen Grooms, NP Taking Active   QUEtiapine (SEROQUEL) 25 MG tablet [id number] Yes Take 1 tab in the morning and 2 tabs at bedtime. Can increase to 3 tabs at bedtime after 1 week. Karen Grooms, NP Taking Active   rosuvastatin (CRESTOR) 10 MG tablet [id number] Yes Take 1 tablet (10 mg total) by mouth daily. Karen Grooms, NP Taking Active  Patient Active Problem List   Diagnosis Date Noted   Other chronic pain 12/25/2020   Allergic rhinitis 05/26/2019   Urticaria 12/01/2018   COPD mixed type (HCC) 12/01/2018   Cigarette smoker 06/08/2018   Arthritis 02/19/2018   Hemorrhoids 02/18/2018   Reactive airway disease 02/18/2018   Migraines 12/12/2017   Deviated septum 12/03/2017   Heart murmur  12/03/2017   Depression 12/03/2017   Panic disorder     Conditions to be addressed/monitored per PCP order:  HLD  Care Plan : RN Care Manager Plan of Care  Updates made by Karen Dach, RN since 02/26/2021 12:00 AM     Problem: Chronic health managment needs related to COPD      Long-Range Goal: Development of Plan of Care to address health managment needs related to COPD   Start Date: 01/27/2021  Expected End Date: 04/27/2021  Priority: High  Note:   Current Barriers:  Chronic Disease Management support and education needs related to HLD-Patient had follow up with PCP on 02/14/21. Lipid panel continues to be abnormal. Karen Foster admits to drinking Pepsi all day and smokes 1/2 pack a day. She denies fast food and prepares most of her meals.  RNCM Clinical Goal(s):  Patient will verbalize understanding of plan for management of HLD as evidenced by patient verbalization and self monitoring activity take all medications exactly as prescribed and will call provider for medication related questions as evidenced by documentation in EMR    attend all scheduled medical appointments: 03/13/21 for mammogram and 03/14/21 with Cardiology as evidenced by provider documentation in EMR        continue to work with RN Care Manager and/or Social Worker to address care management and care coordination needs related to HLD as evidenced by adherence to CM Team Scheduled appointments     through collaboration with Medical illustrator, provider, and care team.   Interventions: Inter-disciplinary care team collaboration (see longitudinal plan of care) Evaluation of current treatment plan related to  self management and patient's adherence to plan as established by provider Advised patient to follow up with scheduling for CT for Lung cancer screening Provided therapeutic listening   Hyperlipidemia:  (Status: Goal on Track (progressing): YES.) Long Term Goal  Lab Results  Component Value Date   CHOL 137  02/14/2021   HDL 22 (L) 02/14/2021   LDLCALC 46 02/14/2021   TRIG 470 (H) 02/14/2021   CHOLHDL 6.2 (H) 02/14/2021     Medication review performed; medication list updated in electronic medical record.  Counseled on importance of regular laboratory monitoring as prescribed; Provided HLD educational materials; Reviewed importance of limiting foods high in cholesterol; Reviewed exercise goals and target of 150 minutes per week; Assessed social determinant of health barriers;  Discussed smoking cessation-patient is interested, and feels like she can work on decreasing the number of cigarettes/day Advised patient to cut back on drinking Pepsi and increase her water intake  Patient Goals/Self-Care Activities: Take medications as prescribed   Attend all scheduled provider appointments Call pharmacy for medication refills 3-7 days in advance of running out of medications Attend church or other social activities Perform all self care activities independently  Call provider office for new concerns or questions  - adhere to prescribed diet: heart healthy - develop an exercise routine       Follow Up:  Patient agrees to Care Plan and Follow-up.  Plan: The Managed Medicaid care management team will reach out to the patient again over the next  30 days.  Date/time of next scheduled RN care management/care coordination outreach:  03/28/21 @ 3:30pm  Estanislado Emms RN, BSN [place]   [facility] RN Care Coordinator

## 2021-02-27 ENCOUNTER — Ambulatory Visit: Payer: Self-pay | Admitting: *Deleted

## 2021-02-27 NOTE — Telephone Encounter (Signed)
Pt states she has a flip phone so I was unable to schedule MyChart but I scheduled a telephone visit for tomorrow at 11:20

## 2021-02-27 NOTE — Progress Notes (Signed)
BP 133/70 (BP Location: Right Arm, Patient Position: Sitting, Cuff Size: Normal)    Pulse 83    Temp 97.8 F (36.6 C) (Oral)    Ht 5' 4.7" (1.643 m)    Wt 159 lb (72.1 kg)    BMI 26.70 kg/m    Subjective:    Patient ID: Karen Foster, female    DOB: 11-27-1960, 60 y.o.   MRN: [id number]  HPI: Karen Foster is a 60 y.o. female  Chief Complaint  Patient presents with   Mouth Lesions    X5 days; oral thrush due to improper inhaler use; 5/10 pain   MOUTH SORES Patient states she has been having white stuff in her mouth.  Does hurt when she eats and drinks.  She does not always rinse her mouth out after she uses her inhaler.   Relevant past medical, surgical, family and social history reviewed and updated as indicated. Interim medical history since our last visit reviewed. Allergies and medications reviewed and updated.  Review of Systems  HENT:  Positive for mouth sores.    Per HPI unless specifically indicated above     Objective:    BP 133/70 (BP Location: Right Arm, Patient Position: Sitting, Cuff Size: Normal)    Pulse 83    Temp 97.8 F (36.6 C) (Oral)    Ht 5' 4.7" (1.643 m)    Wt 159 lb (72.1 kg)    BMI 26.70 kg/m   Wt Readings from Last 3 Encounters:  02/28/21 159 lb (72.1 kg)  02/14/21 151 lb 9.6 oz (68.8 kg)  12/25/20 135 lb 3.2 oz (61.3 kg)    Physical Exam Vitals and nursing note reviewed.  Pulmonary:     Effort: Pulmonary effort is normal. No respiratory distress.  Neurological:     Mental Status: She is alert.  Psychiatric:        Mood and Affect: Mood normal.        Behavior: Behavior normal.        Thought Content: Thought content normal.        Judgment: Judgment normal.    Results for orders placed or performed in visit on 02/14/21  CBC with Differential/Platelet  Result Value Ref Range   WBC 7.2 3.4 - 10.8 x10E3/uL   RBC 4.21 3.77 - 5.28 x10E6/uL   Hemoglobin 14.6 11.1 - 15.9 g/dL   Hematocrit 43.0 34.0 - 46.6 %   MCV 102 (H) 79 - 97 fL   MCH  34.7 (H) 26.6 - 33.0 pg   MCHC 34.0 31.5 - 35.7 g/dL   RDW 12.4 11.7 - 15.4 %   Platelets 207 150 - 450 x10E3/uL   Neutrophils 57 Not Estab. %   Lymphs 33 Not Estab. %   Monocytes 9 Not Estab. %   Eos 1 Not Estab. %   Basos 0 Not Estab. %   Neutrophils Absolute 4.0 1.4 - 7.0 x10E3/uL   Lymphocytes Absolute 2.4 0.7 - 3.1 x10E3/uL   Monocytes Absolute 0.7 0.1 - 0.9 x10E3/uL   EOS (ABSOLUTE) 0.1 0.0 - 0.4 x10E3/uL   Basophils Absolute 0.0 0.0 - 0.2 x10E3/uL   Immature Granulocytes 0 Not Estab. %   Immature Grans (Abs) 0.0 0.0 - 0.1 x10E3/uL  Comprehensive metabolic panel  Result Value Ref Range   Glucose 95 70 - 99 mg/dL   BUN 15 6 - 24 mg/dL   Creatinine, Ser 0.55 (L) 0.57 - 1.00 mg/dL   eGFR 106 >59 mL/min/1.73  BUN/Creatinine Ratio 27 (H) 9 - 23   Sodium 145 (H) 134 - 144 mmol/L   Potassium 4.4 3.5 - 5.2 mmol/L   Chloride 113 (H) 96 - 106 mmol/L   CO2 18 (L) 20 - 29 mmol/L   Calcium 8.1 (L) 8.7 - 10.2 mg/dL   Total Protein 5.4 (L) 6.0 - 8.5 g/dL   Albumin 3.5 (L) 3.8 - 4.9 g/dL   Globulin, Total 1.9 1.5 - 4.5 g/dL   Albumin/Globulin Ratio 1.8 1.2 - 2.2   Bilirubin Total <0.2 0.0 - 1.2 mg/dL   Alkaline Phosphatase 84 44 - 121 IU/L   AST 13 0 - 40 IU/L   ALT 12 0 - 32 IU/L  Lipid panel  Result Value Ref Range   Cholesterol, Total 137 100 - 199 mg/dL   Triglycerides 470 (H) 0 - 149 mg/dL   HDL 22 (L) >39 mg/dL   VLDL Cholesterol Cal 69 (H) 5 - 40 mg/dL   LDL Chol Calc (NIH) 46 0 - 99 mg/dL   Chol/HDL Ratio 6.2 (H) 0.0 - 4.4 ratio  TSH  Result Value Ref Range   TSH 0.854 0.450 - 4.500 uIU/mL  Urinalysis, Routine w reflex microscopic  Result Value Ref Range   Specific Gravity, UA      >=1.030 (A) 1.005 - 1.030   pH, UA 5.5 5.0 - 7.5   Color, UA Yellow Yellow   Appearance Ur Clear Clear   Leukocytes,UA Negative Negative   Protein,UA Negative Negative/Trace   Glucose, UA Negative Negative   Ketones, UA Negative Negative   RBC, UA Negative Negative   Bilirubin, UA  Negative Negative   Urobilinogen, Ur 1.0 0.2 - 1.0 mg/dL   Nitrite, UA Negative Negative   Microscopic Examination Comment   Cytology - PAP  Result Value Ref Range   Adequacy      Satisfactory for evaluation; transformation zone component ABSENT.   Diagnosis      - Negative for intraepithelial lesion or malignancy (NILM)      Assessment & Plan:   Problem List Items Addressed This Visit   None Visit Diagnoses     Thrush    -  Primary   Use Magic mouth wash with Nystatin. Swish and Swallow. Advised about benadryl in magic mouth wash. Follow up if symptoms do not improve.   Relevant Medications   magic mouthwash (nystatin, lidocaine, diphenhydrAMINE, alum & mag hydroxide) suspension        Follow up plan: No follow-ups on file.   This visit was completed via MyChart due to the restrictions of the COVID-19 pandemic. All issues as above were discussed and addressed. Physical exam was done as above through visual confirmation on MyChart. If it was felt that the patient should be evaluated in the office, they were directed there. The patient verbally consented to this visit. Location of the patient: Home Location of the provider: Office Those involved with this call:  Provider: Jon Billings, NP CMA: Doreene Adas, CMA Front Desk/Registration: Tomie China This encounter was conducted via video.  I spent 15 dedicated to the care of this patient on the date of this encounter to include previsit review of 21, face to face time with the patient, and post visit ordering of testing.

## 2021-02-27 NOTE — Telephone Encounter (Signed)
The patient shares that they're experiencing soreness in their mouth for roughly a week   The patient shares that they've been using their inhaler and haven't been able to wash their mouth out at times   The patient would like to be prescribed magic mouthwash to help with their discomfort    Chief Complaint: White coating inner lip, little on tongue Symptoms: Sore inner lip Frequency: 1 week ago Pertinent Negatives: Patient denies lesions, ulcers Disposition: [] ED /[] Urgent Care (no appt availability in office) / [] Appointment(In office/virtual)/ []  [facility]/ [x] Home Care/ [] Refused Recommended Disposition  Additional Notes: States has had in past and used MMW. Requesting MMW be called in. States "I haven't been rinsing my mouth after using inhalers.          Reason for Disposition  [1] White patches that stick to tongue or inner cheek AND [2] can be wiped off  Answer Assessment - Initial Assessment Questions 1. SYMPTOM: "What's the main symptom you're concerned about?" (e.g., chapped lips, dry mouth, lump, sores)     Mouth sore "Thrush" 2. ONSET: "When did the  start?"     1 week ago 3. PAIN: "Is there any pain?" If Yes, ask: "How bad is it?" (Scale: 1-10; mild, moderate, severe)   - MILD (1-3):  doesn't interfere with eating or normal activities   - MODERATE (4-7): interferes with eating some solids and normal activities   - SEVERE (8-10):  excruciating pain, interferes with most normal activities   - SEVERE DYSPHAGIA: can't swallow liquids, drooling     No 4. CAUSE: "What do you think is causing the symptoms?"     Not rinsing after inhalers 5. OTHER SYMPTOMS: "Do you have any other symptoms?" (e.g., fever, sore throat, toothache, swelling)     Mild sore throat, white on tongue a little inside of lip  Protocols used: Mouth Symptoms-A-AH

## 2021-02-28 ENCOUNTER — Telehealth: Payer: Self-pay | Admitting: Nurse Practitioner

## 2021-02-28 ENCOUNTER — Other Ambulatory Visit: Payer: Self-pay

## 2021-02-28 ENCOUNTER — Ambulatory Visit (INDEPENDENT_AMBULATORY_CARE_PROVIDER_SITE_OTHER): Payer: Medicaid Other | Admitting: Nurse Practitioner

## 2021-02-28 ENCOUNTER — Encounter: Payer: Self-pay | Admitting: Nurse Practitioner

## 2021-02-28 VITALS — BP 133/70 | HR 83 | Temp 97.8°F | Ht 64.7 in | Wt 159.0 lb

## 2021-02-28 DIAGNOSIS — B370 Candidal stomatitis: Secondary | ICD-10-CM | POA: Diagnosis not present

## 2021-02-28 MED ORDER — NYSTATIN 100000 UNIT/ML MT SUSP: 5.0000 mL | Freq: Three times a day (TID) | OROMUCOSAL | 0 refills | Status: DC

## 2021-02-28 MED ORDER — PANTOPRAZOLE SODIUM 20 MG PO TBEC: 20.0000 mg | DELAYED_RELEASE_TABLET | Freq: Two times a day (BID) | ORAL | 1 refills | Status: DC

## 2021-02-28 NOTE — Telephone Encounter (Signed)
Patient called in asking for biotin  mouthwash instead of magic mouthwash, because insurance wont cover it.

## 2021-02-28 NOTE — Telephone Encounter (Signed)
Patient called back again, to correct some info, she now states the meds should be nystatin and lodicane oral. Sent to  [facility] [address]), [place] - [address] Phone:  [phone]  Fax:  [phone]

## 2021-02-28 NOTE — Telephone Encounter (Signed)
Medication ordered today by provider to [facility].

## 2021-03-07 ENCOUNTER — Other Ambulatory Visit: Payer: Self-pay | Admitting: Nurse Practitioner

## 2021-03-07 ENCOUNTER — Telehealth: Payer: Self-pay | Admitting: Nurse Practitioner

## 2021-03-07 MED ORDER — GUAIFENESIN-CODEINE 100-10 MG/5ML PO SOLN: 5.0000 mL | Freq: Two times a day (BID) | ORAL | 0 refills | Status: DC | PRN

## 2021-03-07 NOTE — Telephone Encounter (Signed)
Copied from [place] [id number]. Topic: General - Other >> Mar 07, 2021 10:20 AM Yvette Rack wrote: Reason for CRM: Pt reports that the Rx refill for guaiFENesin-codeine 100-10 MG/5ML syrup was requested because of the cough and congestion due to the change in weather throughout the day and she was only given 6 days worth of the medication. Pt stated she only used it as needed so she had it for about 2 weeks.

## 2021-03-07 NOTE — Telephone Encounter (Signed)
Routing to provider to advise if refill is appropriate.

## 2021-03-07 NOTE — Telephone Encounter (Signed)
Please let patient know that I sent in the refill.  Let her know that I will not be able to give her anymore refills.

## 2021-03-07 NOTE — Telephone Encounter (Signed)
Requested medication (s) are due for refill today: no  Requested medication (s) are on the active medication list: yes  Last refill:  02/14/21 #118/0  Future visit scheduled: yes  Notes to clinic:  Unable to refill per protocol, medication not assigned to the refill protocol.      Requested Prescriptions  Pending Prescriptions Disp Refills   guaiFENesin-codeine 100-10 MG/5ML syrup [Pharmacy Med Name: guaiFENesin-Codeine 100-10 MG/5ML Oral Solution] 118 mL 0    Sig: TAKE 5 ML BY MOUTH  TWICE DAILY AS NEEDED FOR COUGH     Off-Protocol Failed - 03/07/2021  8:19 AM      Failed - Medication not assigned to a protocol, review manually.      Passed - Valid encounter within last 12 months    Recent Outpatient Visits           1 week ago [facility] Larae Grooms, NP   3 weeks ago Annual physical exam   [facility] Larae Grooms, NP   2 months ago COPD mixed type [facility])   [facility] Larae Grooms, NP   3 months ago COPD mixed type [facility])   [facility] Larae Grooms, NP   1 year ago Cigarette smoker   [facility] [place], [place], [place]       Future Appointments             In 1 week Debbe Odea, MD [facility]   In 2 months Larae Grooms, NP [facility], [facility]

## 2021-03-07 NOTE — Telephone Encounter (Signed)
Left vm for patient stating that refill was sent in and that provider will not  be able to send in any further refills

## 2021-03-14 ENCOUNTER — Ambulatory Visit: Payer: Medicaid Other | Admitting: Cardiology

## 2021-03-17 ENCOUNTER — Encounter: Payer: Self-pay | Admitting: Cardiology

## 2021-03-28 ENCOUNTER — Other Ambulatory Visit: Payer: Self-pay | Admitting: *Deleted

## 2021-03-28 ENCOUNTER — Other Ambulatory Visit: Payer: Self-pay

## 2021-03-28 NOTE — Patient Outreach (Signed)
Medicaid Managed Care   Nurse Care Manager Note  03/28/2021 Name:  Karen Foster MRN:  [id number] DOB:  1960-05-10  Karen Foster is an 61 y.o. year old female who is a primary patient of Larae Grooms, NP.  The [facility] Managed Care Coordination team was consulted for assistance with:    HLD  Karen Foster was given information about Medicaid Managed Care Coordination team services today. Karen Foster Patient agreed to services and verbal consent obtained.  Engaged with patient by telephone for follow up visit in response to provider referral for case management and/or care coordination services.   Assessments/Interventions:  Review of past medical history, allergies, medications, health status, including review of consultants reports, laboratory and other test data, was performed as part of comprehensive evaluation and provision of chronic care management services.  SDOH (Social Determinants of Health) assessments and interventions performed: SDOH Interventions    Flowsheet Row Most Recent Value  SDOH Interventions   Food Insecurity Interventions Intervention Not Indicated       Care Plan  Allergies  Allergen Reactions   Bee Venom Anaphylaxis   Ibuprofen Anaphylaxis   Sulfa Antibiotics Anaphylaxis   Tomato Anaphylaxis and Nausea And Vomiting   Hydroxyzine Nausea Only    Medications Reviewed Today     Reviewed by Heidi Dach, RN (Registered Nurse) on 03/28/21 at 1549  Med List Status: <None>   Medication Order Taking? Sig Documenting Provider Last Dose Status Informant  albuterol (VENTOLIN HFA) 108 (90 Base) MCG/ACT inhaler [id number] Yes Inhale 2 puffs into the lungs every 6 (six) hours as needed for wheezing or shortness of breath. Larae Grooms, NP Taking Active   benzonatate (TESSALON) 200 MG capsule [id number] Yes Take 1 capsule (200 mg total) by mouth 3 (three) times daily as needed for cough. Larae Grooms, NP Taking Active   budesonide-formoterol  [facility]) 160-4.5 MCG/ACT inhaler [id number] Yes Inhale 2 puffs into the lungs 2 (two) times daily. Larae Grooms, NP Taking Active   cyclobenzaprine (FLEXERIL) 5 MG tablet [id number] Yes Take 1 tablet (5 mg total) by mouth 3 (three) times daily as needed for muscle spasms. Larae Grooms, NP Taking Active   diclofenac sodium (VOLTAREN) 1 % GEL [id number] Yes Apply 2 g topically 4 (four) times daily as needed. Particia Nearing, [place] Taking Active   DULoxetine (CYMBALTA) 20 MG capsule [id number] Yes Take 1 capsule (20 mg total) by mouth daily. Larae Grooms, NP Taking Active   gabapentin (NEURONTIN) 300 MG capsule [id number] Yes Take 1 capsule (300 mg total) by mouth 4 (four) times daily. Larae Grooms, NP Taking Active   guaiFENesin-codeine 100-10 MG/5ML syrup [id number] No Take 5 mLs by mouth 2 (two) times daily as needed for cough.  Patient not taking: Reported on 03/28/2021   Larae Grooms, NP Not Taking Active   magic mouthwash (nystatin, lidocaine, diphenhydrAMINE, alum & mag hydroxide) suspension [id number] No Swish and swallow 5 mLs 3 (three) times daily.  Patient not taking: Reported on 03/28/2021   Larae Grooms, NP Not Taking Active   montelukast (SINGULAIR) 10 MG tablet [id number] Yes Take 1 tablet (10 mg total) by mouth at bedtime. Larae Grooms, NP Taking Active   pantoprazole (PROTONIX) 20 MG tablet [id number] Yes Take 1 tablet (20 mg total) by mouth 2 (two) times daily. Larae Grooms, NP Taking Active   QUEtiapine (SEROQUEL) 25 MG tablet [id number] Yes Take 1 tab in the morning and 2 tabs at bedtime. Can increase to 3 tabs at bedtime after 1  week. Larae Grooms, NP Taking Active   rosuvastatin (CRESTOR) 10 MG tablet [id number] Yes Take 1 tablet (10 mg total) by mouth daily. Larae Grooms, NP Taking Active             Patient Active Problem List   Diagnosis Date Noted   Other chronic pain 12/25/2020   Allergic rhinitis 05/26/2019   Urticaria  12/01/2018   COPD mixed type (HCC) 12/01/2018   Cigarette smoker 06/08/2018   Arthritis 02/19/2018   Hemorrhoids 02/18/2018   Reactive airway disease 02/18/2018   Migraines 12/12/2017   Deviated septum 12/03/2017   Heart murmur 12/03/2017   Depression 12/03/2017   Panic disorder     Conditions to be addressed/monitored per PCP order:  HLD  Care Plan : RN Care Manager Plan of Care  Updates made by Heidi Dach, RN since 03/28/2021 12:00 AM     Problem: Chronic health managment needs related to COPD      Long-Range Goal: Development of Plan of Care to address health managment needs related to COPD   Start Date: 01/27/2021  Expected End Date: 04/27/2021  Priority: High  Note:   Current Barriers:  Chronic Disease Management support and education needs related to HLD Karen Foster was unable to keep her appointment with Cardiology and for Mammography. She needs to reschedule the missed appointments and plans to do so on Monday. Karen Foster is concerned that she continues to gain weight, now weighing 159 pounds.  RNCM Clinical Goal(s):  Patient will verbalize understanding of plan for management of HLD as evidenced by patient verbalization and self monitoring activity take all medications exactly as prescribed and will call provider for medication related questions as evidenced by documentation in EMR    attend all scheduled medical appointments: rescheduling mammogram and Cardiology as evidenced by provider documentation in EMR        continue to work with RN Care Manager and/or Social Worker to address care management and care coordination needs related to HLD as evidenced by adherence to CM Team Scheduled appointments     through collaboration with RN Care manager, provider, and care team.   Interventions: Inter-disciplinary care team collaboration (see longitudinal plan of care) Evaluation of current treatment plan related to  self management and patient's adherence to plan as  established by provider Advised patient to follow up with scheduling for CT for Lung cancer screening Advised patient to utilize medical transportation provided by [facility], [employer] [phone]-must call 2-3 days before your appointment and provide the address and phone number to the appointment. Discussed the importance in attending scheduled appointments and rescheduling missed appointments Provided education on using inhalers   Hyperlipidemia:  (Status: Goal on Track (progressing): YES.) Long Term Goal  Lab Results  Component Value Date   CHOL 137 02/14/2021   HDL 22 (L) 02/14/2021   LDLCALC 46 02/14/2021   TRIG 470 (H) 02/14/2021   CHOLHDL 6.2 (H) 02/14/2021     Medication review performed; medication list updated in electronic medical record.  Counseled on importance of regular laboratory monitoring as prescribed; Provided HLD educational materials; Reviewed exercise goals and target of 150 minutes per week; Assessed social determinant of health barriers;  Advised patient to cut back on drinking Pepsi and increase her water intake Encouraged patient to discuss any concerns or questions with provider  Patient Goals/Self-Care Activities: Take medications as prescribed   Attend all scheduled provider appointments Call pharmacy for medication refills 3-7 days in advance of running out of medications  Attend church or other social activities Perform all self care activities independently  Call provider office for new concerns or questions  - adhere to prescribed diet: heart healthy - develop an exercise routine       Follow Up:  Patient agrees to Care Plan and Follow-up.  Plan: The Managed Medicaid care management team will reach out to the patient again over the next 30 days.  Date/time of next scheduled RN care management/care coordination outreach:  04/29/21 @ 3:30pm  Estanislado EmmsMelanie Soyla Bainter RN, BSN [place]   [facility] RN Care Coordinator

## 2021-03-28 NOTE — Patient Instructions (Signed)
Visit Information  Karen Foster was given information about Medicaid Managed Care team care coordination services as a part of their [facility] Medicaid benefit. Karen Foster verbally consented to engagement with the [facility] Managed Care team.   If you are experiencing a medical emergency, please call 911 or report to your local emergency department or urgent care.   If you have a non-emergency medical problem during routine business hours, please contact your provider's office and ask to speak with a nurse.   For questions related to your [facility] health plan, please call: [phone] or go here:[email or web address]  If you would like to schedule transportation through your [facility] plan, please call the following number at least 2 days in advance of your appointment: [phone].  Call the [facility] Crisis Line at [phone], at any time, 24 hours a day, 7 days a week. If you are in danger or need immediate medical attention call 911.  If you would like help to quit smoking, call 1-800-QUIT-NOW ((412)665-0388) OR Espaol: 1-855-Djelo-Ya (4-235-361-4431) o para ms informacin haga clic aqu or Text READY to 540-086 to register via text  Karen Foster,   Please see education materials related to using inhalers provided as print materials.   The patient verbalized understanding of instructions provided today and agreed to receive a mailed copy of patient instruction and/or educational materials.  Telephone follow up appointment with Managed Medicaid care management team member scheduled for:04/29/21 @ 3:30pm  Karen Emms RN, BSN [name]   [facility] RN Care Coordinator   Following is a copy of your plan of care:  Care Plan : RN Care Manager Plan of Care  Updates made by Heidi Dach, RN since 03/28/2021 12:00 AM     Problem: Chronic health managment needs related to COPD      Long-Range Goal: Development of Plan of Care to  address health managment needs related to COPD   Start Date: 01/27/2021  Expected End Date: 04/27/2021  Priority: High  Note:   Current Barriers:  Chronic Disease Management support and education needs related to HLD Karen Foster was unable to keep her appointment with Cardiology and for Mammography. She needs to reschedule the missed appointments and plans to do so on Monday. Karen Foster is concerned that she continues to gain weight, now weighing 159 pounds.  RNCM Clinical Goal(s):  Patient will verbalize understanding of plan for management of HLD as evidenced by patient verbalization and self monitoring activity take all medications exactly as prescribed and will call provider for medication related questions as evidenced by documentation in EMR    attend all scheduled medical appointments: rescheduling mammogram and Cardiology as evidenced by provider documentation in EMR        continue to work with RN Care Manager and/or Social Worker to address care management and care coordination needs related to HLD as evidenced by adherence to CM Team Scheduled appointments     through collaboration with RN Care manager, provider, and care team.   Interventions: Inter-disciplinary care team collaboration (see longitudinal plan of care) Evaluation of current treatment plan related to  self management and patient's adherence to plan as established by provider Advised patient to follow up with scheduling for CT for Lung cancer screening Advised patient to utilize medical transportation provided by [facility], One [employer] [phone] call 2-3 days before your appointment and provide the address and phone number to the appointment. Discussed the importance in attending scheduled appointments and rescheduling missed appointments Provided education  on using inhalers   Hyperlipidemia:  (Status: Goal on Track (progressing): YES.) Long Term Goal  Lab Results  Component Value Date   CHOL 137 02/14/2021    HDL 22 (L) 02/14/2021   LDLCALC 46 02/14/2021   TRIG 470 (H) 02/14/2021   CHOLHDL 6.2 (H) 02/14/2021     Medication review performed; medication list updated in electronic medical record.  Counseled on importance of regular laboratory monitoring as prescribed; Provided HLD educational materials; Reviewed exercise goals and target of 150 minutes per week; Assessed social determinant of health barriers;  Advised patient to cut back on drinking Pepsi and increase her water intake Encouraged patient to discuss any concerns or questions with provider  Patient Goals/Self-Care Activities: Take medications as prescribed   Attend all scheduled provider appointments Call pharmacy for medication refills 3-7 days in advance of running out of medications Attend church or other social activities Perform all self care activities independently  Call provider office for new concerns or questions  - adhere to prescribed diet: heart healthy - develop an exercise routine

## 2021-04-02 DIAGNOSIS — Z419 Encounter for procedure for purposes other than remedying health state, unspecified: Secondary | ICD-10-CM | POA: Diagnosis not present

## 2021-04-10 ENCOUNTER — Ambulatory Visit: Payer: Self-pay | Admitting: *Deleted

## 2021-04-10 ENCOUNTER — Telehealth: Payer: Self-pay | Admitting: *Deleted

## 2021-04-10 NOTE — Telephone Encounter (Signed)
Per agent: "Fu with pt [phone] pt has a bad infection (rash) in groin. She states yeast.  First available same day was today but she said no way could get there. PCP has a Wednesday appt which pt was going to take but someone present  with her  thought she should not wait. No appt decided on, pls fu to access severity as seems to be a disagreement."   Chief Complaint: Redness "Irritation" at groin, Symptoms: Groin irritated from "Where pants rub." 2 inch area.  Frequency: 2 days ago Pertinent Negatives: Patient denies itching, drainage, fever Disposition: [] ED /[] Urgent Care (no appt availability in office) / [] Appointment(In office/virtual)/ []  [place] [facility]/ [] Home Care/ [] Refused Recommended Disposition /[] [place] [facility]/ [x]  Follow-up with PCP Additional Notes: Pt states with [facility] through [place]. States her nurse "Santiago Glad" is going to send a MyChart message for review. Reports she was given Desitin and that has helped. Assured pt NT would route to practice for PCPs review. Home care advise given.   Reason for Disposition  Mild localized rash  Answer Assessment - Initial Assessment Questions 1. APPEARANCE of RASH: "Describe the rash."      Diffuse red, 2 inches maybe. Irritated where pants rub, dry 2. LOCATION: "Where is the rash located?"     Right groin area 3. NUMBER: "How many spots are there?"      none 4. SIZE: "How big are the spots?" (Inches, centimeters or compare to size of a coin)      No spots 5. ONSET: "When did the rash start?"      2 days ago 6. ITCHING: "Does the rash itch?" If Yes, ask: "How bad is the itch?"  (Scale 0-10; or none, mild, moderate, severe)     no 7. PAIN: "Does the rash hurt?" If Yes, ask: "How bad is the pain?"  (Scale 0-10; or none, mild, moderate, severe)    - NONE (0): no pain    - MILD (1-3): doesn't interfere with normal activities     - MODERATE (4-7): interferes with normal activities or awakens from sleep     -  SEVERE (8-10): excruciating pain, unable to do any normal activities     "Irritated"  8. OTHER SYMPTOMS: "Do you have any other symptoms?" (e.g., fever)     no  Protocols used: Rash or Redness - Localized-A-AH

## 2021-04-10 NOTE — Patient Outreach (Signed)
Care Coordination  04/10/2021  Karen Foster 11/18/1960 [id number]  RNCM returning call to patient. Patient had called and left a message for RNCM. She is now able to see her Visit Information in MyChart. She is having a skin issue and has procured an appointment with PCP for 04/11/21 at 10:40am.    Estanislado Emms RN, BSN [place]   [facility] RN Care Coordinator

## 2021-04-10 NOTE — Congregational Nurse Program (Signed)
°  Dept: [phone]   Congregational Nurse Program Note  Date of Encounter: 04/10/2021 Client to clinic for vital sign check and support. She also reports a very red and raw area to her right groin. Area assessed, ?t vs moisture associated skin irritation. Desitin applied. Call placed to her PCP office, [facility]. Practice RN to call back client back with any open appointment in the next day or 2. Her next scheduled appointment is 3/16.  Client also concerned regarding her weight gain. She plans to discuss this at her appointment.  BP 108/72 (BP Location: Left Arm, Patient Position: Sitting, Cuff Size: Normal)    Pulse (!) 102    SpO2 95% room air. Support given.  Past Medical History: Past Medical History:  Diagnosis Date   Allergy    Anxiety    Arthritis    Depression    GERD (gastroesophageal reflux disease)    Glaucoma    Heart murmur    Kidney stones    Migraines    Panic attack    Panic attack     Encounter Details:

## 2021-04-10 NOTE — Telephone Encounter (Signed)
Appt scheduled tomorrow at 10:40

## 2021-04-11 ENCOUNTER — Encounter: Payer: Self-pay | Admitting: Nurse Practitioner

## 2021-04-11 ENCOUNTER — Other Ambulatory Visit: Payer: Self-pay

## 2021-04-11 ENCOUNTER — Telehealth: Payer: Self-pay | Admitting: Nurse Practitioner

## 2021-04-11 ENCOUNTER — Ambulatory Visit (INDEPENDENT_AMBULATORY_CARE_PROVIDER_SITE_OTHER): Payer: Medicaid Other | Admitting: Nurse Practitioner

## 2021-04-11 VITALS — BP 118/61 | HR 101 | Temp 98.7°F | Wt 160.6 lb

## 2021-04-11 DIAGNOSIS — R0602 Shortness of breath: Secondary | ICD-10-CM

## 2021-04-11 DIAGNOSIS — R21 Rash and other nonspecific skin eruption: Secondary | ICD-10-CM | POA: Diagnosis not present

## 2021-04-11 DIAGNOSIS — J449 Chronic obstructive pulmonary disease, unspecified: Secondary | ICD-10-CM | POA: Diagnosis not present

## 2021-04-11 MED ORDER — ALBUTEROL SULFATE (2.5 MG/3ML) 0.083% IN NEBU
2.5000 mg | INHALATION_SOLUTION | Freq: Once | RESPIRATORY_TRACT | Status: AC
  Administered 2021-04-11: 2.5 mg via RESPIRATORY_TRACT

## 2021-04-11 MED ORDER — PREDNISONE 10 MG PO TABS: 10.0000 mg | ORAL_TABLET | Freq: Every day | ORAL | 0 refills | Status: DC

## 2021-04-11 MED ORDER — NYSTATIN 100000 UNIT/GM EX CREA: 1.0000 "application " | TOPICAL_CREAM | Freq: Two times a day (BID) | CUTANEOUS | 0 refills | Status: DC

## 2021-04-11 MED ORDER — SPIRIVA RESPIMAT 2.5 MCG/ACT IN AERS: 2.0000 | INHALATION_SPRAY | Freq: Every day | RESPIRATORY_TRACT | 0 refills | Status: DC

## 2021-04-11 NOTE — Progress Notes (Signed)
BP 118/61    Pulse (!) 101    Temp 98.7 F (37.1 C) (Oral)    Wt 160 lb 9.6 oz (72.8 kg)    SpO2 96%    BMI 26.97 kg/m    Subjective:    Patient ID: Karen Foster, female    DOB: 10-03-1960, 61 y.o.   MRN: [id number]  HPI: Karen Foster is a 61 y.o. female  Chief Complaint  Patient presents with   Rash    Pt states she has a rash in her groin area. States she is gaulded in the area from her weight gain. States a nurse was at her home yesterday and checked the area. States she used [place] cream which helped some    Weight Gain    Pt states she would like to discuss her weight gain.    RASH Duration:  days  Location: legs  Itching: no Burning: yes Redness: yes Oozing: no Scaling: no Blisters: no Painful: yes Fevers: no Change in detergents/soaps/personal care products: no Recent illness: no Recent travel:no History of same: no Context: worse Alleviating factors: Desitin Treatments attempted: Desitin Shortness of breath: no  Throat/tongue swelling: no Myalgias/arthralgias: no  COPD COPD status: uncontrolled Satisfied with current treatment?: no Oxygen use: no Dyspnea frequency: everyday Cough frequency: yes Rescue inhaler frequency:  almost everyday (5/7 days) Limitation of activity: yes Productive cough: no Last Spirometry:  Pneumovax: Up to Date Influenza: Up to Date   Relevant past medical, surgical, family and social history reviewed and updated as indicated. Interim medical history since our last visit reviewed. Allergies and medications reviewed and updated.  Review of Systems  Respiratory:  Positive for cough and shortness of breath.   Skin:  Positive for rash.   Per HPI unless specifically indicated above     Objective:    BP 118/61    Pulse (!) 101    Temp 98.7 F (37.1 C) (Oral)    Wt 160 lb 9.6 oz (72.8 kg)    SpO2 96%    BMI 26.97 kg/m   Wt Readings from Last 3 Encounters:  04/11/21 160 lb 9.6 oz (72.8 kg)  02/28/21 159 lb (72.1 kg)   02/14/21 151 lb 9.6 oz (68.8 kg)    Physical Exam Vitals and nursing note reviewed.  Constitutional:      General: She is not in acute distress.    Appearance: Normal appearance. She is normal weight. She is not ill-appearing, toxic-appearing or diaphoretic.  HENT:     Head: Normocephalic.     Right Ear: External ear normal.     Left Ear: External ear normal.     Nose: Nose normal.     Mouth/Throat:     Mouth: Mucous membranes are moist.     Pharynx: Oropharynx is clear.  Eyes:     General:        Right eye: No discharge.        Left eye: No discharge.     Extraocular Movements: Extraocular movements intact.     Conjunctiva/sclera: Conjunctivae normal.     Pupils: Pupils are equal, round, and reactive to light.  Cardiovascular:     Rate and Rhythm: Normal rate and regular rhythm.     Heart sounds: No murmur heard. Pulmonary:     Effort: Pulmonary effort is normal. No respiratory distress.     Breath sounds: Wheezing present. No rales.  Musculoskeletal:     Cervical back: Normal range of motion and neck supple.  Skin:    General: Skin is warm and dry.     Capillary Refill: Capillary refill takes less than 2 seconds.  Neurological:     General: No focal deficit present.     Mental Status: She is alert and oriented to person, place, and time. Mental status is at baseline.  Psychiatric:        Mood and Affect: Mood normal.        Behavior: Behavior normal.        Thought Content: Thought content normal.        Judgment: Judgment normal.    Results for orders placed or performed in visit on 02/14/21  CBC with Differential/Platelet  Result Value Ref Range   WBC 7.2 3.4 - 10.8 x10E3/uL   RBC 4.21 3.77 - 5.28 x10E6/uL   Hemoglobin 14.6 11.1 - 15.9 g/dL   Hematocrit 43.0 34.0 - 46.6 %   MCV 102 (H) 79 - 97 fL   MCH 34.7 (H) 26.6 - 33.0 pg   MCHC 34.0 31.5 - 35.7 g/dL   RDW 12.4 11.7 - 15.4 %   Platelets 207 150 - 450 x10E3/uL   Neutrophils 57 Not Estab. %   Lymphs 33  Not Estab. %   Monocytes 9 Not Estab. %   Eos 1 Not Estab. %   Basos 0 Not Estab. %   Neutrophils Absolute 4.0 1.4 - 7.0 x10E3/uL   Lymphocytes Absolute 2.4 0.7 - 3.1 x10E3/uL   Monocytes Absolute 0.7 0.1 - 0.9 x10E3/uL   EOS (ABSOLUTE) 0.1 0.0 - 0.4 x10E3/uL   Basophils Absolute 0.0 0.0 - 0.2 x10E3/uL   Immature Granulocytes 0 Not Estab. %   Immature Grans (Abs) 0.0 0.0 - 0.1 x10E3/uL  Comprehensive metabolic panel  Result Value Ref Range   Glucose 95 70 - 99 mg/dL   BUN 15 6 - 24 mg/dL   Creatinine, Ser 0.55 (L) 0.57 - 1.00 mg/dL   eGFR 106 >59 mL/min/1.73   BUN/Creatinine Ratio 27 (H) 9 - 23   Sodium 145 (H) 134 - 144 mmol/L   Potassium 4.4 3.5 - 5.2 mmol/L   Chloride 113 (H) 96 - 106 mmol/L   CO2 18 (L) 20 - 29 mmol/L   Calcium 8.1 (L) 8.7 - 10.2 mg/dL   Total Protein 5.4 (L) 6.0 - 8.5 g/dL   Albumin 3.5 (L) 3.8 - 4.9 g/dL   Globulin, Total 1.9 1.5 - 4.5 g/dL   Albumin/Globulin Ratio 1.8 1.2 - 2.2   Bilirubin Total <0.2 0.0 - 1.2 mg/dL   Alkaline Phosphatase 84 44 - 121 IU/L   AST 13 0 - 40 IU/L   ALT 12 0 - 32 IU/L  Lipid panel  Result Value Ref Range   Cholesterol, Total 137 100 - 199 mg/dL   Triglycerides 470 (H) 0 - 149 mg/dL   HDL 22 (L) >39 mg/dL   VLDL Cholesterol Cal 69 (H) 5 - 40 mg/dL   LDL Chol Calc (NIH) 46 0 - 99 mg/dL   Chol/HDL Ratio 6.2 (H) 0.0 - 4.4 ratio  TSH  Result Value Ref Range   TSH 0.854 0.450 - 4.500 uIU/mL  Urinalysis, Routine w reflex microscopic  Result Value Ref Range   Specific Gravity, UA      >=1.030 (A) 1.005 - 1.030   pH, UA 5.5 5.0 - 7.5   Color, UA Yellow Yellow   Appearance Ur Clear Clear   Leukocytes,UA Negative Negative   Protein,UA Negative Negative/Trace   Glucose, UA  Negative Negative   Ketones, UA Negative Negative   RBC, UA Negative Negative   Bilirubin, UA Negative Negative   Urobilinogen, Ur 1.0 0.2 - 1.0 mg/dL   Nitrite, UA Negative Negative   Microscopic Examination Comment   Cytology - PAP  Result Value  Ref Range   Adequacy      Satisfactory for evaluation; transformation zone component ABSENT.   Diagnosis      - Negative for intraepithelial lesion or malignancy (NILM)      Assessment & Plan:   Problem List Items Addressed This Visit       Respiratory   COPD mixed type ([place]) - Primary    Chronic. Has ongoing SOB. Spirometry in office today relatively stable.  Patient endorses sleeping with 2 pillows, unable to lay down flat. Will add Spiriva in addition to Symbicort. Will obtain chest xray to evaluate for fluid overload.  Appears Euvolemic in office.  Follow up in 3 weeks for reevaluation.       Relevant Medications   Tiotropium Bromide Monohydrate (SPIRIVA RESPIMAT) 2.5 MCG/ACT AERS   predniSONE (DELTASONE) 10 MG tablet   Other Relevant Orders   Spirometry with Graph (Completed)   Other Visit Diagnoses     Rash       Likely related to yeast and incontienence. Will send nystatin cream. Continue with Desitin as barrier cream. Will send to Urology for incontienence.    Shortness of breath       Relevant Orders   DG Chest 2 View        Follow up plan: Return has follow up in 3 weeks.  Keep apt.Marland Kitchen

## 2021-04-11 NOTE — Telephone Encounter (Signed)
Please call patient and let her know that I would like her to obtain a chest xray to evaluate her lungs.  I also sent her a prescription for an additional inhaler called Spiriva to the pharmacy to help with her breathing.  I want her to continue the Symbicort.  If she isn't able to pick it up she can come back and get a sample.    I sent her in the prescription for prednisone.    Please remind her to make her follow up.

## 2021-04-11 NOTE — Telephone Encounter (Signed)
Called and notified patient of Karen's message. Patient verbalized understanding and has follow up appointment scheduled.

## 2021-04-11 NOTE — Progress Notes (Signed)
Results discussed with patient in office.  Relatively stable from prior.

## 2021-04-11 NOTE — Assessment & Plan Note (Signed)
Chronic. Has ongoing SOB. Spirometry in office today relatively stable.  Patient endorses sleeping with 2 pillows, unable to lay down flat. Will add Spiriva in addition to Symbicort. Will obtain chest xray to evaluate for fluid overload.  Appears Euvolemic in office.  Follow up in 3 weeks for reevaluation.

## 2021-04-17 ENCOUNTER — Other Ambulatory Visit: Payer: Self-pay | Admitting: Nurse Practitioner

## 2021-04-18 MED ORDER — CYCLOBENZAPRINE HCL 5 MG PO TABS: 5.0000 mg | ORAL_TABLET | Freq: Three times a day (TID) | ORAL | 1 refills | Status: DC | PRN

## 2021-04-18 NOTE — Telephone Encounter (Signed)
Requested medication (s) are due for refill today: yes  Requested medication (s) are on the active medication list: yes  Last refill:  02/14/21 #90 with 1 RF  Future visit scheduled: 05/15/21  Notes to clinic:  This medication can not be delegated, please assess.        Requested Prescriptions  Pending Prescriptions Disp Refills   cyclobenzaprine (FLEXERIL) 5 MG tablet [Pharmacy Med Name: Cyclobenzaprine HCl 5 MG Oral Tablet] 90 tablet 0    Sig: Take 1 tablet by mouth three times daily as needed for muscle spasm     Not Delegated - Analgesics:  Muscle Relaxants Failed - 04/17/2021  5:07 PM      Failed - This refill cannot be delegated      Passed - Valid encounter within last 6 months    Recent Outpatient Visits           1 week ago COPD mixed type [facility])   [facility] Larae Grooms, NP   1 month ago [facility] Larae Grooms, NP   2 months ago Annual physical exam   [facility] Larae Grooms, NP   3 months ago COPD mixed type [facility])   [facility] Larae Grooms, NP   5 months ago COPD mixed type [facility])   [facility] Larae Grooms, NP       Future Appointments             In 3 weeks Larae Grooms, NP [facility], [facility]

## 2021-04-18 NOTE — Telephone Encounter (Signed)
Pt called to report that she will be out of her current supply tomorrow, she says there are no more refills available at the pharmacy. She is requesting a refill urgently because the inclement weather affects her pain.   [facility] [address]), [place] - [address]  [address] [place] [address]  Phone: [phone] Fax: [phone]

## 2021-04-18 NOTE — Telephone Encounter (Signed)
Medication sent to the pharmacy.

## 2021-04-18 NOTE — Addendum Note (Signed)
Addended by: Larae Grooms on: 04/18/2021 03:26 PM   Modules accepted: Orders

## 2021-04-21 ENCOUNTER — Other Ambulatory Visit: Payer: Self-pay | Admitting: Nurse Practitioner

## 2021-04-21 ENCOUNTER — Telehealth: Payer: Self-pay | Admitting: Nurse Practitioner

## 2021-04-21 ENCOUNTER — Other Ambulatory Visit: Payer: Self-pay

## 2021-04-21 DIAGNOSIS — J4540 Moderate persistent asthma, uncomplicated: Secondary | ICD-10-CM

## 2021-04-21 DIAGNOSIS — G8929 Other chronic pain: Secondary | ICD-10-CM

## 2021-04-21 DIAGNOSIS — F17210 Nicotine dependence, cigarettes, uncomplicated: Secondary | ICD-10-CM

## 2021-04-21 DIAGNOSIS — Z87891 Personal history of nicotine dependence: Secondary | ICD-10-CM

## 2021-04-21 NOTE — Telephone Encounter (Signed)
Copied from CRM [id number]. Topic: General - Other >> Apr 21, 2021 11:44 AM Marylen Ponto wrote: Reason for CRM: Pt requesting Rx for a shower chair.

## 2021-04-22 NOTE — Telephone Encounter (Signed)
Requested Prescriptions  Pending Prescriptions Disp Refills   gabapentin (NEURONTIN) 300 MG capsule [Pharmacy Med Name: Gabapentin 300 MG Oral Capsule] 120 capsule 0    Sig: Take 1 capsule by mouth 4 times daily     Neurology: Anticonvulsants - gabapentin Failed - 04/21/2021 11:40 AM      Failed - Cr in normal range and within 360 days    Creatinine, Ser  Date Value Ref Range Status  02/14/2021 0.55 (L) 0.57 - 1.00 mg/dL Final         Passed - Completed PHQ-2 or PHQ-9 in the last 360 days      Passed - Valid encounter within last 12 months    Recent Outpatient Visits          1 week ago COPD mixed type [facility])   [facility] Jon Billings, NP   1 month ago [place], NP   2 months ago Annual physical exam   [place], NP   3 months ago COPD mixed type [facility])   [facility] Jon Billings, NP   5 months ago COPD mixed type [facility])   [place], NP      Future Appointments            In 3 weeks Jon Billings, NP [place], [facility]            QUEtiapine (SEROQUEL) 25 MG tablet [Pharmacy Med Name: QUEtiapine Fumarate 25 MG Oral Tablet] 120 tablet 0    Sig: TAKE 1 TABLET BY MOUTH IN THE MORNING AND 2 TABLETS AT BEDTIME. CAN INCREASE TO 3 TABLETS AT BEDTIME AFTER 1 WEEK.     Not Delegated - Psychiatry:  Antipsychotics - Second Generation (Atypical) - quetiapine Failed - 04/21/2021 11:40 AM      Failed - This refill cannot be delegated      Failed - Lipid Panel in normal range within the last 12 months    Cholesterol, Total  Date Value Ref Range Status  02/14/2021 137 100 - 199 mg/dL Final   LDL Chol Calc (NIH)  Date Value Ref Range Status  02/14/2021 46 0 - 99 mg/dL Final   HDL  Date Value Ref Range Status  02/14/2021 22 (L) >39 mg/dL Final   Triglycerides  Date Value Ref Range Status  02/14/2021 470 (H) 0 - 149 mg/dL  Final         Passed - TSH in normal range and within 360 days    TSH  Date Value Ref Range Status  02/14/2021 0.854 0.450 - 4.500 uIU/mL Final         Passed - Completed PHQ-2 or PHQ-9 in the last 360 days      Passed - Last BP in normal range    BP Readings from Last 1 Encounters:  04/11/21 118/61         Passed - Last Heart Rate in normal range    Pulse Readings from Last 1 Encounters:  04/11/21 (!) 101         Passed - Valid encounter within last 6 months    Recent Outpatient Visits          1 week ago COPD mixed type ([place])   [place] Jon Billings, NP   1 month ago [place], NP   2 months ago Annual physical exam   [facility],  [place], NP   3 months ago COPD mixed type [facility])   [place], NP   5 months ago COPD mixed type [facility])   [place], NP      Future Appointments            In 3 weeks Jon Billings, NP [facility], [facility]           Passed - CBC within normal limits and completed in the last 12 months    WBC  Date Value Ref Range Status  02/14/2021 7.2 3.4 - 10.8 x10E3/uL Final  02/10/2016 5.9 3.6 - 11.0 K/uL Final   RBC  Date Value Ref Range Status  02/14/2021 4.21 3.77 - 5.28 x10E6/uL Final  02/10/2016 4.00 3.80 - 5.20 MIL/uL Final   Hemoglobin  Date Value Ref Range Status  02/14/2021 14.6 11.1 - 15.9 g/dL Final   Hematocrit  Date Value Ref Range Status  02/14/2021 43.0 34.0 - 46.6 % Final   MCHC  Date Value Ref Range Status  02/14/2021 34.0 31.5 - 35.7 g/dL Final  02/10/2016 34.0 32.0 - 36.0 g/dL Final   [facility]  Date Value Ref Range Status  02/14/2021 34.7 (H) 26.6 - 33.0 pg Final  02/10/2016 35.1 (H) 26.0 - 34.0 pg Final   MCV  Date Value Ref Range Status  02/14/2021 102 (H) 79 - 97 fL Final   No results found for: PLTCOUNTKUC, LABPLAT, POCPLA RDW  Date Value Ref  Range Status  02/14/2021 12.4 11.7 - 15.4 % Final         Passed - CMP within normal limits and completed in the last 12 months    Albumin  Date Value Ref Range Status  02/14/2021 3.5 (L) 3.8 - 4.9 g/dL Final   Alkaline Phosphatase  Date Value Ref Range Status  02/14/2021 84 44 - 121 IU/L Final   ALT  Date Value Ref Range Status  02/14/2021 12 0 - 32 IU/L Final   AST  Date Value Ref Range Status  02/14/2021 13 0 - 40 IU/L Final   BUN  Date Value Ref Range Status  02/14/2021 15 6 - 24 mg/dL Final   Calcium  Date Value Ref Range Status  02/14/2021 8.1 (L) 8.7 - 10.2 mg/dL Final   CO2  Date Value Ref Range Status  02/14/2021 18 (L) 20 - 29 mmol/L Final   Creatinine, Ser  Date Value Ref Range Status  02/14/2021 0.55 (L) 0.57 - 1.00 mg/dL Final   Glucose  Date Value Ref Range Status  02/14/2021 95 70 - 99 mg/dL Final   Glucose, Bld  Date Value Ref Range Status  02/10/2016 107 (H) 65 - 99 mg/dL Final   Potassium  Date Value Ref Range Status  02/14/2021 4.4 3.5 - 5.2 mmol/L Final   Sodium  Date Value Ref Range Status  02/14/2021 145 (H) 134 - 144 mmol/L Final   Bilirubin Total  Date Value Ref Range Status  02/14/2021 <0.2 0.0 - 1.2 mg/dL Final   Protein, ur  Date Value Ref Range Status  02/10/2016 NEGATIVE NEGATIVE mg/dL Final   Protein,UA  Date Value Ref Range Status  02/14/2021 Negative Negative/Trace Final   Total Protein  Date Value Ref Range Status  02/14/2021 5.4 (L) 6.0 - 8.5 g/dL Final   GFR calc Af Amer  Date Value Ref Range Status  02/18/2018 94 >59 mL/min/1.73 Final   eGFR  Date Value Ref Range Status  02/14/2021 106 >59  mL/min/1.73 Final   GFR calc non Af Amer  Date Value Ref Range Status  02/18/2018 81 >59 mL/min/1.73 Final          benzonatate (TESSALON) 200 MG capsule [Pharmacy Med Name: Benzonatate 200 MG Oral Capsule] 90 capsule 0    Sig: Take 1 capsule by mouth three times daily as needed for cough     Ear, Nose, and  Throat:  Antitussives/Expectorants Passed - 04/21/2021 11:40 AM      Passed - Valid encounter within last 12 months    Recent Outpatient Visits          1 week ago COPD mixed type ([place])   [facility] Jon Billings, NP   1 month ago [facility] Jon Billings, NP   2 months ago Annual physical exam   [facility] Jon Billings, NP   3 months ago COPD mixed type [facility])   [facility] Jon Billings, NP   5 months ago COPD mixed type [facility])   [facility] Jon Billings, NP      Future Appointments            In 3 weeks Jon Billings, NP [place], [place] 2.5 MCG/ACT AERS [Pharmacy Med Name: Spiriva Respimat 2.5 MCG/ACT Inhalation Aerosol Solution] 4 g 0    Sig: INHALE 2 SPRAY(S) BY MOUTH ONCE DAILY     Pulmonology:  Anticholinergic Agents Passed - 04/21/2021 11:40 AM      Passed - Valid encounter within last 12 months    Recent Outpatient Visits          1 week ago COPD mixed type ([place])   [facility] Jon Billings, NP   1 month ago [place], Karen, NP   2 months ago Annual physical exam   [place], NP   3 months ago COPD mixed type [facility])   [facility] Jon Billings, NP   5 months ago COPD mixed type [facility])   [facility] Jon Billings, NP      Future Appointments            In 3 weeks Jon Billings, NP [facility], [place]            SYMBICORT 160-4.5 MCG/ACT inhaler [Pharmacy Med Name: Symbicort 160-4.5 MCG/ACT Inhalation Aerosol] 11 g 0    Sig: Inhale 2 puffs by mouth twice daily     Pulmonology:  Combination Products Passed - 04/21/2021 11:40 AM      Passed - Valid encounter within last 12 months    Recent Outpatient Visits          1 week ago COPD mixed type [facility])   [facility]  Jon Billings, NP   1 month ago [place], Karen, NP   2 months ago Annual physical exam   [place], NP   3 months ago COPD mixed type [facility])   [facility] Jon Billings, NP   5 months ago COPD mixed type [facility])   [facility] Jon Billings, NP      Future Appointments            In 3 weeks Jon Billings, NP [facility], [place]

## 2021-04-22 NOTE — Telephone Encounter (Signed)
Requested medication (s) are due for refill today- multiple requests  Requested medication (s) are on the active medication list -yes  Future visit scheduled -yes  Last refill: Quetiapine 02/14/21 #120 1 RF- non delegated Rx                  Tessalon- discontinued 04/11/21- course complete                  Spiriva- 04/11/21 4g- original Rx no RF- patient to follow up in office  Notes to clinic: Request for RF for above- sent for review - note Symbicort -last filled 02/14/21 #3RF- refused too early( not routed for review)  Requested Prescriptions  Pending Prescriptions Disp Refills   QUEtiapine (SEROQUEL) 25 MG tablet [Pharmacy Med Name: QUEtiapine Fumarate 25 MG Oral Tablet] 120 tablet 0    Sig: TAKE 1 TABLET BY MOUTH IN THE MORNING AND 2 TABLETS AT BEDTIME. CAN INCREASE TO 3 TABLETS AT BEDTIME AFTER 1 WEEK.     Not Delegated - Psychiatry:  Antipsychotics - Second Generation (Atypical) - quetiapine Failed - 04/21/2021 11:40 AM      Failed - This refill cannot be delegated      Failed - Lipid Panel in normal range within the last 12 months    Cholesterol, Total  Date Value Ref Range Status  02/14/2021 137 100 - 199 mg/dL Final   LDL Chol Calc (NIH)  Date Value Ref Range Status  02/14/2021 46 0 - 99 mg/dL Final   HDL  Date Value Ref Range Status  02/14/2021 22 (L) >39 mg/dL Final   Triglycerides  Date Value Ref Range Status  02/14/2021 470 (H) 0 - 149 mg/dL Final         Passed - TSH in normal range and within 360 days    TSH  Date Value Ref Range Status  02/14/2021 0.854 0.450 - 4.500 uIU/mL Final          Passed - Completed PHQ-2 or PHQ-9 in the last 360 days      Passed - Last BP in normal range    BP Readings from Last 1 Encounters:  04/11/21 118/61          Passed - Last Heart Rate in normal range    Pulse Readings from Last 1 Encounters:  04/11/21 (!) 101          Passed - Valid encounter within last 6 months    Recent Outpatient Visits           1  week ago COPD mixed type ([place])   [facility] Jon Billings, NP   1 month ago [facility] Jon Billings, NP   2 months ago Annual physical exam   [facility] Jon Billings, NP   3 months ago COPD mixed type [facility])   [facility] Jon Billings, NP   5 months ago COPD mixed type [facility])   [facility], NP       Future Appointments             In 3 weeks Jon Billings, NP [facility], [facility]            Passed - CBC within normal limits and completed in the last 12 months    WBC  Date Value Ref Range Status  02/14/2021 7.2 3.4 - 10.8 x10E3/uL Final  02/10/2016 5.9 3.6 - 11.0 K/uL Final   RBC  Date  Value Ref Range Status  02/14/2021 4.21 3.77 - 5.28 x10E6/uL Final  02/10/2016 4.00 3.80 - 5.20 MIL/uL Final   Hemoglobin  Date Value Ref Range Status  02/14/2021 14.6 11.1 - 15.9 g/dL Final   Hematocrit  Date Value Ref Range Status  02/14/2021 43.0 34.0 - 46.6 % Final   MCHC  Date Value Ref Range Status  02/14/2021 34.0 31.5 - 35.7 g/dL Final  02/10/2016 34.0 32.0 - 36.0 g/dL Final   [facility]  Date Value Ref Range Status  02/14/2021 34.7 (H) 26.6 - 33.0 pg Final  02/10/2016 35.1 (H) 26.0 - 34.0 pg Final   MCV  Date Value Ref Range Status  02/14/2021 102 (H) 79 - 97 fL Final   No results found for: PLTCOUNTKUC, LABPLAT, POCPLA RDW  Date Value Ref Range Status  02/14/2021 12.4 11.7 - 15.4 % Final         Passed - CMP within normal limits and completed in the last 12 months    Albumin  Date Value Ref Range Status  02/14/2021 3.5 (L) 3.8 - 4.9 g/dL Final   Alkaline Phosphatase  Date Value Ref Range Status  02/14/2021 84 44 - 121 IU/L Final   ALT  Date Value Ref Range Status  02/14/2021 12 0 - 32 IU/L Final   AST  Date Value Ref Range Status  02/14/2021 13 0 - 40 IU/L Final   BUN  Date Value Ref Range Status  02/14/2021 15 6 - 24 mg/dL Final    Calcium  Date Value Ref Range Status  02/14/2021 8.1 (L) 8.7 - 10.2 mg/dL Final   CO2  Date Value Ref Range Status  02/14/2021 18 (L) 20 - 29 mmol/L Final   Creatinine, Ser  Date Value Ref Range Status  02/14/2021 0.55 (L) 0.57 - 1.00 mg/dL Final   Glucose  Date Value Ref Range Status  02/14/2021 95 70 - 99 mg/dL Final   Glucose, Bld  Date Value Ref Range Status  02/10/2016 107 (H) 65 - 99 mg/dL Final   Potassium  Date Value Ref Range Status  02/14/2021 4.4 3.5 - 5.2 mmol/L Final   Sodium  Date Value Ref Range Status  02/14/2021 145 (H) 134 - 144 mmol/L Final   Bilirubin Total  Date Value Ref Range Status  02/14/2021 <0.2 0.0 - 1.2 mg/dL Final   Protein, ur  Date Value Ref Range Status  02/10/2016 NEGATIVE NEGATIVE mg/dL Final   Protein,UA  Date Value Ref Range Status  02/14/2021 Negative Negative/Trace Final   Total Protein  Date Value Ref Range Status  02/14/2021 5.4 (L) 6.0 - 8.5 g/dL Final   GFR calc Af Amer  Date Value Ref Range Status  02/18/2018 94 >59 mL/min/1.73 Final   eGFR  Date Value Ref Range Status  02/14/2021 106 >59 mL/min/1.73 Final   GFR calc non Af Amer  Date Value Ref Range Status  02/18/2018 81 >59 mL/min/1.73 Final          benzonatate (TESSALON) 200 MG capsule [Pharmacy Med Name: Benzonatate 200 MG Oral Capsule] 90 capsule 0    Sig: Take 1 capsule by mouth three times daily as needed for cough     Ear, Nose, and Throat:  Antitussives/Expectorants Passed - 04/21/2021 11:40 AM      Passed - Valid encounter within last 12 months    Recent Outpatient Visits           1 week ago COPD mixed type ([place])   [place]  Jon Billings, NP   1 month ago [facility] Jon Billings, NP   2 months ago Annual physical exam   [facility] Jon Bill[facility], NP   3 months ago COPD mixed type [facility])   [facility] Jon Billings, NP   5 months ago COPD mixed type [facility])    [facility] Jon Billings, NP       Future Appointments             In 3 weeks Jon Billings, NP [place] 2.5 MCG/ACT AERS [Pharmacy Med Name: Spiriva Respimat 2.5 MCG/ACT Inhalation Aerosol Solution] 4 g 0    Sig: INHALE 2 SPRAY(S) BY MOUTH ONCE DAILY     Pulmonology:  Anticholinergic Agents Passed - 04/21/2021 11:40 AM      Passed - Valid encounter within last 12 months    Recent Outpatient Visits           1 week ago COPD mixed type ([place])   [facility] Jon Billings, NP   1 month ago [place], Santiago Glad, NP   2 months ago Annual physical exam   [facility] Bill[facility], NP   3 months ago COPD mixed type [facility])   [facility]   5 months ago COPD mixed type [facility])   [facility] Jon Billings, NP       Future Appointments             In 3 weeks Jon Billings, NP [place], [facility]            Signed Prescriptions Disp Refills   gabapentin (NEURONTIN) 300 MG capsule 120 capsule 0    Sig: Take 1 capsule by mouth 4 times daily     Neurology: Anticonvulsants - gabapentin Failed - 04/21/2021 11:40 AM      Failed - Cr in normal range and within 360 days    Creatinine, Ser  Date Value Ref Range Status  02/14/2021 0.55 (L) 0.57 - 1.00 mg/dL Final          Passed - Completed PHQ-2 or PHQ-9 in the last 360 days      Passed - Valid encounter within last 12 months    Recent Outpatient Visits           1 week ago COPD mixed type [facility])   [facility] Jon Billings, NP   1 month ago [place], Karen, NP   2 months ago Annual physical exam   [facility] Jon Billings, NP   3 months ago COPD mixed type [facility])   [facility] Jon Billings, NP   5 months ago COPD mixed type [facility])    [facility] Jon Billings, NP       Future Appointments             In 3 weeks Jon Billings, NP [place], [facility]            Refused Prescriptions Disp Refills   SYMBICORT 160-4.5 MCG/ACT inhaler [Pharmacy Med Name: Symbicort 160-4.5 MCG/ACT Inhalation Aerosol] 11 g 0    Sig: Inhale 2 puffs by mouth twice daily     Pulmonology:  Combination Products Passed - 04/21/2021 11:40 AM      Passed - Valid encounter within  last 12 months    Recent Outpatient Visits           1 week ago COPD mixed type [facility])   [facility] Jon Billings, NP   1 month ago [facility] Jon Bill[facility], NP   2 months ago Annual physical exam   [facility], NP   3 months ago COPD mixed type [facility])   [facility], NP   5 months ago COPD mixed type [facility])   [facility] Jon Billings, NP       Future Appointments             In 3 weeks Jon Billings, NP [facility], [facility]               Requested Prescriptions  Pending Prescriptions Disp Refills   QUEtiapine (SEROQUEL) 25 MG tablet [Pharmacy Med Name: QUEtiapine Fumarate 25 MG Oral Tablet] 120 tablet 0    Sig: TAKE 1 TABLET BY MOUTH IN THE MORNING AND 2 TABLETS AT BEDTIME. CAN INCREASE TO 3 TABLETS AT BEDTIME AFTER 1 WEEK.     Not Delegated - Psychiatry:  Antipsychotics - Second Generation (Atypical) - quetiapine Failed - 04/21/2021 11:40 AM      Failed - This refill cannot be delegated      Failed - Lipid Panel in normal range within the last 12 months    Cholesterol, Total  Date Value Ref Range Status  02/14/2021 137 100 - 199 mg/dL Final   LDL Chol Calc (NIH)  Date Value Ref Range Status  02/14/2021 46 0 - 99 mg/dL Final   HDL  Date Value Ref Range Status  02/14/2021 22 (L) >39 mg/dL Final   Triglycerides  Date Value Ref Range Status  02/14/2021 470 (H) 0 - 149  mg/dL Final         Passed - TSH in normal range and within 360 days    TSH  Date Value Ref Range Status  02/14/2021 0.854 0.450 - 4.500 uIU/mL Final          Passed - Completed PHQ-2 or PHQ-9 in the last 360 days      Passed - Last BP in normal range    BP Readings from Last 1 Encounters:  04/11/21 118/61          Passed - Last Heart Rate in normal range    Pulse Readings from Last 1 Encounters:  04/11/21 (!) 101          Passed - Valid encounter within last 6 months    Recent Outpatient Visits           1 week ago COPD mixed type ([place])   [facility] Bill[facility], NP   1 month ago [facility] Jon Billings, NP   2 months ago Annual physical exam   [place], NP   3 months ago COPD mixed type [facility])   [facility] Jon Billings, NP   5 months ago COPD mixed type [facility])   [place], NP       Future Appointments             In 3 weeks Jon Billings, NP [facility], [facility]            Passed - CBC within normal limits and completed in the last 12 months    WBC  Date Value Ref Range Status  02/14/2021 7.2 3.4 - 10.8 x10E3/uL Final  02/10/2016 5.9 3.6 - 11.0 K/uL Final   RBC  Date Value Ref Range Status  02/14/2021 4.21 3.77 - 5.28 x10E6/uL Final  02/10/2016 4.00 3.80 - 5.20 MIL/uL Final   Hemoglobin  Date Value Ref Range Status  02/14/2021 14.6 11.1 - 15.9 g/dL Final   Hematocrit  Date Value Ref Range Status  02/14/2021 43.0 34.0 - 46.6 % Final   MCHC  Date Value Ref Range Status  02/14/2021 34.0 31.5 - 35.7 g/dL Final  02/10/2016 34.0 32.0 - 36.0 g/dL Final   [facility]  Date Value Ref Range Status  02/14/2021 34.7 (H) 26.6 - 33.0 pg Final  02/10/2016 35.1 (H) 26.0 - 34.0 pg Final   MCV  Date Value Ref Range Status  02/14/2021 102 (H) 79 - 97 fL Final   No results found for: PLTCOUNTKUC, LABPLAT, POCPLA RDW   Date Value Ref Range Status  02/14/2021 12.4 11.7 - 15.4 % Final         Passed - CMP within normal limits and completed in the last 12 months    Albumin  Date Value Ref Range Status  02/14/2021 3.5 (L) 3.8 - 4.9 g/dL Final   Alkaline Phosphatase  Date Value Ref Range Status  02/14/2021 84 44 - 121 IU/L Final   ALT  Date Value Ref Range Status  02/14/2021 12 0 - 32 IU/L Final   AST  Date Value Ref Range Status  02/14/2021 13 0 - 40 IU/L Final   BUN  Date Value Ref Range Status  02/14/2021 15 6 - 24 mg/dL Final   Calcium  Date Value Ref Range Status  02/14/2021 8.1 (L) 8.7 - 10.2 mg/dL Final   CO2  Date Value Ref Range Status  02/14/2021 18 (L) 20 - 29 mmol/L Final   Creatinine, Ser  Date Value Ref Range Status  02/14/2021 0.55 (L) 0.57 - 1.00 mg/dL Final   Glucose  Date Value Ref Range Status  02/14/2021 95 70 - 99 mg/dL Final   Glucose, Bld  Date Value Ref Range Status  02/10/2016 107 (H) 65 - 99 mg/dL Final   Potassium  Date Value Ref Range Status  02/14/2021 4.4 3.5 - 5.2 mmol/L Final   Sodium  Date Value Ref Range Status  02/14/2021 145 (H) 134 - 144 mmol/L Final   Bilirubin Total  Date Value Ref Range Status  02/14/2021 <0.2 0.0 - 1.2 mg/dL Final   Protein, ur  Date Value Ref Range Status  02/10/2016 NEGATIVE NEGATIVE mg/dL Final   Protein,UA  Date Value Ref Range Status  02/14/2021 Negative Negative/Trace Final   Total Protein  Date Value Ref Range Status  02/14/2021 5.4 (L) 6.0 - 8.5 g/dL Final   GFR calc Af Amer  Date Value Ref Range Status  02/18/2018 94 >59 mL/min/1.73 Final   eGFR  Date Value Ref Range Status  02/14/2021 106 >59 mL/min/1.73 Final   GFR calc non Af Amer  Date Value Ref Range Status  02/18/2018 81 >59 mL/min/1.73 Final          benzonatate (TESSALON) 200 MG capsule [Pharmacy Med Name: Benzonatate 200 MG Oral Capsule] 90 capsule 0    Sig: Take 1 capsule by mouth three times daily as needed for cough      Ear, Nose, and Throat:  Antitussives/Expectorants Passed - 04/21/2021 11:40 AM      Passed - Valid encounter within last 12 months  Recent Outpatient Visits           1 week ago COPD mixed type [facility])   [facility] Jon Bill[facility], NP   1 month ago [facility], NP   2 months ago Annual physical exam   [facility] Jon Billings, NP   3 months ago COPD mixed type [facility])   [facility] Jon Billings, NP   5 months ago COPD mixed type [facility])   [facility] Jon Billings, NP       Future Appointments             In 3 weeks Jon Billings, NP [place], [place] 2.5 MCG/ACT AERS [Pharmacy Med Name: Spiriva Respimat 2.5 MCG/ACT Inhalation Aerosol Solution] 4 g 0    Sig: INHALE 2 SPRAY(S) BY MOUTH ONCE DAILY     Pulmonology:  Anticholinergic Agents Passed - 04/21/2021 11:40 AM      Passed - Valid encounter within last 12 months    Recent Outpatient Visits           1 week ago COPD mixed type ([place])   [facility] Jon Billings, NP   1 month ago [place], Santiago Glad, NP   2 months ago Annual physical exam   [facility] Jon Billings, NP   3 months ago COPD mixed type [facility])   [facility] Jon Billings, NP   5 months ago COPD mixed type [facility])   [facility] Jon Billings, NP       Future Appointments             In 3 weeks Jon Billings, NP [place], [facility]            Signed Prescriptions Disp Refills   gabapentin (NEURONTIN) 300 MG capsule 120 capsule 0    Sig: Take 1 capsule by mouth 4 times daily     Neurology: Anticonvulsants - gabapentin Failed - 04/21/2021 11:40 AM      Failed - Cr in normal range and within 360 days    Creatinine, Ser  Date Value Ref Range Status  02/14/2021 0.55 (L) 0.57 - 1.00 mg/dL  Final          Passed - Completed PHQ-2 or PHQ-9 in the last 360 days      Passed - Valid encounter within last 12 months    Recent Outpatient Visits           1 week ago COPD mixed type [facility])   [facility] Jon Billings, NP   1 month ago [place], Karen, NP   2 months ago Annual physical exam   [facility] Jon Billings, NP   3 months ago COPD mixed type [facility])   [facility] Jon Billings, NP   5 months ago COPD mixed type [facility])   [facility] Jon Billings, NP       Future Appointments             In 3 weeks Jon Billings, NP [place], [facility]            Refused Prescriptions Disp Refills   SYMBICORT 160-4.5 MCG/ACT inhaler [Pharmacy Med Name: Symbicort 160-4.5 MCG/ACT Inhalation Aerosol] 11 g 0    Sig: Inhale 2 puffs by mouth twice  daily     Pulmonology:  Combination Products Passed - 04/21/2021 11:40 AM      Passed - Valid encounter within last 12 months    Recent Outpatient Visits           1 week ago COPD mixed type [facility])   [facility] Jon Billings, NP   1 month ago [facility] Jon Billings, NP   2 months ago Annual physical exam   [place], NP   3 months ago COPD mixed type [facility])   [facility] Jon Billings, NP   5 months ago COPD mixed type [facility])   [facility] Jon Billings, NP       Future Appointments             In 3 weeks Jon Billings, NP [facility], [place]

## 2021-04-22 NOTE — Telephone Encounter (Signed)
Pt calling back requesting an update on medication refill.   Pt requesting a call back.

## 2021-04-29 ENCOUNTER — Other Ambulatory Visit: Payer: Self-pay | Admitting: *Deleted

## 2021-04-29 ENCOUNTER — Other Ambulatory Visit: Payer: Self-pay

## 2021-04-29 NOTE — Patient Instructions (Signed)
Visit Information  Karen Foster was given information about Medicaid Managed Care team care coordination services as a part of their [place] Medicaid benefit. Karen Foster verbally consented to engagement with the [facility] Managed Care team.   If you are experiencing a medical emergency, please call 911 or report to your local emergency department or urgent care.   If you have a non-emergency medical problem during routine business hours, please contact your provider's office and ask to speak with a nurse.   For questions related to your [facility] health plan, please call: [phone] or go here:[email or web address]  If you would like to schedule transportation through your [facility] plan, please call the following number at least 2 days in advance of your appointment: [phone].  You can also use the MTM portal or MTM mobile app to manage your rides. For the portal, please go to [email or web address].  Call the [place] at [phone], at any time, 24 hours a day, 7 days a week. If you are in danger or need immediate medical attention call 911.  If you would like help to quit smoking, call 1-800-QUIT-NOW 803-336-2108) OR Espaol: 1-855-Djelo-Ya QO:409462) o para ms informacin haga clic aqu or Text READY to 200-400 to register via text  Karen Foster,   Please see education materials related to COPD provided by Karen Foster link.  Patient verbalizes understanding of instructions and care plan provided today and agrees to view in [place]. Active Karen Foster status confirmed with patient.    Telephone follow up appointment with Managed Medicaid care management team member scheduled for:05/14/21 @ 11:15am  Karen Joiner RN, BSN [place] RN Care Coordinator   Following is a copy of your plan of care:  Care Plan : RN Care Manager Plan of Care  Updates made by Karen Montane, RN since 04/29/2021 12:00 AM     Problem:  Chronic health managment needs related to COPD      Long-Range Goal: Development of Plan of Care to address health managment needs related to COPD   Start Date: 01/27/2021  Expected End Date: 05/30/2021  Priority: High  Note:   Current Barriers:  Chronic Disease Management support and education needs related to HLD Karen Foster is feeling overwhelmed with needing multiple provider appointments. Karen Foster continues to have concern re: weight gain, now weighing 160 pounds. She is staying at a friends house while she awaits for survivor benefits to be determined.   RNCM Clinical Goal(s):  Patient will verbalize understanding of plan for management of HLD as evidenced by patient verbalization and self monitoring activity take all medications exactly as prescribed and will call provider for medication related questions as evidenced by documentation in EMR    attend all scheduled medical appointments: rescheduling mammogram and Cardiology as evidenced by provider documentation in EMR        continue to work with RN Care Manager and/or Social Worker to address care management and care coordination needs related to HLD as evidenced by adherence to CM Team Scheduled appointments     through collaboration with RN Care manager, provider, and care team.   Interventions: Inter-disciplinary care team collaboration (see longitudinal plan of care) Evaluation of current treatment plan related to  self management and patient's adherence to plan as established by provider Advised patient to utilize medical transportation provided by [facility] [employer] [phone]-must call 2-3 days before your appointment and provide the address and phone number to the appointment. Discussed the importance  in attending scheduled appointments and rescheduling missed appointment with Cardiology-Karen Foster will schedule Cardiology appointment tomorrow. Collaborated with PCP re: chest x-ray Provided therapeutic listening Discussed  referral to LCSW for managing anxiety and emotions-patient declined, she benefits from talking to the Congregational Nurse   COPD Interventions:  (Status:  New goal.) Long Term Goal Provided patient with basic written and verbal COPD education on self care/management/and exacerbation prevention Provided instruction about proper use of medications used for management of COPD including inhalers Provided education about and advised patient to utilize infection prevention strategies to reduce risk of respiratory infection Advised patient to attend visit with Pulmonology and CT for lung cancer screening  Hyperlipidemia:  (Status: Condition stable. Not addressed this visit.) Long Term Goal  Lab Results  Component Value Date   CHOL 137 02/14/2021   HDL 22 (L) 02/14/2021   LDLCALC 46 02/14/2021   TRIG 470 (H) 02/14/2021   CHOLHDL 6.2 (H) 02/14/2021     Medication review performed; medication list updated in electronic medical record.  Counseled on importance of regular laboratory monitoring as prescribed; Provided HLD educational materials; Reviewed exercise goals and target of 150 minutes per week; Assessed social determinant of health barriers;  Advised patient to cut back on drinking Pepsi and increase her water intake Encouraged patient to discuss any concerns or questions with provider  Patient Goals/Self-Care Activities: Take medications as prescribed   Attend all scheduled provider appointments Call pharmacy for medication refills 3-7 days in advance of running out of medications Attend church or other social activities Perform all self care activities independently  Call provider office for new concerns or questions  - adhere to prescribed diet: heart healthy - develop an exercise routine

## 2021-04-29 NOTE — Congregational Nurse Program (Signed)
°  Dept: [phone]   Congregational Nurse Program Note  Date of Encounter: 04/29/2021 Client to center to discuss upcoming appointments and share news of her approval for survivor benefits. She has been recommended to have a CT for lung Cancer screening based on her smoking history and continued wheezing. Appointment is set for 3/7 at 1:30. Per her report her PCP would like her to again see a cardiologist. At this time Karen Foster would like to focus on the CT appointment, then the plan is for this RN to assist with making a cardiology appointment. She also continues to to have concerns regarding her weight gain. No edema to feet, ankles or legs.  She reports having the discussion with her PCP, with no changes in meds at this time. Support given, client to return to the center next week after her CT has been completed. Past Medical History: Past Medical History:  Diagnosis Date   Allergy    Anxiety    Arthritis    Depression    GERD (gastroesophageal reflux disease)    Glaucoma    Heart murmur    Kidney stones    Migraines    Panic attack    Panic attack     Encounter Details:  CNP Questionnaire - 04/29/21 1213       Questionnaire   Do you give verbal consent to treat you today? Yes    Location Patient Served  [facility]    Visit Setting Church or Organization    Patient Status Unknown    Insurance Medicaid   well care   Insurance Referral N/A    Medication N/A   no copay for meds with her current insurance   Medical Provider Yes    Screening Referrals N/A   discussed need for [facility]   Medical Referral N/A    Medical Appointment Made N/A    Food N/A    Transportation N/A   has friends that provide trnasportation, or the [place] bus   Housing/Utilities No permanent housing   currenlty living in a car on a  friends property   Interpersonal Safety N/A    Intervention Educate;Support;Navigate Healthcare System    ED Visit Averted N/A    Life-Saving Intervention Made N/A

## 2021-04-29 NOTE — Patient Outreach (Signed)
Medicaid Managed Care   Nurse Care Manager Note  04/29/2021 Name:  Karen Foster MRN:  [id number] DOB:  04-03-1960  Karen Foster is an 61 y.o. year old female who is Foster primary Foster of Karen Grooms, NP.  The [facility] Managed Care Coordination team was consulted for assistance with:    COPD  Karen Foster was given information about Medicaid Managed Care Coordination team services today. Karen Foster agreed to services and verbal consent obtained.  Engaged with Foster by telephone for follow up visit in response to provider referral for case management and/or care coordination services.   Assessments/Interventions:  Review of past medical history, allergies, medications, health status, including review of consultants reports, laboratory and other test data, was performed as part of comprehensive evaluation and provision of chronic care management services.  SDOH (Social Determinants of Health) assessments and interventions performed: SDOH Interventions    Flowsheet Row Most Recent Value  SDOH Interventions   Transportation Interventions Other (Comment)  [Provided Foster with information on medical transportation [phone], discussed details on scheduling.]       Care Plan  Allergies  Allergen Reactions   Bee Venom Anaphylaxis   Ibuprofen Anaphylaxis   Sulfa Antibiotics Anaphylaxis   Tomato Anaphylaxis and Nausea And Vomiting   Hydroxyzine Nausea Only    Medications Reviewed Today     Reviewed by Karen Dach, RN (Registered Nurse) on 04/29/21 at 1607  Med List Status: <None>   Medication Order Taking? Sig Documenting Provider Last Dose Status Informant  albuterol (VENTOLIN HFA) 108 (90 Base) MCG/ACT inhaler [id number] Yes Inhale 2 puffs into the lungs every 6 (six) hours as needed for wheezing or shortness of breath. Karen Grooms, NP Taking Active   benzonatate (TESSALON) 200 MG capsule [id number] Yes Take 1 capsule by mouth three times daily as needed  for cough Karen Grooms, NP Taking Active   budesonide-formoterol [facility]) 160-4.5 MCG/ACT inhaler [id number] Yes Inhale 2 puffs into the lungs 2 (two) times daily. Karen Grooms, NP Taking Active   cyclobenzaprine (FLEXERIL) 5 MG tablet [id number] Yes Take 1 tablet (5 mg total) by mouth 3 (three) times daily as needed for muscle spasms. Karen Grooms, NP Taking Active   diclofenac sodium (VOLTAREN) 1 % GEL [id number] Yes Apply 2 g topically 4 (four) times daily as needed. Karen Foster, [place] Taking Active   DULoxetine (CYMBALTA) 20 MG capsule [id number] Yes Take 1 capsule (20 mg total) by mouth daily. Karen Grooms, NP Taking Active   gabapentin (NEURONTIN) 300 MG capsule [id number] Yes Take 1 capsule by mouth 4 times daily Karen Grooms, NP Taking Active   montelukast (SINGULAIR) 10 MG tablet [id number] Yes Take 1 tablet (10 mg total) by mouth at bedtime. Karen Grooms, NP Taking Active   nystatin cream (MYCOSTATIN) [id number] No Apply 1 application topically 2 (two) times daily.  Foster not taking: Reported on 04/29/2021   Karen Grooms, NP Not Taking Active   pantoprazole (PROTONIX) 20 MG tablet [id number] Yes Take 1 tablet (20 mg total) by mouth 2 (two) times daily. Karen Grooms, NP Taking Active   predniSONE (DELTASONE) 10 MG tablet [id number] No Take 1 tablet (10 mg total) by mouth daily with breakfast. Take 6 today, 5 tomorrow, and decrease by 1 each day until course is complete.  Foster not taking: Reported on 04/29/2021   Karen Grooms, NP Not Taking Active            Med Note (Karen Foster   Tue Apr 29, 2021  3:53 PM) completed  QUEtiapine (SEROQUEL) 25 MG tablet [id number] Yes TAKE 1 TABLET BY MOUTH IN THE MORNING AND 2 TABLETS AT BEDTIME. CAN INCREASE TO 3 TABLETS AT BEDTIME AFTER 1 WEEK. Karen Grooms, NP Taking Active   rosuvastatin (CRESTOR) 10 MG tablet [id number] Yes Take 1 tablet (10 mg total) by mouth daily. Karen Grooms, NP  Taking Active   SPIRIVA RESPIMAT 2.5 MCG/ACT AERS [id number] Yes INHALE 2 SPRAY(S) BY MOUTH ONCE DAILY Karen Grooms, NP Taking Active             Foster Active Problem List   Diagnosis Date Noted   Other chronic pain 12/25/2020   Allergic rhinitis 05/26/2019   Urticaria 12/01/2018   COPD mixed type (HCC) 12/01/2018   Cigarette smoker 06/08/2018   Arthritis 02/19/2018   Hemorrhoids 02/18/2018   Reactive airway disease 02/18/2018   Migraines 12/12/2017   Deviated septum 12/03/2017   Heart murmur 12/03/2017   Depression 12/03/2017   Panic disorder     Conditions to be addressed/monitored per PCP order:  COPD  Care Plan : RN Care Manager Plan of Care  Updates made by Karen Dach, RN since 04/29/2021 12:00 AM     Problem: Chronic health managment needs related to COPD      Long-Range Goal: Development of Plan of Care to address health managment needs related to COPD   Start Date: 01/27/2021  Expected End Date: 05/30/2021  Priority: High  Note:   Current Barriers:  Chronic Disease Management support and education needs related to HLD Karen Foster is feeling overwhelmed with needing multiple provider appointments. Karen Foster continues to have concern re: weight gain, now weighing 160 pounds. She is staying at Foster friends house while she awaits for survivor benefits to be determined.   RNCM Clinical Goal(s):  Foster will verbalize understanding of plan for management of HLD as evidenced by Foster verbalization and self monitoring activity take all medications exactly as prescribed and will call provider for medication related questions as evidenced by documentation in EMR    attend all scheduled medical appointments: rescheduling mammogram and Cardiology as evidenced by provider documentation in EMR        continue to work with RN Care Manager and/or Social Worker to address care management and care coordination needs related to HLD as evidenced by adherence to CM Team  Scheduled appointments     through collaboration with RN Care manager, provider, and care team.   Interventions: Inter-disciplinary care team collaboration (see longitudinal plan of care) Evaluation of current treatment plan related to  self management and Foster's adherence to plan as established by provider Advised Foster to utilize medical transportation provided by [facility] [employer] [phone]-must call 2-3 days before your appointment and provide the address and phone number to the appointment. Discussed the importance in attending scheduled appointments and rescheduling missed appointment with Cardiology-Karen Foster will schedule Cardiology appointment tomorrow. Collaborated with PCP re: chest x-ray Provided therapeutic listening Discussed referral to LCSW for managing anxiety and emotions-Foster declined, she benefits from talking to the Congregational Nurse   COPD Interventions:  (Status:  New goal.) Long Term Goal Provided Foster with basic written and verbal COPD education on self care/management/and exacerbation prevention Provided instruction about proper use of medications used for management of COPD including inhalers Provided education about and advised Foster to utilize infection prevention strategies to reduce risk of respiratory infection Advised Foster to attend visit with Pulmonology and CT for lung cancer screening  Hyperlipidemia:  (  Status: Condition stable. Not addressed this visit.) Long Term Goal  Lab Results  Component Value Date   CHOL 137 02/14/2021   HDL 22 (L) 02/14/2021   LDLCALC 46 02/14/2021   TRIG 470 (H) 02/14/2021   CHOLHDL 6.2 (H) 02/14/2021     Medication review performed; medication list updated in electronic medical record.  Counseled on importance of regular laboratory monitoring as prescribed; Provided HLD educational materials; Reviewed exercise goals and target of 150 minutes per week; Assessed social determinant of health barriers;   Advised Foster to cut back on drinking Pepsi and increase her water intake Encouraged Foster to discuss any concerns or questions with provider  Foster Goals/Self-Care Activities: Take medications as prescribed   Attend all scheduled provider appointments Call pharmacy for medication refills 3-7 days in advance of running out of medications Attend church or other social activities Perform all self care activities independently  Call provider office for new concerns or questions  - adhere to prescribed diet: heart healthy - develop an exercise routine       Follow Up:  Foster agrees to Care Plan and Follow-up.  Plan: The Managed Medicaid care management team will reach out to the Foster again over the next 14 days.  Date/time of next scheduled RN care management/care coordination outreach:  05/14/21 @ 11:15am  Estanislado Emms RN, BSN [place]   [employer]

## 2021-04-30 DIAGNOSIS — Z419 Encounter for procedure for purposes other than remedying health state, unspecified: Secondary | ICD-10-CM | POA: Diagnosis not present

## 2021-04-30 NOTE — Congregational Nurse Program (Signed)
?  Dept: [phone] ? ? ?Congregational Nurse Program Note ? ?Date of Encounter: 04/30/2021 ?Client to center for assistance with clarifying upcoming appointments. She was confused about the chest xray and the CT. Both were ordered by her PCP at different times. Phone call place to [facility] and confirmed that both orders are in place and that client can have the CXR and the CT on the same day at the outpatient imaging. Client also made a cardiology appointment today, the appointment is May 10 at 2:40 pm. She states she does have transportation to her appointments. ? ?Past Medical History: ?Past Medical History:  ?Diagnosis Date  ? Allergy   ? Anxiety   ? Arthritis   ? Depression   ? GERD (gastroesophageal reflux disease)   ? Glaucoma   ? Heart murmur   ? Kidney stones   ? Migraines   ? Panic attack   ? Panic attack   ? ? ?Encounter Details: ? ? ? ? ?

## 2021-05-02 ENCOUNTER — Encounter: Payer: Self-pay | Admitting: Acute Care

## 2021-05-02 ENCOUNTER — Other Ambulatory Visit: Payer: Self-pay

## 2021-05-02 ENCOUNTER — Ambulatory Visit (INDEPENDENT_AMBULATORY_CARE_PROVIDER_SITE_OTHER): Payer: Medicaid Other | Admitting: Acute Care

## 2021-05-02 ENCOUNTER — Ambulatory Visit: Payer: Self-pay

## 2021-05-02 DIAGNOSIS — F17210 Nicotine dependence, cigarettes, uncomplicated: Secondary | ICD-10-CM

## 2021-05-02 NOTE — Patient Instructions (Signed)
Thank you for participating in the [facility] Lung Cancer Screening Program. °It was our pleasure to meet you today. °We will call you with the results of your scan within the next few days. °Your scan will be assigned a Lung RADS category score by the physicians reading the scans.  °This Lung RADS score determines follow up scanning.  °See below for description of categories, and follow up screening recommendations. °We will be in touch to schedule your follow up screening annually or based on recommendations of our providers. °We will fax a copy of your scan results to your Primary Care Physician, or the physician who referred you to the program, to ensure they have the results. °Please call the office if you have any questions or concerns regarding your scanning experience or results.  °Our office number is [phone]. °Please speak with Denise Phelps, RN. She is our Lung Cancer Screening RN. °If she is unavailable when you call, please have the office staff send her a message. She will return your call at her earliest convenience. °Remember, if your scan is normal, we will scan you annually as long as you continue to meet the criteria for the program. (Age 55-77, Current smoker or smoker who has quit within the last 15 years). °If you are a smoker, remember, quitting is the single most powerful action that you can take to decrease your risk of lung cancer and other pulmonary, breathing related problems. °We know quitting is hard, and we are here to help.  °Please let us know if there is anything we can do to help you meet your goal of quitting. °If you are a former smoker, congratulations. We are proud of you! Remain smoke free! °Remember you can refer friends or family members through the number above.  °We will screen them to make sure they meet criteria for the program. °Thank you for helping us take better care of you by participating in Lung Screening. ° °You can receive free nicotine replacement therapy  ( patches, gum or mints) by calling 1-800-QUIT NOW. Please call so we can get you on the path to becoming  a non-smoker. I know it is hard, but you can do this! ° °Lung RADS Categories: ° °Lung RADS 1: no nodules or definitely non-concerning nodules.  °Recommendation is for a repeat annual scan in 12 months. ° °Lung RADS 2:  nodules that are non-concerning in appearance and behavior with a very low likelihood of becoming an active cancer. °Recommendation is for a repeat annual scan in 12 months. ° °Lung RADS 3: nodules that are probably non-concerning , includes nodules with a low likelihood of becoming an active cancer.  Recommendation is for a 6-month repeat screening scan. Often noted after an upper respiratory illness. We will be in touch to make sure you have no questions, and to schedule your 6-month scan. ° °Lung RADS 4 A: nodules with concerning findings, recommendation is most often for a follow up scan in 3 months or additional testing based on our provider's assessment of the scan. We will be in touch to make sure you have no questions and to schedule the recommended 3 month follow up scan. ° °Lung RADS 4 B:  indicates findings that are concerning. We will be in touch with you to schedule additional diagnostic testing based on our provider's  assessment of the scan. ° °Hypnosis for smoking cessation  °[employer] °[phone] ° °Acupuncture for smoking cessation  °[facility] °[phone]  °

## 2021-05-02 NOTE — Progress Notes (Signed)
Virtual Visit via Telephone Note ? ?I connected with Karen Foster on 01/14/21 at  2:00 PM EST by telephone and verified that I am speaking with the correct person using two identifiers. ? ?Location: ?Patient: Home ?Provider: Working from home ?  ?I discussed the limitations, risks, security and privacy concerns of performing an evaluation and management service by telephone and the availability of in person appointments. I also discussed with the patient that there may be a patient responsible charge related to this service. The patient expressed understanding and agreed to proceed. ? ?Shared Decision Making Visit Lung Cancer Screening Program ?[phone]) ? ? ?Eligibility: ?Age 61 y.o. ?Pack Years Smoking History Calculation 50 ?(# packs/per year x # years smoked) ?Recent History of coughing up blood  no ?Unexplained weight loss? no ?( >Than 15 pounds within the last 6 months ) ?Prior History Lung / other cancer no ?(Diagnosis within the last 5 years already requiring surveillance chest CT Scans). ?Smoking Status Current Smoker ?Former Smokers: Years since quit: NA ? Quit Date: NA ? ?Visit Components: ?Discussion included one or more decision making aids. yes ?Discussion included risk/benefits of screening. yes ?Discussion included potential follow up diagnostic testing for abnormal scans. yes ?Discussion included meaning and risk of over diagnosis. yes ?Discussion included meaning and risk of False Positives. yes ?Discussion included meaning of total radiation exposure. yes ? ?Counseling Included: ?Importance of adherence to annual lung cancer LDCT screening. yes ?Impact of comorbidities on ability to participate in the program. yes ?Ability and willingness to under diagnostic treatment. yes ? ?Smoking Cessation Counseling: ?Current Smokers:  ?Discussed importance of smoking cessation. yes ?Information about tobacco cessation classes and interventions provided to patient. yes ?Patient provided with "ticket" for LDCT  Scan. yes ?Symptomatic Patient. yes ? Counseling(Intermediate counseling: > three minutes) 99406 ?Diagnosis Code: Tobacco Use Z72.0 ?Asymptomatic Patient no ? Counseling NA ?Former Smokers:  ?Discussed the importance of maintaining cigarette abstinence. yes ?Diagnosis Code: Personal History of Nicotine Dependence. Q94.503 ?Information about tobacco cessation classes and interventions provided to patient. Yes ?Patient provided with "ticket" for LDCT Scan. yes ?Written Order for Lung Cancer Screening with LDCT placed in Epic. Yes ?(CT Chest Lung Cancer Screening Low Dose W/O CM) [id number] ?Z12.2-Screening of respiratory organs ?Z87.891-Personal history of nicotine dependence ? ? ?I spent 25 minutes of face to face time with her discussing the risks and benefits of lung cancer screening. We viewed a power point together that explained in detail the above noted topics. We took the time to pause the power point at intervals to allow for questions to be asked and answered to ensure understanding. We discussed that she had taken the single most powerful action possible to decrease her risk of developing lung cancer when she quit smoking. I counseled her to remain smoke free, and to contact me if he ever had the desire to smoke again so that I can provide resources and tools to help support the effort to remain smoke free. We discussed the time and location of the scan, and that either  Abigail Miyamoto RN or I will call with the results within  24-48 hours of receiving them. She has my card and contact information in the event he needs to speak with me, in addition to a copy of the power point we reviewed as a resource. She verbalized understanding of all of the above and had no further questions upon leaving the office.  ? ? ? ?I explained to the patient that there has been a  high incidence of coronary artery disease noted on these exams. I explained that this is a non-gated exam therefore degree or severity cannot be  determined. This patient is on statin therapy. I have asked the patient to follow-up with their PCP regarding any incidental finding of coronary artery disease and management with diet or medication as they feel is clinically indicated. The patient verbalized understanding of the above and had no further questions. ? ? ?I spent 3 minutes counseling on smoking cessation and the health risks of continued tobacco abuse  ? ? ?Woodson Macha D. Harris, NP-C ?[facility] ?Personal contact information can be found on Amion  ?05/02/2021, 11:09 AM ? ? ? ? ? ? ? ? ? ?

## 2021-05-06 ENCOUNTER — Ambulatory Visit: Admission: RE | Admit: 2021-05-06 | Payer: Medicaid Other | Source: Ambulatory Visit

## 2021-05-13 ENCOUNTER — Other Ambulatory Visit: Payer: Self-pay | Admitting: Nurse Practitioner

## 2021-05-13 NOTE — Telephone Encounter (Signed)
Requested medication (s) are due for refill today: has used all refills early ? ?Requested medication (s) are on the active medication list: yes ? ?Last refill:  02/14/21 1 with 3 refills ? ?Future visit scheduled: 05/22/21 ? ?Notes to clinic:  Has used all refills for inhaler, may be filling early, please assess. ? ? ?  ? ?Requested Prescriptions  ?Pending Prescriptions Disp Refills  ? VENTOLIN HFA 108 (90 Base) MCG/ACT inhaler [Pharmacy Med Name: Ventolin HFA 108 (90 Base) MCG/ACT Inhalation Aerosol Solution] 18 g 0  ?  Sig: INHALE 2 PUFFS BY MOUTH EVERY 6 HOURS AS NEEDED FOR WHEEZING FOR SHORTNESS OF BREATH  ?  ? Pulmonology:  Beta Agonists 2 Failed - 05/13/2021  1:48 PM  ?  ?  Failed - Last Heart Rate in normal range  ?  Pulse Readings from Last 1 Encounters:  ?04/29/21 (!) 127  ?  ?  ?  ?  Passed - Last BP in normal range  ?  BP Readings from Last 1 Encounters:  ?04/11/21 118/61  ?  ?  ?  ?  Passed - Valid encounter within last 12 months  ?  Recent Outpatient Visits   ? ?      ? 1 month ago COPD mixed type (HCC)  ? [facility] Larae Grooms, NP  ? 2 months ago Ginette Pitman  ? [facility] Larae Grooms, NP  ? 2 months ago Annual physical exam  ? [facility] Larae Grooms, NP  ? 4 months ago COPD mixed type [facility])  ? [facility] Larae Grooms, NP  ? 5 months ago COPD mixed type [facility])  ? [facility] Larae Grooms, NP  ? ?  ?  ?Future Appointments   ? ?        ? In 1 week Larae Grooms, NP [facility]  ? In 3 weeks Gollan, Tollie Pizza, MD [facility]  ? ?  ? ?  ?  ?  ? ? ?

## 2021-05-14 ENCOUNTER — Other Ambulatory Visit: Payer: Self-pay

## 2021-05-14 ENCOUNTER — Other Ambulatory Visit: Payer: Self-pay | Admitting: *Deleted

## 2021-05-14 NOTE — Patient Instructions (Signed)
Visit Information ? ?Ms. Crate was given information about Medicaid Managed Care team care coordination services as a part of their [facility] Medicaid benefit. [name] verbally consented to engagement with the [facility] Managed Care team.  ? ?If you are experiencing a medical emergency, please call 911 or report to your local emergency department or urgent care.  ? ?If you have a non-emergency medical problem during routine business hours, please contact your provider's office and ask to speak with a nurse.  ? ?For questions related to your [facility] health plan, please call: [phone] or go here:[email or web address] ? ?If you would like to schedule transportation through your [facility] plan, please call the following number at least 2 days in advance of your appointment: [phone]. ? You can also use the MTM portal or MTM mobile app to manage your rides. For the portal, please go to [email or web address] ? ?Call the Behavioral Health Crisis Line at [phone], at any time, 24 hours a day, 7 days a week. If you are in danger or need immediate medical attention call 911. ? ?If you would like help to quit smoking, call 1-800-QUIT-NOW (424-758-0049) OR Espa?ol: 1-855-D?jelo-Ya 306-202-2865) o para m?s informaci?n haga clic aqu? or Text READY to 200-400 to register via text ? ?Ms. Nitsch, ? ? ?Please see education materials related to COPD and smoking provided by MyChart link. ? ?Patient verbalizes understanding of instructions and care plan provided today and agrees to view in MyChart. Active MyChart status confirmed with patient.   ? ?Telephone follow up appointment with Managed Medicaid care management team member scheduled for:06/16/21 @ 3:30pm ? ?Estanislado Emms RN, BSN ?[place]  [facility] ?RN Care Coordinator ? ? ?Following is a copy of your plan of care:  ?Care Plan : RN Care Manager Plan of Care  ?Updates made by Heidi Dach, RN since 05/14/2021 12:00 AM  ?   ? ?Problem: Chronic health managment needs related to COPD   ?  ? ?Long-Range Goal: Development of Plan of Care to address health managment needs related to COPD   ?Start Date: 01/27/2021  ?Expected End Date: 06/27/2021  ?Priority: High  ?Note:   ?Current Barriers:  ?Chronic Disease Management support and education needs related to HLD ?Ms. Snowden did not attend Lung CT and plans to call to reschedule missed appointment today. She has Cardiology scheduled on 06/09/21. She has all of her medications and reports her weight is maintaining at #165. She will procure permanent housing once determination for survivor benefits is final. Ms. Dreyfuss was at [facility] today for another appointment with Congregational Nurse. ? ?RNCM Clinical Goal(s):  ?Patient will verbalize understanding of plan for management of HLD as evidenced by patient verbalization and self monitoring activity ?take all medications exactly as prescribed and will call provider for medication related questions as evidenced by documentation in EMR    ?attend all scheduled medical appointments: rescheduling mammogram and Cardiology as evidenced by provider documentation in EMR        ?continue to work with Medical illustrator and/or Social Worker to address care management and care coordination needs related to HLD as evidenced by adherence to CM Team Scheduled appointments     through collaboration with Medical illustrator, provider, and care team.  ? ?Interventions: ?Inter-disciplinary care team collaboration (see longitudinal plan of care) ?Evaluation of current treatment plan related to  self management and patient's adherence to plan as established by provider ?Discussed the importance in attending scheduled appointments and rescheduling  Lung CT ?Provided therapeutic listening ?Reviewed medications, advised to discuss pain management with PCP and limit taking Goodys ? ? ?COPD Interventions:  (Status:  Goal on track:  NO.) Long Term Goal ?Provided education about  and advised patient to utilize infection prevention strategies to reduce risk of respiratory infection ?Assessed social determinant of health barriers ?Advised patient to reschedule and attend Lung CT ? ?Hyperlipidemia:  (Status: Condition stable. Not addressed this visit.) Long Term Goal  ?Lab Results  ?Component Value Date  ? CHOL 137 02/14/2021  ? HDL 22 (L) 02/14/2021  ? LDLCALC 46 02/14/2021  ? TRIG 470 (H) 02/14/2021  ? CHOLHDL 6.2 (H) 02/14/2021  ?  ? ?Medication review performed; medication list updated in electronic medical record.  ?Counseled on importance of regular laboratory monitoring as prescribed; ?Provided HLD educational materials; ?Reviewed exercise goals and target of 150 minutes per week; ?Assessed social determinant of health barriers;  ?Advised patient to cut back on drinking Pepsi and increase her water intake ?Encouraged patient to discuss any concerns or questions with provider ? ?Patient Goals/Self-Care Activities: ?Take medications as prescribed   ?Attend all scheduled provider appointments ?Call pharmacy for medication refills 3-7 days in advance of running out of medications ?Attend church or other social activities ?Perform all self care activities independently  ?Call provider office for new concerns or questions  ?- adhere to prescribed diet: heart healthy ?- develop an exercise routine ? ? ?  ?  ?

## 2021-05-14 NOTE — Patient Outreach (Signed)
Medicaid Managed Care   Nurse Care Manager Note  05/14/2021 Name:  [name] MRN:  [id number] DOB:  12-17-1960  Karen Foster is an 61 y.o. year old female who is a primary patient of Larae Grooms, NP.  The [facility] Managed Care Coordination team was consulted for assistance with:    COPD  [name] was given information about Medicaid Managed Care Coordination team services today. Karen Foster Patient agreed to services and verbal consent obtained.  Engaged with patient by telephone for follow up visit in response to provider referral for case management and/or care coordination services.   Assessments/Interventions:  Review of past medical history, allergies, medications, health status, including review of consultants reports, laboratory and other test data, was performed as part of comprehensive evaluation and provision of chronic care management services.  SDOH (Social Determinants of Health) assessments and interventions performed: SDOH Interventions    Flowsheet Row Most Recent Value  SDOH Interventions   Housing Interventions Intervention Not Indicated  [Waiting on disability determination for procuring permanent housing]       Care Plan  Allergies  Allergen Reactions   Bee Venom Anaphylaxis   Ibuprofen Anaphylaxis   Sulfa Antibiotics Anaphylaxis   Tomato Anaphylaxis and Nausea And Vomiting   Hydroxyzine Nausea Only    Medications Reviewed Today     Reviewed by Heidi Dach, RN (Registered Nurse) on 05/14/21 at 1126  Med List Status: <None>   Medication Order Taking? Sig Documenting Provider Last Dose Status Informant  albuterol (VENTOLIN HFA) 108 (90 Base) MCG/ACT inhaler [id number] Yes Inhale 2 puffs into the lungs every 6 (six) hours as needed for wheezing or shortness of breath. Larae Grooms, NP Taking Active   Aspirin-Acetaminophen (GOODYS BODY PAIN PO) [id number] Yes Take by mouth. [provider] Taking Active   benzonatate (TESSALON) 200  MG capsule [id number] Yes Take 1 capsule by mouth three times daily as needed for cough Larae Grooms, NP Taking Active   budesonide-formoterol [facility]) 160-4.5 MCG/ACT inhaler [id number] Yes Inhale 2 puffs into the lungs 2 (two) times daily. Larae Grooms, NP Taking Active   cyclobenzaprine (FLEXERIL) 5 MG tablet [id number] Yes Take 1 tablet (5 mg total) by mouth 3 (three) times daily as needed for muscle spasms. Larae Grooms, NP Taking Active   diclofenac sodium (VOLTAREN) 1 % GEL [id number] Yes Apply 2 g topically 4 (four) times daily as needed. Particia Nearing, [place] Taking Active   DULoxetine (CYMBALTA) 20 MG capsule [id number] Yes Take 1 capsule (20 mg total) by mouth daily. Larae Grooms, NP Taking Active   gabapentin (NEURONTIN) 300 MG capsule [id number] Yes Take 1 capsule by mouth 4 times daily Larae Grooms, NP Taking Active   montelukast (SINGULAIR) 10 MG tablet [id number] Yes Take 1 tablet (10 mg total) by mouth at bedtime. Larae Grooms, NP Taking Active   nystatin cream (MYCOSTATIN) [id number] No Apply 1 application topically 2 (two) times daily.  Patient not taking: Reported on 04/29/2021   Larae Grooms, NP Not Taking Active   pantoprazole (PROTONIX) 20 MG tablet [id number] Yes Take 1 tablet (20 mg total) by mouth 2 (two) times daily. Larae Grooms, NP Taking Active   predniSONE (DELTASONE) 10 MG tablet [id number] No Take 1 tablet (10 mg total) by mouth daily with breakfast. Take 6 today, 5 tomorrow, and decrease by 1 each day until course is complete.  Patient not taking: Reported on 05/14/2021   Larae Grooms, NP Not Taking Active  Med Note (Angelique Chevalier A   Tue Apr 29, 2021  3:53 PM) completed  QUEtiapine (SEROQUEL) 25 MG tablet [id number] Yes TAKE 1 TABLET BY MOUTH IN THE MORNING AND 2 TABLETS AT BEDTIME. CAN INCREASE TO 3 TABLETS AT BEDTIME AFTER 1 WEEK. Larae Grooms, NP Taking Active   rosuvastatin (CRESTOR) 10 MG tablet  [id number] Yes Take 1 tablet (10 mg total) by mouth daily. Larae Grooms, NP Taking Active   SPIRIVA RESPIMAT 2.5 MCG/ACT AERS [id number] Yes INHALE 2 SPRAY(S) BY MOUTH ONCE DAILY Larae Grooms, NP Taking Active             Patient Active Problem List   Diagnosis Date Noted   Other chronic pain 12/25/2020   Allergic rhinitis 05/26/2019   Urticaria 12/01/2018   COPD mixed type (HCC) 12/01/2018   Cigarette smoker 06/08/2018   Arthritis 02/19/2018   Hemorrhoids 02/18/2018   Reactive airway disease 02/18/2018   Migraines 12/12/2017   Deviated septum 12/03/2017   Heart murmur 12/03/2017   Depression 12/03/2017   Panic disorder     Conditions to be addressed/monitored per PCP order:  COPD  Care Plan : RN Care Manager Plan of Care  Updates made by Heidi Dach, RN since 05/14/2021 12:00 AM     Problem: Chronic health managment needs related to COPD      Long-Range Goal: Development of Plan of Care to address health managment needs related to COPD   Start Date: 01/27/2021  Expected End Date: 06/27/2021  Priority: High  Note:   Current Barriers:  Chronic Disease Management support and education needs related to HLD [name] did not attend Lung CT and plans to call to reschedule missed appointment today. She has Cardiology scheduled on 06/09/21. She has all of her medications and reports her weight is maintaining at #165. She will procure permanent housing once determination for survivor benefits is final. [name] was at [facility] today for another appointment with Congregational Nurse.  RNCM Clinical Goal(s):  Patient will verbalize understanding of plan for management of HLD as evidenced by patient verbalization and self monitoring activity take all medications exactly as prescribed and will call provider for medication related questions as evidenced by documentation in EMR    attend all scheduled medical appointments: rescheduling mammogram and Cardiology as  evidenced by provider documentation in EMR        continue to work with RN Care Manager and/or Social Worker to address care management and care coordination needs related to HLD as evidenced by adherence to CM Team Scheduled appointments     through collaboration with RN Care manager, provider, and care team.   Interventions: Inter-disciplinary care team collaboration (see longitudinal plan of care) Evaluation of current treatment plan related to  self management and patient's adherence to plan as established by provider Discussed the importance in attending scheduled appointments and rescheduling Lung CT Provided therapeutic listening Reviewed medications, advised to discuss pain management with PCP and limit taking Goodys   COPD Interventions:  (Status:  Goal on track:  NO.) Long Term Goal Provided education about and advised patient to utilize infection prevention strategies to reduce risk of respiratory infection Assessed social determinant of health barriers Advised patient to reschedule and attend Lung CT  Hyperlipidemia:  (Status: Condition stable. Not addressed this visit.) Long Term Goal  Lab Results  Component Value Date   CHOL 137 02/14/2021   HDL 22 (L) 02/14/2021   LDLCALC 46 02/14/2021   TRIG 470 (H) 02/14/2021  CHOLHDL 6.2 (H) 02/14/2021     Medication review performed; medication list updated in electronic medical record.  Counseled on importance of regular laboratory monitoring as prescribed; Provided HLD educational materials; Reviewed exercise goals and target of 150 minutes per week; Assessed social determinant of health barriers;  Advised patient to cut back on drinking Pepsi and increase her water intake Encouraged patient to discuss any concerns or questions with provider  Patient Goals/Self-Care Activities: Take medications as prescribed   Attend all scheduled provider appointments Call pharmacy for medication refills 3-7 days in advance of running out  of medications Attend church or other social activities Perform all self care activities independently  Call provider office for new concerns or questions  - adhere to prescribed diet: heart healthy - develop an exercise routine       Follow Up:  Patient agrees to Care Plan and Follow-up.  Plan: The Managed Medicaid care management team will reach out to the patient again over the next 30 days.  Date/time of next scheduled RN care management/care coordination outreach:  06/16/21 @ 3:30pm  Estanislado Emms RN, BSN [place]  [facility] RN Care Coordinator

## 2021-05-15 ENCOUNTER — Ambulatory Visit: Payer: Medicaid Other | Admitting: Nurse Practitioner

## 2021-05-22 ENCOUNTER — Other Ambulatory Visit: Payer: Self-pay | Admitting: Nurse Practitioner

## 2021-05-22 ENCOUNTER — Other Ambulatory Visit: Payer: Self-pay

## 2021-05-22 ENCOUNTER — Ambulatory Visit (INDEPENDENT_AMBULATORY_CARE_PROVIDER_SITE_OTHER): Payer: Medicaid Other | Admitting: Nurse Practitioner

## 2021-05-22 ENCOUNTER — Ambulatory Visit: Payer: Self-pay | Admitting: *Deleted

## 2021-05-22 ENCOUNTER — Encounter: Payer: Self-pay | Admitting: Nurse Practitioner

## 2021-05-22 VITALS — BP 118/64 | HR 90 | Temp 98.3°F | Wt 166.2 lb

## 2021-05-22 DIAGNOSIS — F17210 Nicotine dependence, cigarettes, uncomplicated: Secondary | ICD-10-CM

## 2021-05-22 DIAGNOSIS — F332 Major depressive disorder, recurrent severe without psychotic features: Secondary | ICD-10-CM | POA: Diagnosis not present

## 2021-05-22 DIAGNOSIS — E782 Mixed hyperlipidemia: Secondary | ICD-10-CM | POA: Diagnosis not present

## 2021-05-22 DIAGNOSIS — J449 Chronic obstructive pulmonary disease, unspecified: Secondary | ICD-10-CM

## 2021-05-22 MED ORDER — CYCLOBENZAPRINE HCL 10 MG PO TABS
10.0000 mg | ORAL_TABLET | Freq: Three times a day (TID) | ORAL | 0 refills | Status: DC | PRN
Start: 2021-05-22 — End: 2021-06-21

## 2021-05-22 MED ORDER — LURASIDONE HCL 20 MG PO TABS: 20.0000 mg | ORAL_TABLET | Freq: Every day | ORAL | 1 refills | Status: DC

## 2021-05-22 NOTE — Telephone Encounter (Signed)
Routing to provider for medication clarification.

## 2021-05-22 NOTE — Telephone Encounter (Signed)
Summary: Medication Question.  ? Pt stated saw PCP today and was given lurasidone (LATUDA) 20 MG TABS tablet pt stated was told to only take at night.  ?However, pt is unsure if she should continue to take QUEtiapine (SEROQUEL) 25 MG tablet.  ? ?Pt seeking clinical advise.   ?  ? ? ?Requesting gabapentin and albuterol inhaler refills be sent to pharmacy. Reports pharmacy does not have medications.  ?Patient would like to know if she can take latuda and seroquel 75 mg at night together. If not can she take seroquel 25- 50 mg during the day? If she can she will need refill on seroquel. If not can she get another medication to help her during the day. Please advise.  ? ? ? ?Reason for Disposition ? [1] Caller has NON-URGENT medicine question about med that PCP prescribed AND [2] triager unable to answer question ? ?Answer Assessment - Initial Assessment Questions ?1. NAME of MEDICATION: "What medicine are you calling about?" ?    Latuda and seroquel ?2. QUESTION: "What is your question?" (e.g., double dose of medicine, side effect) ?    Can patient take latuda and seroquel  75 mg together at night? Can patient take seroquel 25-50 mg during the day and latuda at night? If patient can take seroquel during the day patient will need refills. If she can not take seroquel during the day she will need something else to take during the day.  ?3. PRESCRIBING HCP: "Who prescribed it?" Reason: if prescribed by specialist, call should be referred to that group. ?    PCP ?4. SYMPTOMS: "Do you have any symptoms?" ?    Na  ?5. SEVERITY: If symptoms are present, ask "Are they mild, moderate or severe?" ?    na ?6. PREGNANCY:  "Is there any chance that you are pregnant?" "When was your last menstrual period?" ?    na ? ?Protocols used: Medication Question Call-A-AH ? ?

## 2021-05-22 NOTE — Progress Notes (Signed)
? ?BP 118/64   Pulse 90   Temp 98.3 ?F (36.8 ?C) (Oral)   Wt 166 lb 3.2 oz (75.4 kg)   SpO2 93%   BMI 27.91 kg/m?   ? ?Subjective:  ? ? Patient ID: Karen Foster, female    DOB: 06-Apr-1960, 61 y.o.   MRN: [id number] ? ?HPI: ?[name] is a 61 y.o. female ? ?Chief Complaint  ?Patient presents with  ? COPD  ? Depression  ? ? ?Patient states she continues to have hip pain daily.  Wondering if she can increase the flexeril dose from $RemoveB'5mg'HPPlWsBw$  to $R'10mg'yQ$ .   ? ?COPD ?COPD status: Ongoing ?Satisfied with current treatment?: no ?Oxygen use: no ?Dyspnea frequency: everyday ?Cough frequency: yes ?Rescue inhaler frequency:  almost everyday (5/7 days) ?Limitation of activity: yes ?Productive cough: no ?Last Spirometry:  ?Pneumovax: Up to Date ?Influenza: Up to Date ? ?DEPRESSION/ANXIETY ?Patient states she has continued to gain weight.  Her biggest anxiety is over her lack of sleep.  States she likes the [place] because it helps her sleep.   ? ? ?Relevant past medical, surgical, family and social history reviewed and updated as indicated. Interim medical history since our last visit reviewed. ?Allergies and medications reviewed and updated. ? ?Review of Systems  ?Constitutional:  Positive for unexpected weight change.  ?Respiratory:  Positive for cough and shortness of breath.   ? ?Per HPI unless specifically indicated above ? ?   ?Objective:  ?  ?BP 118/64   Pulse 90   Temp 98.3 ?F (36.8 ?C) (Oral)   Wt 166 lb 3.2 oz (75.4 kg)   SpO2 93%   BMI 27.91 kg/m?   ?Wt Readings from Last 3 Encounters:  ?05/22/21 166 lb 3.2 oz (75.4 kg)  ?04/11/21 160 lb 9.6 oz (72.8 kg)  ?02/28/21 159 lb (72.1 kg)  ?  ?Physical Exam ?Vitals and nursing note reviewed.  ?Constitutional:   ?   General: She is not in acute distress. ?   Appearance: Normal appearance. She is normal weight. She is not ill-appearing, toxic-appearing or diaphoretic.  ?HENT:  ?   Head: Normocephalic.  ?   Right Ear: External ear normal.  ?   Left Ear: External ear normal.  ?    Nose: Nose normal.  ?   Mouth/Throat:  ?   Mouth: Mucous membranes are moist.  ?   Pharynx: Oropharynx is clear.  ?Eyes:  ?   General:     ?   Right eye: No discharge.     ?   Left eye: No discharge.  ?   Extraocular Movements: Extraocular movements intact.  ?   Conjunctiva/sclera: Conjunctivae normal.  ?   Pupils: Pupils are equal, round, and reactive to light.  ?Cardiovascular:  ?   Rate and Rhythm: Normal rate and regular rhythm.  ?   Heart sounds: No murmur heard. ?Pulmonary:  ?   Effort: Pulmonary effort is normal. No respiratory distress.  ?   Breath sounds: No wheezing or rales.  ?Musculoskeletal:  ?   Cervical back: Normal range of motion and neck supple.  ?Skin: ?   General: Skin is warm and dry.  ?   Capillary Refill: Capillary refill takes less than 2 seconds.  ?Neurological:  ?   General: No focal deficit present.  ?   Mental Status: She is alert and oriented to person, place, and time. Mental status is at baseline.  ?Psychiatric:     ?   Mood and Affect: Mood  normal.     ?   Behavior: Behavior normal.     ?   Thought Content: Thought content normal.     ?   Judgment: Judgment normal.  ? ? ?Results for orders placed or performed in visit on 02/14/21  ?CBC with Differential/Platelet  ?Result Value Ref Range  ? WBC 7.2 3.4 - 10.8 x10E3/uL  ? RBC 4.21 3.77 - 5.28 x10E6/uL  ? Hemoglobin 14.6 11.1 - 15.9 g/dL  ? Hematocrit 43.0 34.0 - 46.6 %  ? MCV 102 (H) 79 - 97 fL  ? MCH 34.7 (H) 26.6 - 33.0 pg  ? MCHC 34.0 31.5 - 35.7 g/dL  ? RDW 12.4 11.7 - 15.4 %  ? Platelets 207 150 - 450 x10E3/uL  ? Neutrophils 57 Not Estab. %  ? Lymphs 33 Not Estab. %  ? Monocytes 9 Not Estab. %  ? Eos 1 Not Estab. %  ? Basos 0 Not Estab. %  ? Neutrophils Absolute 4.0 1.4 - 7.0 x10E3/uL  ? Lymphocytes Absolute 2.4 0.7 - 3.1 x10E3/uL  ? Monocytes Absolute 0.7 0.1 - 0.9 x10E3/uL  ? EOS (ABSOLUTE) 0.1 0.0 - 0.4 x10E3/uL  ? Basophils Absolute 0.0 0.0 - 0.2 x10E3/uL  ? Immature Granulocytes 0 Not Estab. %  ? Immature Grans (Abs) 0.0 0.0  - 0.1 x10E3/uL  ?Comprehensive metabolic panel  ?Result Value Ref Range  ? Glucose 95 70 - 99 mg/dL  ? BUN 15 6 - 24 mg/dL  ? Creatinine, Ser 0.55 (L) 0.57 - 1.00 mg/dL  ? eGFR 106 >59 mL/min/1.73  ? BUN/Creatinine Ratio 27 (H) 9 - 23  ? Sodium 145 (H) 134 - 144 mmol/L  ? Potassium 4.4 3.5 - 5.2 mmol/L  ? Chloride 113 (H) 96 - 106 mmol/L  ? CO2 18 (L) 20 - 29 mmol/L  ? Calcium 8.1 (L) 8.7 - 10.2 mg/dL  ? Total Protein 5.4 (L) 6.0 - 8.5 g/dL  ? Albumin 3.5 (L) 3.8 - 4.9 g/dL  ? Globulin, Total 1.9 1.5 - 4.5 g/dL  ? Albumin/Globulin Ratio 1.8 1.2 - 2.2  ? Bilirubin Total <0.2 0.0 - 1.2 mg/dL  ? Alkaline Phosphatase 84 44 - 121 IU/L  ? AST 13 0 - 40 IU/L  ? ALT 12 0 - 32 IU/L  ?Lipid panel  ?Result Value Ref Range  ? Cholesterol, Total 137 100 - 199 mg/dL  ? Triglycerides 470 (H) 0 - 149 mg/dL  ? HDL 22 (L) >39 mg/dL  ? VLDL Cholesterol Cal 69 (H) 5 - 40 mg/dL  ? LDL Chol Calc (NIH) 46 0 - 99 mg/dL  ? Chol/HDL Ratio 6.2 (H) 0.0 - 4.4 ratio  ?TSH  ?Result Value Ref Range  ? TSH 0.854 0.450 - 4.500 uIU/mL  ?Urinalysis, Routine w reflex microscopic  ?Result Value Ref Range  ? Specific Gravity, UA      >=1.030 (A) 1.005 - 1.030  ? pH, UA 5.5 5.0 - 7.5  ? Color, UA Yellow Yellow  ? Appearance Ur Clear Clear  ? Leukocytes,UA Negative Negative  ? Protein,UA Negative Negative/Trace  ? Glucose, UA Negative Negative  ? Ketones, UA Negative Negative  ? RBC, UA Negative Negative  ? Bilirubin, UA Negative Negative  ? Urobilinogen, Ur 1.0 0.2 - 1.0 mg/dL  ? Nitrite, UA Negative Negative  ? Microscopic Examination Comment   ?Cytology - PAP  ?Result Value Ref Range  ? Adequacy    ?  Satisfactory for evaluation; transformation zone component ABSENT.  ? Diagnosis    ?  -  Negative for intraepithelial lesion or malignancy (NILM)  ? ?   ?Assessment & Plan:  ? ?Problem List Items Addressed This Visit   ? ?  ? Respiratory  ? COPD mixed type ([place]) - Primary  ?  Chronic. Ongoing.  She has decreased from 1/2 ppd to 1/4 ppd.  She is going to  have her Chest CT and chest xray done next week.  Continue with use of inhalers.  Follow up in 3 months for reevaluation. ?  ?  ? Relevant Orders  ? Comp Met (CMET)  ?  ? Other  ? Depression  ?  Chronic. Was doing well with Seroquel however, she has gained significant weight being on the medication.  Will change to Latuda to see if weight gain stops.  Discussed how to properly take medication.  Discussed side effects and benefits of medication during visit. Follow up in 3 months for reevaluation. ?  ?  ? Cigarette smoker  ?  Chronic. Has cut down from 1/2ppd to 1/4ppd.  Praised for decreasing the amount she is smoking.  Encouraged her to continue to cut back on cigarette use.  Follow up in 3 months for reevaluation. ?  ?  ? ?Other Visit Diagnoses   ? ? Mixed hyperlipidemia      ? Relevant Orders  ? Lipid Profile  ? ?  ?  ? ?Follow up plan: ?Return in about 3 months (around 08/22/2021) for Depression/Anxiety FU, COPD. ? ? ? ? ? ?

## 2021-05-22 NOTE — Assessment & Plan Note (Signed)
Chronic. Was doing well with Seroquel however, she has gained significant weight being on the medication.  Will change to Latuda to see if weight gain stops.  Discussed how to properly take medication.  Discussed side effects and benefits of medication during visit. Follow up in 3 months for reevaluation. ?

## 2021-05-22 NOTE — Assessment & Plan Note (Signed)
Chronic. Has cut down from 1/2ppd to 1/4ppd.  Praised for decreasing the amount she is smoking.  Encouraged her to continue to cut back on cigarette use.  Follow up in 3 months for reevaluation. ?

## 2021-05-22 NOTE — Assessment & Plan Note (Signed)
Chronic. Ongoing.  She has decreased from 1/2 ppd to 1/4 ppd.  She is going to have her Chest CT and chest xray done next week.  Continue with use of inhalers.  Follow up in 3 months for reevaluation. ?

## 2021-05-23 ENCOUNTER — Telehealth: Payer: Self-pay | Admitting: Nurse Practitioner

## 2021-05-23 ENCOUNTER — Ambulatory Visit: Payer: Self-pay

## 2021-05-23 ENCOUNTER — Other Ambulatory Visit: Payer: Self-pay | Admitting: Nurse Practitioner

## 2021-05-23 DIAGNOSIS — J4540 Moderate persistent asthma, uncomplicated: Secondary | ICD-10-CM

## 2021-05-23 MED ORDER — QUETIAPINE FUMARATE 25 MG PO TABS: 25.0000 mg | ORAL_TABLET | Freq: Every day | ORAL | 1 refills | Status: DC

## 2021-05-23 MED ORDER — ALBUTEROL SULFATE HFA 108 (90 BASE) MCG/ACT IN AERS
2.0000 | INHALATION_SPRAY | Freq: Four times a day (QID) | RESPIRATORY_TRACT | 3 refills | Status: DC | PRN
Start: 2021-05-23 — End: 2021-08-25

## 2021-05-23 MED ORDER — GABAPENTIN 300 MG PO CAPS: ORAL_CAPSULE | ORAL | 1 refills | Status: DC

## 2021-05-23 NOTE — Telephone Encounter (Signed)
?  Chief Complaint: Needs medications for COPD refilled ?Symptoms: SOB last night ?Frequency: ongoing ?Pertinent Negatives: Patient denies Current SOB ?Disposition: [] ED /[] Urgent Care (no appt availability in office) / [] Appointment(In office/virtual)/ []  [place] [facility]/ [] Home Care/ [] Refused Recommended Disposition /[] [place] [facility]/ [x]  Follow-up with PCP ?Additional Notes: Pt needs medications refilled ASAP. Pt is out. ?Reason for Disposition ? [1] MILD longstanding difficulty breathing AND [2]  SAME as normal ? ?Answer Assessment - Initial Assessment Questions ?1. RESPIRATORY STATUS: "Describe your breathing?" (e.g., wheezing, shortness of breath, unable to speak, severe coughing)  ?    Last night was SOB ?2. ONSET: "When did this breathing problem begin?"  ?    Last night ?3. PATTERN "Does the difficult breathing come and go, or has it been constant since it started?"  ?    Come and go - ongoing ?4. SEVERITY: "How bad is your breathing?" (e.g., mild, moderate, severe)  ?  - MILD: No SOB at rest, mild SOB with walking, speaks normally in sentences, can lie down, no retractions, pulse < 100.  ?  - MODERATE: SOB at rest, SOB with minimal exertion and prefers to sit, cannot lie down flat, speaks in phrases, mild retractions, audible wheezing, pulse 100-120.  ?  - SEVERE: Very SOB at rest, speaks in single words, struggling to breathe, sitting hunched forward, retractions, pulse > 120  ?    moderate ?5. RECURRENT SYMPTOM: "Have you had difficulty breathing before?" If Yes, ask: "When was the last time?" and "What happened that time?"  ?    yes ?6. CARDIAC HISTORY: "Do you have any history of heart disease?" (e.g., heart attack, angina, bypass surgery, angioplasty)  ?     ?7. LUNG HISTORY: "Do you have any history of lung disease?"  (e.g., pulmonary embolus, asthma, emphysema) ?    COPD,  ?8. CAUSE: "What do you think is causing the breathing problem?"  ?    COPD  ?9. OTHER SYMPTOMS: "Do you have  any other symptoms? (e.g., dizziness, runny nose, cough, chest pain, fever) ?     ?10. O2 SATURATION MONITOR:  "Do you use an oxygen saturation monitor (pulse oximeter) at home?" If Yes, "What is your reading (oxygen level) today?" "What is your usual oxygen saturation reading?" (e.g., 95%) ?       ?11. PREGNANCY: "Is there any chance you are pregnant?" "When was your last menstrual period?" ?       ?12. TRAVEL: "Have you traveled out of the country in the last month?" (e.g., travel history, exposures) ? ?Protocols used: Breathing Difficulty-A-AH ? ?

## 2021-05-23 NOTE — Telephone Encounter (Signed)
Called and LVM asking for patient to please return my call.  ? ?OK for [facility] to give message to the patient if she calls back.  ?

## 2021-05-23 NOTE — Addendum Note (Signed)
Addended by: Larae Grooms on: 05/23/2021 11:44 AM ? ? Modules accepted: Orders ? ?

## 2021-05-23 NOTE — Telephone Encounter (Signed)
Patient returned call. Advised her of medications per Clydie Braun. Patient states she does not want to take a different medication if she has to take it at bedtime. She would like to just stay with the Seroquel since the Latuda would be at bedtime as well.  ?

## 2021-05-23 NOTE — Telephone Encounter (Signed)
As discussed during the visit, her weight gain is likely related to the Seroquel and continuing on the medication will likely result in more weight gain.  I have sent in the refill of Seroquel.  At this time, if she would like to discuss this further she will need another appointment. ?

## 2021-05-23 NOTE — Telephone Encounter (Signed)
Gabapentin and Albuterol sent to the pharmacy.   ? ?She needs to stop the Seroquel to start the Latuda.  They can not be taken together.  ?

## 2021-05-23 NOTE — Telephone Encounter (Signed)
Pt is calling back to follow up on medication refill.  ?Pt is completely out of Albuterol Sulfate 108 (90 Base) MCG/ACT. ? ?Please advise. ?

## 2021-05-23 NOTE — Addendum Note (Signed)
Addended by: Jon Billings on: 05/23/2021 10:01 AM ? ? Modules accepted: Orders ? ?

## 2021-05-23 NOTE — Telephone Encounter (Signed)
Copied from [place] [id number]. Topic: General - Other ?>> May 23, 2021 10:38 AM Karen Foster A wrote: ?Reason for CRM: The patient has called to request a prescription for Dexamethasone mouthwash  ? ?The patient was previously prescribed a different mouthwash that was not covered by their insurance company  ? ?The patient has ben told that Dexamethasone can be covered  ? ?The patient would like the prescription submitted to  ? ?[place] ([place]), [address] ([place]) [place] [address] ?Phone: [phone] Fax: [phone] ?Hours: Not open 24 hours ? ?Please contact further if needed ?

## 2021-05-23 NOTE — Telephone Encounter (Signed)
Copied from CRM [id number]. Topic: Quick Communication - Rx Refill/Question ?>> May 23, 2021  3:52 PM Karen Foster, Karen Foster wrote: ?Medication: benzonatate (TESSALON) 200 MG capsule [[id number]]  ? ?Has the patient contacted their pharmacy? Yes.   ?(Agent: If no, request that the patient contact the pharmacy for the refill. If patient does not wish to contact the pharmacy document the reason why and proceed with request.) ?(Agent: If yes, when and what did the pharmacy advise?) ? ?Preferred Pharmacy (with phone number or street name): [id number] - [place] ([place]), [place] - [address] ?[address] [place] ([place] [address] ?Phone: [phone] Fax: [phone] ?Hours: Not open 24 hours ? ?Has the patient been seen for an appointment in the last year OR does the patient have an upcoming appointment? Yes.   ? ?Agent: Please be advised that RX refills may take up to 3 business days. We ask that you follow-up with your pharmacy. ?

## 2021-05-26 ENCOUNTER — Ambulatory Visit: Payer: Self-pay

## 2021-05-26 NOTE — Telephone Encounter (Signed)
Pt has questions on medication QUEtiapine (SEROQUEL) 25 MG tablet that she would like to discuss. Pt stated directions on how to take medication was changed and she would like to speak with a nurse about this.  ? ?Pt is unsure if this was a mistake or not.  ? ?Seeking clinical advise.  ?  ?  ?  ?  ? ? ?Pt. States she has been taking Seroquel 1 in the morning and 3 at bedtime. Last refill states 1 daily. Please advise. ? ?Answer Assessment - Initial Assessment Questions ?1. DRUG NAME: "What medicine do you need to have refilled?" ?    Seroquel - pt. Taking 1 in morning and 3 at bedtime. Chart states 1 daily. ?2. REFILLS REMAINING: "How many refills are remaining?" (Note: The label on the medicine or pill bottle will show how many refills are remaining. If there are no refills remaining, then a renewal may be needed.) ?    0 ?3. EXPIRATION DATE: "What is the expiration date?" (Note: The label states when the prescription will expire, and thus can no longer be refilled.) ?    N/a ?4. PRESCRIBING HCP: "Who prescribed it?" Reason: If prescribed by specialist, call should be referred to that group. ?    [place] ?5. SYMPTOMS: "Do you have any symptoms?" ?    N/a ?6. PREGNANCY: "Is there any chance that you are pregnant?" "When was your last menstrual period?" ?    No ? ?Protocols used: Medication Refill and Renewal Call-A-AH ? ?

## 2021-05-26 NOTE — Telephone Encounter (Signed)
Routing to provider. Please advise on medication directions.  ?

## 2021-05-26 NOTE — Telephone Encounter (Signed)
Requested Prescriptions  ?Pending Prescriptions Disp Refills  ?? benzonatate (TESSALON) 100 MG capsule [Pharmacy Med Name: Benzonatate 100 MG Oral Capsule] 90 capsule 0  ?  Sig: TAKE 2 CAPSULES BY MOUTH THREE TIMES DAILY AS NEEDED FOR COUGH  ?  ? Ear, Nose, and Throat:  Antitussives/Expectorants Passed - 05/23/2021 11:15 AM  ?  ?  Passed - Valid encounter within last 12 months  ?  Recent Outpatient Visits   ?      ? 4 days ago COPD mixed type (HCC)  ? [facility] Larae Grooms, NP  ? 1 month ago COPD mixed type [facility])  ? [facility] Larae Grooms, NP  ? 2 months ago Ginette Pitman  ? [facility] Larae Grooms, NP  ? 3 months ago Annual physical exam  ? [facility] Larae Grooms, NP  ? 5 months ago COPD mixed type [facility])  ? [facility] Larae Grooms, NP  ?  ?  ?Future Appointments   ?        ? In 2 weeks Gollan, Tollie Pizza, MD [facility], [facility]  ? In 2 months Larae Grooms, NP [facility], [facility]  ?  ? ?  ?  ?  ? ?

## 2021-05-26 NOTE — Telephone Encounter (Signed)
Please see medication request below.

## 2021-05-26 NOTE — Telephone Encounter (Signed)
Requested medications are due for refill today.  no ? ?Requested medications are on the active medications list.  yes ? ?Last refill. All 3 refilled 05/23/2021 ? ?Future visit scheduled.   yes ? ?Notes to clinic.  All 3 meds were refilled 05/23/2021 ? ? ? ?Requested Prescriptions  ?Pending Prescriptions Disp Refills  ? gabapentin (NEURONTIN) 300 MG capsule [Pharmacy Med Name: Gabapentin 300 MG Oral Capsule] 120 capsule 0  ?  Sig: Take 1 capsule by mouth 4 times daily  ?  ? Neurology: Anticonvulsants - gabapentin Failed - 05/23/2021  9:40 AM  ?  ?  Failed - Cr in normal range and within 360 days  ?  Creatinine, Ser  ?Date Value Ref Range Status  ?02/14/2021 0.55 (L) 0.57 - 1.00 mg/dL Final  ?  ?  ?  ?  Passed - Completed PHQ-2 or PHQ-9 in the last 360 days  ?  ?  Passed - Valid encounter within last 12 months  ?  Recent Outpatient Visits   ? ?      ? 4 days ago COPD mixed type ([place])  ? [place], NP  ? 1 month ago COPD mixed type [facility])  ? [place], NP  ? 2 months ago Ritta Slot  ? [place], NP  ? 3 months ago Annual physical exam  ? [place], NP  ? 5 months ago COPD mixed type [facility])  ? [facility] Jon Billings, NP  ? ?  ?  ?Future Appointments   ? ?        ? In 2 weeks Gollan, Kathlene November, MD [facility], [facility]  ? In 2 months Jon Billings, NP [facility], [facility]  ? ?  ? ?  ?  ?  ? VENTOLIN HFA 108 (90 Base) MCG/ACT inhaler [Pharmacy Med Name: Ventolin HFA 108 (90 Base) MCG/ACT Inhalation Aerosol Solution] 18 g 0  ?  Sig: INHALE 2 PUFFS BY MOUTH EVERY 6 HOURS AS NEEDED FOR WHEEZING FOR SHORTNESS OF BREATH  ?  ? Pulmonology:  Beta Agonists 2 Passed - 05/23/2021  9:40 AM  ?  ?  Passed - Last BP in normal range  ?  BP Readings from Last 1 Encounters:  ?05/22/21 118/64  ?  ?  ?  ?  Passed - Last Heart Rate in normal range  ?  Pulse Readings from  Last 1 Encounters:  ?05/22/21 90  ?  ?  ?  ?  Passed - Valid encounter within last 12 months  ?  Recent Outpatient Visits   ? ?      ? 4 days ago COPD mixed type ([place])  ? [place], NP  ? 1 month ago COPD mixed type [facility])  ? [place], NP  ? 2 months ago Ritta Slot  ? [place], NP  ? 3 months ago Annual physical exam  ? [place], NP  ? 5 months ago COPD mixed type [facility])  ? [facility] Jon Billings, NP  ? ?  ?  ?Future Appointments   ? ?        ? In 2 weeks Gollan, Kathlene November, MD [facility], [facility]  ? In 2 months Jon Billings, NP [facility], [facility]  ? ?  ? ?  ?  ?  ? QUEtiapine (SEROQUEL) 25 MG tablet [  Pharmacy Med Name: QUEtiapine Fumarate 25 MG Oral Tablet] 120 tablet 0  ?  Sig: TAKE 1 TABLET BY MOUTH IN THE MORNING AND 2 TABLETS AT BEDTIME. CAN INCREASE TO 3 TABLETS AT BEDTIME AFTER 1 WEEK.  ?  ? Not Delegated - Psychiatry:  Antipsychotics - Second Generation (Atypical) - quetiapine Failed - 05/23/2021  9:40 AM  ?  ?  Failed - This refill cannot be delegated  ?  ?  Failed - Lipid Panel in normal range within the last 12 months  ?  Cholesterol, Total  ?Date Value Ref Range Status  ?02/14/2021 137 100 - 199 mg/dL Final  ? ?LDL Chol Calc (NIH)  ?Date Value Ref Range Status  ?02/14/2021 46 0 - 99 mg/dL Final  ? ?HDL  ?Date Value Ref Range Status  ?02/14/2021 22 (L) >39 mg/dL Final  ? ?Triglycerides  ?Date Value Ref Range Status  ?02/14/2021 470 (H) 0 - 149 mg/dL Final  ? ?  ?  ?  Passed - TSH in normal range and within 360 days  ?  TSH  ?Date Value Ref Range Status  ?02/14/2021 0.854 0.450 - 4.500 uIU/mL Final  ?  ?  ?  ?  Passed - Completed PHQ-2 or PHQ-9 in the last 360 days  ?  ?  Passed - Last BP in normal range  ?  BP Readings from Last 1 Encounters:  ?05/22/21 118/64  ?  ?  ?  ?  Passed - Last Heart Rate in normal range  ?  Pulse  Readings from Last 1 Encounters:  ?05/22/21 90  ?  ?  ?  ?  Passed - Valid encounter within last 6 months  ?  Recent Outpatient Visits   ? ?      ? 4 days ago COPD mixed type ([place])  ? [place], NP  ? 1 month ago COPD mixed type [facility])  ? [place], NP  ? 2 months ago Ritta Slot  ? [place], NP  ? 3 months ago Annual physical exam  ? [place], NP  ? 5 months ago COPD mixed type [facility])  ? [facility] Jon Billings, NP  ? ?  ?  ?Future Appointments   ? ?        ? In 2 weeks Gollan, Kathlene November, MD [facility], [facility]  ? In 2 months Jon Billings, NP [facility], [facility]  ? ?  ? ?  ?  ?  Passed - CBC within normal limits and completed in the last 12 months  ?  WBC  ?Date Value Ref Range Status  ?02/14/2021 7.2 3.4 - 10.8 x10E3/uL Final  ?02/10/2016 5.9 3.6 - 11.0 K/uL Final  ? ?RBC  ?Date Value Ref Range Status  ?02/14/2021 4.21 3.77 - 5.28 x10E6/uL Final  ?02/10/2016 4.00 3.80 - 5.20 MIL/uL Final  ? ?Hemoglobin  ?Date Value Ref Range Status  ?02/14/2021 14.6 11.1 - 15.9 g/dL Final  ? ?Hematocrit  ?Date Value Ref Range Status  ?02/14/2021 43.0 34.0 - 46.6 % Final  ? ?MCHC  ?Date Value Ref Range Status  ?02/14/2021 34.0 31.5 - 35.7 g/dL Final  ?02/10/2016 34.0 32.0 - 36.0 g/dL Final  ? ?MCH  ?Date Value Ref Range Status  ?02/14/2021 34.7 (H) 26.6 - 33.0 pg Final  ?02/10/2016 35.1 (H) 26.0 - 34.0 pg Final  ? ?MCV  ?Date Value Ref Range Status  ?02/14/2021  102 (H) 79 - 97 fL Final  ? ?No results found for: PLTCOUNTKUC, LABPLAT, [place] ?RDW  ?Date Value Ref Range Status  ?02/14/2021 12.4 11.7 - 15.4 % Final  ? ?  ?  ?  Passed - CMP within normal limits and completed in the last 12 months  ?  Albumin  ?Date Value Ref Range Status  ?02/14/2021 3.5 (L) 3.8 - 4.9 g/dL Final  ? ?Alkaline Phosphatase  ?Date Value Ref Range Status  ?02/14/2021 84 44 - 121  IU/L Final  ? ?ALT  ?Date Value Ref Range Status  ?02/14/2021 12 0 - 32 IU/L Final  ? ?AST  ?Date Value Ref Range Status  ?02/14/2021 13 0 - 40 IU/L Final  ? ?BUN  ?Date Value Ref Range Status  ?02/14/2021 15 6 - 24 mg/dL Final  ? ?Calcium  ?Date Value Ref Range Status  ?02/14/2021 8.1 (L) 8.7 - 10.2 mg/dL Final  ? ?CO2  ?Date Value Ref Range Status  ?02/14/2021 18 (L) 20 - 29 mmol/L Final  ? ?Creatinine, Ser  ?Date Value Ref Range Status  ?02/14/2021 0.55 (L) 0.57 - 1.00 mg/dL Final  ? ?Glucose  ?Date Value Ref Range Status  ?02/14/2021 95 70 - 99 mg/dL Final  ? ?Glucose, Bld  ?Date Value Ref Range Status  ?02/10/2016 107 (H) 65 - 99 mg/dL Final  ? ?Potassium  ?Date Value Ref Range Status  ?02/14/2021 4.4 3.5 - 5.2 mmol/L Final  ? ?Sodium  ?Date Value Ref Range Status  ?02/14/2021 145 (H) 134 - 144 mmol/L Final  ? ?Bilirubin Total  ?Date Value Ref Range Status  ?02/14/2021 <0.2 0.0 - 1.2 mg/dL Final  ? ?Protein, ur  ?Date Value Ref Range Status  ?02/10/2016 NEGATIVE NEGATIVE mg/dL Final  ? ?Protein,UA  ?Date Value Ref Range Status  ?02/14/2021 Negative Negative/Trace Final  ? ?Total Protein  ?Date Value Ref Range Status  ?02/14/2021 5.4 (L) 6.0 - 8.5 g/dL Final  ? ?GFR calc Af Amer  ?Date Value Ref Range Status  ?02/18/2018 94 >59 mL/min/1.73 Final  ? ?eGFR  ?Date Value Ref Range Status  ?02/14/2021 106 >59 mL/min/1.73 Final  ? ?GFR calc non Af Amer  ?Date Value Ref Range Status  ?02/18/2018 81 >59 mL/min/1.73 Final  ? ?  ?  ?  ?  ?

## 2021-05-26 NOTE — Telephone Encounter (Signed)
Requested medications are due for refill today.  no ? ?Requested medications are on the active medications list.  yes ? ?Last refill. 05/23/2021 - all 3  ? ?Future visit scheduled.   yes ? ?Notes to clinic.  All 3 medications were refilled 0/63/0160 - duplicate request. ? ? ? ?Requested Prescriptions  ?Pending Prescriptions Disp Refills  ? gabapentin (NEURONTIN) 300 MG capsule [Pharmacy Med Name: Gabapentin 300 MG Oral Capsule] 120 capsule 0  ?  Sig: Take 1 capsule by mouth 4 times daily  ?  ? Neurology: Anticonvulsants - gabapentin Failed - 05/23/2021  9:40 AM  ?  ?  Failed - Cr in normal range and within 360 days  ?  Creatinine, Ser  ?Date Value Ref Range Status  ?02/14/2021 0.55 (L) 0.57 - 1.00 mg/dL Final  ?  ?  ?  ?  Passed - Completed PHQ-2 or PHQ-9 in the last 360 days  ?  ?  Passed - Valid encounter within last 12 months  ?  Recent Outpatient Visits   ? ?      ? 4 days ago COPD mixed type ([place])  ? [place], NP  ? 1 month ago COPD mixed type [facility])  ? [place], NP  ? 2 months ago Ritta Slot  ? [place], NP  ? 3 months ago Annual physical exam  ? [place], NP  ? 5 months ago COPD mixed type [facility])  ? [facility] Jon Billings, NP  ? ?  ?  ?Future Appointments   ? ?        ? In 2 weeks Gollan, Kathlene November, MD [facility], [facility]  ? In 2 months Jon Billings, NP [facility], [facility]  ? ?  ? ?  ?  ?  ? VENTOLIN HFA 108 (90 Base) MCG/ACT inhaler [Pharmacy Med Name: Ventolin HFA 108 (90 Base) MCG/ACT Inhalation Aerosol Solution] 18 g 0  ?  Sig: INHALE 2 PUFFS BY MOUTH EVERY 6 HOURS AS NEEDED FOR WHEEZING FOR SHORTNESS OF BREATH  ?  ? Pulmonology:  Beta Agonists 2 Passed - 05/23/2021  9:40 AM  ?  ?  Passed - Last BP in normal range  ?  BP Readings from Last 1 Encounters:  ?05/22/21 118/64  ?  ?  ?  ?  Passed - Last Heart Rate in normal range  ?   Pulse Readings from Last 1 Encounters:  ?05/22/21 90  ?  ?  ?  ?  Passed - Valid encounter within last 12 months  ?  Recent Outpatient Visits   ? ?      ? 4 days ago COPD mixed type ([place])  ? [place], NP  ? 1 month ago COPD mixed type [facility])  ? [place], NP  ? 2 months ago Ritta Slot  ? [place], NP  ? 3 months ago Annual physical exam  ? [place], NP  ? 5 months ago COPD mixed type [facility])  ? [facility] Jon Billings, NP  ? ?  ?  ?Future Appointments   ? ?        ? In 2 weeks Gollan, Kathlene November, MD [facility], [facility]  ? In 2 months Jon Billings, NP [facility], [facility]  ? ?  ? ?  ?  ?  ? QUEtiapine (  SEROQUEL) 25 MG tablet [Pharmacy Med Name: QUEtiapine Fumarate 25 MG Oral Tablet] 120 tablet 0  ?  Sig: TAKE 1 TABLET BY MOUTH IN THE MORNING AND 2 TABLETS AT BEDTIME. CAN INCREASE TO 3 TABLETS AT BEDTIME AFTER 1 WEEK.  ?  ? Not Delegated - Psychiatry:  Antipsychotics - Second Generation (Atypical) - quetiapine Failed - 05/23/2021  9:40 AM  ?  ?  Failed - This refill cannot be delegated  ?  ?  Failed - Lipid Panel in normal range within the last 12 months  ?  Cholesterol, Total  ?Date Value Ref Range Status  ?02/14/2021 137 100 - 199 mg/dL Final  ? ?LDL Chol Calc (NIH)  ?Date Value Ref Range Status  ?02/14/2021 46 0 - 99 mg/dL Final  ? ?HDL  ?Date Value Ref Range Status  ?02/14/2021 22 (L) >39 mg/dL Final  ? ?Triglycerides  ?Date Value Ref Range Status  ?02/14/2021 470 (H) 0 - 149 mg/dL Final  ? ?  ?  ?  Passed - TSH in normal range and within 360 days  ?  TSH  ?Date Value Ref Range Status  ?02/14/2021 0.854 0.450 - 4.500 uIU/mL Final  ?  ?  ?  ?  Passed - Completed PHQ-2 or PHQ-9 in the last 360 days  ?  ?  Passed - Last BP in normal range  ?  BP Readings from Last 1 Encounters:  ?05/22/21 118/64  ?  ?  ?  ?  Passed - Last Heart Rate in  normal range  ?  Pulse Readings from Last 1 Encounters:  ?05/22/21 90  ?  ?  ?  ?  Passed - Valid encounter within last 6 months  ?  Recent Outpatient Visits   ? ?      ? 4 days ago COPD mixed type ([place])  ? [place], NP  ? 1 month ago COPD mixed type [facility])  ? [place], NP  ? 2 months ago Ritta Slot  ? [place], NP  ? 3 months ago Annual physical exam  ? [place], NP  ? 5 months ago COPD mixed type [facility])  ? [facility] Jon Billings, NP  ? ?  ?  ?Future Appointments   ? ?        ? In 2 weeks Gollan, Kathlene November, MD [facility], [facility]  ? In 2 months Jon Billings, NP [facility], [facility]  ? ?  ? ?  ?  ?  Passed - CBC within normal limits and completed in the last 12 months  ?  WBC  ?Date Value Ref Range Status  ?02/14/2021 7.2 3.4 - 10.8 x10E3/uL Final  ?02/10/2016 5.9 3.6 - 11.0 K/uL Final  ? ?RBC  ?Date Value Ref Range Status  ?02/14/2021 4.21 3.77 - 5.28 x10E6/uL Final  ?02/10/2016 4.00 3.80 - 5.20 MIL/uL Final  ? ?Hemoglobin  ?Date Value Ref Range Status  ?02/14/2021 14.6 11.1 - 15.9 g/dL Final  ? ?Hematocrit  ?Date Value Ref Range Status  ?02/14/2021 43.0 34.0 - 46.6 % Final  ? ?MCHC  ?Date Value Ref Range Status  ?02/14/2021 34.0 31.5 - 35.7 g/dL Final  ?02/10/2016 34.0 32.0 - 36.0 g/dL Final  ? ?MCH  ?Date Value Ref Range Status  ?02/14/2021 34.7 (H) 26.6 - 33.0 pg Final  ?02/10/2016 35.1 (H) 26.0 - 34.0 pg Final  ? ?MCV  ?Date Value Ref  Range Status  ?02/14/2021 102 (H) 79 - 97 fL Final  ? ?No results found for: PLTCOUNTKUC, LABPLAT, [place] ?RDW  ?Date Value Ref Range Status  ?02/14/2021 12.4 11.7 - 15.4 % Final  ? ?  ?  ?  Passed - CMP within normal limits and completed in the last 12 months  ?  Albumin  ?Date Value Ref Range Status  ?02/14/2021 3.5 (L) 3.8 - 4.9 g/dL Final  ? ?Alkaline Phosphatase  ?Date Value Ref Range Status   ?02/14/2021 84 44 - 121 IU/L Final  ? ?ALT  ?Date Value Ref Range Status  ?02/14/2021 12 0 - 32 IU/L Final  ? ?AST  ?Date Value Ref Range Status  ?02/14/2021 13 0 - 40 IU/L Final  ? ?BUN  ?Date Value Ref Range Status  ?02/14/2021 15 6 - 24 mg/dL Final  ? ?Calcium  ?Date Value Ref Range Status  ?02/14/2021 8.1 (L) 8.7 - 10.2 mg/dL Final  ? ?CO2  ?Date Value Ref Range Status  ?02/14/2021 18 (L) 20 - 29 mmol/L Final  ? ?Creatinine, Ser  ?Date Value Ref Range Status  ?02/14/2021 0.55 (L) 0.57 - 1.00 mg/dL Final  ? ?Glucose  ?Date Value Ref Range Status  ?02/14/2021 95 70 - 99 mg/dL Final  ? ?Glucose, Bld  ?Date Value Ref Range Status  ?02/10/2016 107 (H) 65 - 99 mg/dL Final  ? ?Potassium  ?Date Value Ref Range Status  ?02/14/2021 4.4 3.5 - 5.2 mmol/L Final  ? ?Sodium  ?Date Value Ref Range Status  ?02/14/2021 145 (H) 134 - 144 mmol/L Final  ? ?Bilirubin Total  ?Date Value Ref Range Status  ?02/14/2021 <0.2 0.0 - 1.2 mg/dL Final  ? ?Protein, ur  ?Date Value Ref Range Status  ?02/10/2016 NEGATIVE NEGATIVE mg/dL Final  ? ?Protein,UA  ?Date Value Ref Range Status  ?02/14/2021 Negative Negative/Trace Final  ? ?Total Protein  ?Date Value Ref Range Status  ?02/14/2021 5.4 (L) 6.0 - 8.5 g/dL Final  ? ?GFR calc Af Amer  ?Date Value Ref Range Status  ?02/18/2018 94 >59 mL/min/1.73 Final  ? ?eGFR  ?Date Value Ref Range Status  ?02/14/2021 106 >59 mL/min/1.73 Final  ? ?GFR calc non Af Amer  ?Date Value Ref Range Status  ?02/18/2018 81 >59 mL/min/1.73 Final  ? ?  ?  ?  ?  ?

## 2021-05-26 NOTE — Telephone Encounter (Signed)
Refilled today 05/26/21. ?

## 2021-05-27 ENCOUNTER — Ambulatory Visit: Admission: RE | Admit: 2021-05-27 | Payer: Medicaid Other | Source: Ambulatory Visit

## 2021-05-28 ENCOUNTER — Ambulatory Visit: Payer: Self-pay | Admitting: *Deleted

## 2021-05-28 NOTE — Telephone Encounter (Signed)
See other telephone encounter.

## 2021-05-28 NOTE — Telephone Encounter (Signed)
Called and LVM asking for patient to please return my call.  ? ?OK for [facility] to ask patient what she is needing this medication for per Clydie Braun.  ?

## 2021-05-28 NOTE — Telephone Encounter (Signed)
Pt has questions on medication QUEtiapine (SEROQUEL) 25 MG tablet that she would like to discuss. Pt stated directions on how to take medication was changed and she would like to speak with a nurse about this.  ?Per agent: ?"Pt states this med has always been 1 or 2 in the day with 2 or 3 at nite. Pt states that script saying only one at night as even from beginning of taking med has not been only one at nite. yet  current script states only one at night. She is still very confused and is wanting clarification. FU [phone] " ? ? ?Chief Complaint: Med dosage, Seroquel. ?Symptoms: NA ?Frequency: NA ?Pertinent Negatives: Patient denies  ?Disposition: [] ED /[] Urgent Care (no appt availability in office) / [] Appointment(In office/virtual)/ []  [place] [facility]/ [] Home Care/ [] Refused Recommended Disposition /[] [place] [facility]/ []  Follow-up with PCP ?Additional Notes: Pt states has been on 1 tab during day and 3 at HS since September. States started with 2 at HS then after 1 week went up to 3 at [facility] as prescribed. ?Please advise, pt expresses frustration. ?[phone]. States may leave detailed message on VM. ? ?Reason for Disposition ? [1] Caller has NON-URGENT medicine question about med that PCP prescribed AND [2] triager unable to answer question ? ?Answer Assessment - Initial Assessment Questions ?1. NAME of MEDICATION: "What medicine are you calling about?" ?    Seroquel ?2. QUESTION: "What is your question?" (e.g., double dose of medicine, side effect) ?    Dosage. ? ?Protocols used: Medication Question Call-A-AH ? ?

## 2021-05-28 NOTE — Telephone Encounter (Signed)
Called and LVM asking for patient to please return my call to discuss medication.  ?

## 2021-05-28 NOTE — Telephone Encounter (Signed)
Patient returned call. States that this uses this mouthwash to help rinse her mouth out after using her inhaler. States sometimes she gets blisters if she does not rinse her mouth out.  ?

## 2021-05-28 NOTE — Telephone Encounter (Signed)
Patient called back. Advised patient of Karens message. Patient states she has always taken the medication in the morning and at bedtime. Virtual appointment scheduled to discuss medication tomorrow with Clydie Braun.  ?

## 2021-05-29 ENCOUNTER — Encounter: Payer: Self-pay | Admitting: Nurse Practitioner

## 2021-05-29 ENCOUNTER — Ambulatory Visit (INDEPENDENT_AMBULATORY_CARE_PROVIDER_SITE_OTHER): Payer: Medicaid Other | Admitting: Nurse Practitioner

## 2021-05-29 DIAGNOSIS — F332 Major depressive disorder, recurrent severe without psychotic features: Secondary | ICD-10-CM

## 2021-05-29 DIAGNOSIS — K1379 Other lesions of oral mucosa: Secondary | ICD-10-CM

## 2021-05-29 MED ORDER — QUETIAPINE FUMARATE 25 MG PO TABS: 25.0000 mg | ORAL_TABLET | Freq: Every day | ORAL | 1 refills | Status: DC

## 2021-05-29 MED ORDER — DEXAMETHASONE 0.5 MG/5ML PO ELIX: 1.0000 mg | ORAL_SOLUTION | Freq: Every day | ORAL | 0 refills | Status: DC

## 2021-05-29 MED ORDER — QUETIAPINE FUMARATE 50 MG PO TABS: 50.0000 mg | ORAL_TABLET | Freq: Two times a day (BID) | ORAL | 1 refills | Status: DC

## 2021-05-29 NOTE — Assessment & Plan Note (Signed)
Chronic. Patient does not want to change medication despite weight gain.  Will continue with Seroquel 50mg  in the morning and 75mg  at bedtime.  Refills sent to the pharmacy.  Follow up in 3 months for reevaluation. ?

## 2021-05-29 NOTE — Telephone Encounter (Signed)
Will address during visit today. ? ?

## 2021-05-29 NOTE — Progress Notes (Signed)
? ?There were no vitals taken for this visit.  ? ?Subjective:  ? ? Patient ID: Karen Foster, female    DOB: 04/02/1960, 61 y.o.   MRN: [id number] ? ?HPI: ?Karen Foster is a 61 y.o. female ? ?Chief Complaint  ?Patient presents with  ? Medication Refill  ? ?Patient states she has been taking Seroquel 78m in the am and 75 at night.  [facility]   ? ?Patient states she gets blisters in her mouth after using her inhaler.  She states doesn't always remember to rinse her mouth out after her inhaler.  She currently has blisters in her mouth from her inhaler.  ? ? ?Relevant past medical, surgical, family and social history reviewed and updated as indicated. Interim medical history since our last visit reviewed. ?Allergies and medications reviewed and updated. ? ?Review of Systems  ?HENT:  Positive for mouth sores.   ?Psychiatric/Behavioral:  Positive for dysphoric mood and sleep disturbance. The patient is nervous/anxious.   ? ?Per HPI unless specifically indicated above ? ?   ?Objective:  ?  ?There were no vitals taken for this visit.  ?Wt Readings from Last 3 Encounters:  ?05/22/21 166 lb 3.2 oz (75.4 kg)  ?04/11/21 160 lb 9.6 oz (72.8 kg)  ?02/28/21 159 lb (72.1 kg)  ?  ?Physical Exam ?Vitals and nursing note reviewed.  ?Pulmonary:  ?   Effort: Pulmonary effort is normal. No respiratory distress.  ?Neurological:  ?   Mental Status: She is alert.  ?Psychiatric:     ?   Mood and Affect: Mood normal.     ?   Behavior: Behavior normal.     ?   Thought Content: Thought content normal.     ?   Judgment: Judgment normal.  ? ? ?Results for orders placed or performed in visit on 02/14/21  ?CBC with Differential/Platelet  ?Result Value Ref Range  ? WBC 7.2 3.4 - 10.8 x10E3/uL  ? RBC 4.21 3.77 - 5.28 x10E6/uL  ? Hemoglobin 14.6 11.1 - 15.9 g/dL  ? Hematocrit 43.0 34.0 - 46.6 %  ? MCV 102 (H) 79 - 97 fL  ? MCH 34.7 (H) 26.6 - 33.0 pg  ?  MCHC 34.0 31.5 - 35.7 g/dL  ? RDW 12.4 11.7 - 15.4 %  ? Platelets 207 150 - 450 x10E3/uL  ? Neutrophils 57 Not Estab. %  ? Lymphs 33 Not Estab. %  ? Monocytes 9 Not Estab. %  ? Eos 1 Not Estab. %  ? Basos 0 Not Estab. %  ? Neutrophils Absolute 4.0 1.4 - 7.0 x10E3/uL  ? Lymphocytes Absolute 2.4 0.7 - 3.1 x10E3/uL  ? Monocytes Absolute 0.7 0.1 - 0.9 x10E3/uL  ? EOS (ABSOLUTE) 0.1 0.0 - 0.4 x10E3/uL  ? Basophils Absolute 0.0 0.0 - 0.2 x10E3/uL  ? Immature Granulocytes 0 Not Estab. %  ? Immature Grans (Abs) 0.0 0.0 - 0.1 x10E3/uL  ?Comprehensive metabolic panel  ?Result Value Ref Range  ? Glucose 95 70 - 99 mg/dL  ? BUN 15 6 - 24 mg/dL  ? Creatinine, Ser 0.55 (L) 0.57 - 1.00 mg/dL  ? eGFR 106 >59 mL/min/1.73  ? BUN/Creatinine Ratio 27 (H) 9 - 23  ? Sodium 145 (H) 134 - 144 mmol/L  ? Potassium 4.4 3.5 - 5.2 mmol/L  ? Chloride 113 (H) 96 - 106 mmol/L  ? CO2 18 (L) 20 - 29 mmol/L  ?  Calcium 8.1 (L) 8.7 - 10.2 mg/dL  ? Total Protein 5.4 (L) 6.0 - 8.5 g/dL  ? Albumin 3.5 (L) 3.8 - 4.9 g/dL  ? Globulin, Total 1.9 1.5 - 4.5 g/dL  ? Albumin/Globulin Ratio 1.8 1.2 - 2.2  ? Bilirubin Total <0.2 0.0 - 1.2 mg/dL  ? Alkaline Phosphatase 84 44 - 121 IU/L  ? AST 13 0 - 40 IU/L  ? ALT 12 0 - 32 IU/L  ?Lipid panel  ?Result Value Ref Range  ? Cholesterol, Total 137 100 - 199 mg/dL  ? Triglycerides 470 (H) 0 - 149 mg/dL  ? HDL 22 (L) >39 mg/dL  ? VLDL Cholesterol Cal 69 (H) 5 - 40 mg/dL  ? LDL Chol Calc (NIH) 46 0 - 99 mg/dL  ? Chol/HDL Ratio 6.2 (H) 0.0 - 4.4 ratio  ?TSH  ?Result Value Ref Range  ? TSH 0.854 0.450 - 4.500 uIU/mL  ?Urinalysis, Routine w reflex microscopic  ?Result Value Ref Range  ? Specific Gravity, UA      >=1.030 (A) 1.005 - 1.030  ? pH, UA 5.5 5.0 - 7.5  ? Color, UA Yellow Yellow  ? Appearance Ur Clear Clear  ? Leukocytes,UA Negative Negative  ? Protein,UA Negative Negative/Trace  ? Glucose, UA Negative Negative  ? Ketones, UA Negative Negative  ? RBC, UA Negative Negative  ? Bilirubin, UA Negative Negative  ?  Urobilinogen, Ur 1.0 0.2 - 1.0 mg/dL  ? Nitrite, UA Negative Negative  ? Microscopic Examination Comment   ?Cytology - PAP  ?Result Value Ref Range  ? Adequacy    ?  Satisfactory for evaluation; transformation zone component ABSENT.  ? Diagnosis    ?  - Negative for intraepithelial lesion or malignancy (NILM)  ? ?   ?Assessment & Plan:  ? ?Problem List Items Addressed This Visit   ? ?  ? Other  ? Depression - Primary  ?  Chronic. Patient does not want to change medication despite weight gain.  Will continue with Seroquel 39m in the morning and 771mat bedtime.  Refills sent to the pharmacy.  Follow up in 3 months for reevaluation. ?  ?  ? ?Other Visit Diagnoses   ? ? Mouth sores      ? Will give dexamethasone solution to help with mouth sores.  Discussed how to properly use medication.   ? ?  ?  ? ?Follow up plan: ?Return if symptoms worsen or fail to improve. ? ? ?This visit was completed via MyChart due to the restrictions of the COVID-19 pandemic. All issues as above were discussed and addressed. Physical exam was done as above through visual confirmation on MyChart. If it was felt that the patient should be evaluated in the office, they were directed there. The patient verbally consented to this visit. ?Location of the patient: Home ?Location of the provider: Office ?Those involved with this call:  ?Provider: KaJon BillingsNP ?CMA: NA ?Front Desk/Registration: IrLynnell CatalanThis encounter was conducted via Telephone.  I spent 21 dedicated to the care of this patient on the date of this encounter to include previsit review of medications, plan of care moving forward and when to follow up, face to face time with the patient, and post visit ordering of testing.  ? ? ? ? ?

## 2021-05-31 DIAGNOSIS — Z419 Encounter for procedure for purposes other than remedying health state, unspecified: Secondary | ICD-10-CM | POA: Diagnosis not present

## 2021-06-05 NOTE — Congregational Nurse Program (Signed)
?  Dept: [phone] ? ? ?Congregational Nurse Program Note ? ?Date of Encounter: 06/05/2021 ?Client to clinic for vital sign check and support.discussed upcoming cardiology appointment on 4/10. She is planning on going, does not need transportation assistance. ?BP 117/72 (BP Location: Left Arm)  HR 102,  SpO2 95% room air. ?No new needs or concerns. ? ?Past Medical History: ?Past Medical History:  ?Diagnosis Date  ? Allergy   ? Anxiety   ? Arthritis   ? Depression   ? GERD (gastroesophageal reflux disease)   ? Glaucoma   ? Heart murmur   ? Kidney stones   ? Migraines   ? Panic attack   ? Panic attack   ? ? ?Encounter Details: ? CNP Questionnaire - 06/05/21 1030   ? ?  ? Questionnaire  ? Do you give verbal consent to treat you today? Yes   ? Location Patient Served  [facility]   ? Visit Setting Church or Organization   ? Patient Status Homeless   ? Insurance Medicaid   well care  ? Insurance Referral N/A   ? Medication N/A   no copay for meds with her current insurance  ? Medical Provider Yes   [facility]  ? Screening Referrals N/A   discussed need for mamogram  ? Medical Referral N/A   ? Medical Appointment Made N/A   [facility] cardiology appointment 3/10 at 2:40 pm  ? Food N/A   ? Transportation N/A   has friends that provide trnasportation, or the [place] bus  ? Housing/Utilities No permanent housing   currenlty living in a car on a  friends property  ? Interpersonal Safety N/A   ? Intervention Educate;Support;Blood pressure   ? ED Visit Averted N/A   ? Life-Saving Intervention Made N/A   ? ?  ?  ? ?  ? ? ? ? ?

## 2021-06-09 ENCOUNTER — Encounter: Payer: Self-pay | Admitting: Cardiovascular Disease

## 2021-06-09 ENCOUNTER — Ambulatory Visit (INDEPENDENT_AMBULATORY_CARE_PROVIDER_SITE_OTHER): Payer: Medicaid Other | Admitting: Cardiovascular Disease

## 2021-06-09 VITALS — BP 128/70 | HR 100 | Ht 65.5 in | Wt 168.2 lb

## 2021-06-09 DIAGNOSIS — I341 Nonrheumatic mitral (valve) prolapse: Secondary | ICD-10-CM

## 2021-06-09 MED ORDER — METOPROLOL TARTRATE 50 MG PO TABS: 50.0000 mg | ORAL_TABLET | Freq: Two times a day (BID) | ORAL | 3 refills | Status: DC

## 2021-06-09 NOTE — Patient Instructions (Addendum)
Medication Instructions:  ?Please start metoprolol tartrate 50 mg twice a day ? ?If you need a refill on your cardiac medications before your next appointment, please call your pharmacy.  ? ?Lab work: ?No new labs needed ? ?Testing/Procedures: ? ?Your physician has requested that you have an echocardiogram. Echocardiography is a painless test that uses sound waves to create images of your heart. It provides your doctor with information about the size and shape of your heart and how well your heart?s chambers and valves are working. This procedure takes approximately one hour. There are no restrictions for this procedure.  ? ?Follow-Up: ?At [facility], you and your health needs are our priority.  As part of our continuing mission to provide you with exceptional heart care, we have created designated Provider Care Teams.  These Care Teams include your primary Cardiologist (physician) and Advanced Practice Providers (APPs -  Physician Assistants and Nurse Practitioners) who all work together to provide you with the care you need, when you need it. ? ?You will need a follow up appointment in 12 months ? ?Providers on your designated Care Team:   ?Nicolasa Ducking, NP ?Eula Listen, PA-C ?Cadence Fransico Michael, PA-C ? ?COVID-19 Vaccine Information can be found at: [email or web address] For questions related to vaccine distribution or appointments, please email [email or web address] or call [phone].  ? ?

## 2021-06-09 NOTE — Progress Notes (Signed)
Cardiology Office Note ? ?Date:  06/09/2021  ? ?ID:  Karen Foster, DOB [birth year], MRN [id number] ? ?PCP:  Jon Billings, NP  ? ?Chief Complaint  ?Patient presents with  ? New Patient (Initial Visit)  ?  Ref by Jon Billings, NP for tachycardia & evaluation of MVP. Patient c/o shortness of breath, tachycardia at times and LE edema. Medications reviewed by the patient verbally.   ? ? ?HPI:  ?[name] is a 61 year old woman with past medical history of ?COPD, smoker, 43 years of smoking ?Depression/panic attacks ?Reports a long history of sinus tachycardia previously treated with metoprolol tartrate 100 twice daily ?Who presents by referral from Jon Billings for tachycardia ? ?In follow-up today concerned about diagnosis of mitral valve prolapse more than 20 years ago, has not had recent work-up ? ?Reports smoking less than 1/2 pack/day ?Uses inhalers ? ?Told she has MVP 20-30 years ago ?Previously Took metoprolol 2-3 times a day (was taking 100 mg BID) ?She is indicated she would like to restart metoprolol ? ?Active, no regular exercise program ? ?EKG personally reviewed by myself on todays visit ?Sinus tachycardia rate 100 bpm ? ? ?PMH:   has a past medical history of Allergy, Anxiety, Arthritis, Depression, GERD (gastroesophageal reflux disease), Glaucoma, Heart murmur, Kidney stones, Migraines, Panic attack, and Panic attack. ? ?PSH:    ?Past Surgical History:  ?Procedure Laterality Date  ? FRACTURE SURGERY    ? JOINT REPLACEMENT  2011  ? Hip  ? REVISION TOTAL HIP ARTHROPLASTY  2011  ? SEPTOPLASTY  2007  ? TONSILLECTOMY    ? TOTAL ABDOMINAL HYSTERECTOMY W/ BILATERAL SALPINGOOPHORECTOMY    ? ? ?Current Outpatient Medications  ?Medication Sig Dispense Refill  ? albuterol (VENTOLIN HFA) 108 (90 Base) MCG/ACT inhaler Inhale 2 puffs into the lungs every 6 (six) hours as needed for wheezing or shortness of breath. 1 each 3  ? Aspirin-Acetaminophen (GOODYS BODY PAIN PO) Take by mouth.    ? benzonatate  (TESSALON) 100 MG capsule TAKE 2 CAPSULES BY MOUTH THREE TIMES DAILY AS NEEDED FOR COUGH 90 capsule 0  ? budesonide-formoterol (SYMBICORT) 160-4.5 MCG/ACT inhaler Inhale 2 puffs into the lungs 2 (two) times daily. 1 each 3  ? cyclobenzaprine (FLEXERIL) 10 MG tablet Take 1 tablet (10 mg total) by mouth 3 (three) times daily as needed for muscle spasms. 90 tablet 0  ? dexamethasone 0.5 MG/5ML elixir Take 10 mLs (1 mg total) by mouth daily. 100 mL 0  ? diclofenac sodium (VOLTAREN) 1 % GEL Apply 2 g topically 4 (four) times daily as needed. 100 g 3  ? DULoxetine (CYMBALTA) 20 MG capsule Take 1 capsule (20 mg total) by mouth daily. 90 capsule 1  ? gabapentin (NEURONTIN) 300 MG capsule Take 1 capsule by mouth 4 times daily 120 capsule 1  ? montelukast (SINGULAIR) 10 MG tablet Take 1 tablet (10 mg total) by mouth at bedtime. 90 tablet 1  ? pantoprazole (PROTONIX) 20 MG tablet Take 1 tablet (20 mg total) by mouth 2 (two) times daily. 180 tablet 1  ? QUEtiapine (SEROQUEL) 25 MG tablet Take 1 tablet (25 mg total) by mouth at bedtime. To be taken with 37m at bedtime 90 tablet 1  ? QUEtiapine (SEROQUEL) 50 MG tablet Take 1 tablet (50 mg total) by mouth 2 (two) times daily. To be taken in the morning 180 tablet 1  ? rosuvastatin (CRESTOR) 10 MG tablet Take 1 tablet (10 mg total) by mouth daily. 90 tablet 1  ?  SPIRIVA RESPIMAT 2.5 MCG/ACT AERS INHALE 2 SPRAY(S) BY MOUTH ONCE DAILY 4 g 0  ? ?No current facility-administered medications for this visit.  ? ? ? ?Allergies:   Bee venom, Ibuprofen, Sulfa antibiotics, Tomato, and Hydroxyzine  ? ?Social History:  The patient  reports that she has been smoking cigarettes. She has a 20.00 pack-year smoking history. She has never used smokeless tobacco. She reports that she does not drink alcohol and does not use drugs.  ? ?Family History:   family history includes Asthma in her maternal uncle and mother; Cancer in her father and maternal uncle; Diabetes in her maternal grandfather,  mother, and paternal uncle; Heart attack in her maternal grandfather, mother, paternal aunt, and paternal uncle; Heart disease in her maternal aunt; Hyperlipidemia in her maternal grandfather; Hypertension in her maternal aunt; Kidney disease in her mother; Kidney failure in her maternal grandmother and mother; Multiple myeloma in her maternal grandmother; Other in her maternal grandmother.  ? ? ?Review of Systems: ?Review of Systems  ?Constitutional: Negative.   ?HENT: Negative.    ?Respiratory: Negative.    ?Cardiovascular: Negative.   ?Gastrointestinal: Negative.   ?Musculoskeletal: Negative.   ?Neurological: Negative.   ?Psychiatric/Behavioral: Negative.    ?All other systems reviewed and are negative. ? ? ?PHYSICAL EXAM: ?VS:  BP 128/70 (BP Location: Left Arm, Patient Position: Sitting, Cuff Size: Normal)   Pulse 100   Ht 5' 5.5" (1.664 m)   Wt 76.3 kg   SpO2 98%   BMI 27.57 kg/m?  , BMI Body mass index is 27.57 kg/m?. ?GEN: Well nourished, well developed, in no acute distress ?HEENT: normal ?Neck: no JVD, carotid bruits, or masses ?Cardiac: tachycardia; no murmurs, rubs, or gallops,no edema  ?Respiratory:  clear to auscultation bilaterally, normal work of breathing ?GI: soft, nontender, nondistended, + BS ?MS: no deformity or atrophy ?Skin: warm and dry, no rash ?Neuro:  Strength and sensation are intact ?Psych: euthymic mood, full affect ? ?Recent Labs: ?02/14/2021: ALT 12; BUN 15; Creatinine, Ser 0.55; Hemoglobin 14.6; Platelets 207; Potassium 4.4; Sodium 145; TSH 0.854  ? ? ?Lipid Panel ?Lab Results  ?Component Value Date  ? CHOL 137 02/14/2021  ? HDL 22 (L) 02/14/2021  ? LDLCALC 46 02/14/2021  ? TRIG 470 (H) 02/14/2021  ? ?  ? ?Wt Readings from Last 3 Encounters:  ?06/09/21 76.3 kg  ?05/22/21 75.4 kg  ?04/11/21 72.8 kg  ?  ? ? ? ?ASSESSMENT AND PLAN: ? ?Problem List Items Addressed This Visit   ?None ?Visit Diagnoses   ? ? MVP (mitral valve prolapse)    -  Primary  ? Relevant Medications  ? metoprolol  tartrate (LOPRESSOR) 50 MG tablet  ? Other Relevant Orders  ? EKG 12-Lead  ? ECHOCARDIOGRAM COMPLETE  ? ?  ? ?Sinus tachycardia ?Reports she was previously treated with metoprolol tartrate 100 twice daily ?At her request we will restart metoprolol to tartrate 50 twice daily ?Recommend close monitoring of blood pressure, may need to decrease dosing down to 25 twice daily for orthostasis ? ?Smoker ?We have encouraged her to continue to work on weaning her cigarettes and smoking cessation. She will continue to work on this and does not want any assistance with chantix.   ? ?Mitral valve disease ?She reports history of mitral valve prolapse ?Echocardiogram has been ordered, last was done many years ago ?No significant murmur appreciated on exam but heart rate markedly elevated ?Beta-blocker started as above ? ? Total encounter time more than 45 minutes ?  Greater than 50% was spent in counseling and coordination of care with the patient ? ? ? ?Signed, ?Esmond Plants, M.D., Ph.D. ?[facility] [place], [place] ?[phone] ?

## 2021-06-10 ENCOUNTER — Other Ambulatory Visit: Payer: Self-pay | Admitting: Nurse Practitioner

## 2021-06-10 DIAGNOSIS — J4540 Moderate persistent asthma, uncomplicated: Secondary | ICD-10-CM

## 2021-06-10 NOTE — Congregational Nurse Program (Signed)
?  Dept: [phone] ? ? ?Congregational Nurse Program Note ? ?Date of Encounter: 06/10/2021 ?Client to clinic for vital sign check and to discuss Cardiology appointment she had yesterday 4/10. ?BP 118/70 (BP Location: Left Arm, Patient Position: Sitting, Cuff Size: Normal)   Pulse 89   SpO2 97% Comment: room air ?Client was started on metoprolol 50 mg twice a day. Discussed importance of taking this medication regularly. She is agreeable to coming to center daily for BP and pulse check. ?She reports having an echocardiogram scheduled for 4/26. ?We also discussed the inability to see her records from [facility]. She was seen late in the summer and last fall in relation to cardiac issues. RN to assist with contacting medical records at [facility] to set up her Mychart.  ?Past Medical History: ?Past Medical History:  ?Diagnosis Date  ? Allergy   ? Anxiety   ? Arthritis   ? Depression   ? GERD (gastroesophageal reflux disease)   ? Glaucoma   ? Heart murmur   ? Kidney stones   ? Migraines   ? Panic attack   ? Panic attack   ? ? ?Encounter Details: ? CNP Questionnaire - 06/10/21 0845   ? ?  ? Questionnaire  ? Do you give verbal consent to treat you today? Yes   ? Location Patient [place]   ? Visit Setting Church or Organization   ? Patient Status Homeless   ? Insurance Medicaid   well care  ? Insurance Referral N/A   ? Medication N/A   no copay for meds with her current insurance  ? Medical Provider Yes   [place] [facility]  ? Screening Referrals N/A   discussed need for mamogram  ? Medical Referral N/A   ? Medical Appointment Made N/A   [facility] cardiology appointment 4/10 at 2:40 pm. Cleint did attend this appointment  ? Food N/A   has food stamps  ? Transportation N/A   has friends that provide trnasportation, or the [place] bus  ? Housing/Utilities No permanent housing   currenlty living in a car on a  friends property  ? Interpersonal Safety N/A   ? Intervention Educate;Support;Blood pressure;Case  Management;Navigate Healthcare System   ? ED Visit Averted N/A   ? Life-Saving Intervention Made N/A   ? ?  ?  ? ?  ? ? ? ? ?

## 2021-06-11 NOTE — Telephone Encounter (Signed)
Requested Prescriptions  ?Pending Prescriptions Disp Refills  ?? benzonatate (TESSALON) 100 MG capsule [Pharmacy Med Name: Benzonatate 100 MG Oral Capsule] 90 capsule 0  ?  Sig: TAKE 2 CAPSULES BY MOUTH THREE TIMES DAILY AS NEEDED FOR COUGH  ?  ? Ear, Nose, and Throat:  Antitussives/Expectorants Passed - 06/10/2021  3:05 PM  ?  ?  Passed - Valid encounter within last 12 months  ?  Recent Outpatient Visits   ?      ? 1 week ago Severe episode of recurrent major depressive disorder, without psychotic features (HCC)  ? [facility] Larae Grooms, NP  ? 2 weeks ago COPD mixed type [facility])  ? [facility] Larae Grooms, NP  ? 2 months ago COPD mixed type [facility])  ? [facility] Larae Grooms, NP  ? 3 months ago Ginette Pitman  ? [facility] Larae Grooms, NP  ? 3 months ago Annual physical exam  ? [facility] Larae Grooms, NP  ?  ?  ?Future Appointments   ?        ? In 2 months Larae Grooms, NP [facility]  ?  ? ?  ?  ?  ? ? ?

## 2021-06-11 NOTE — Congregational Nurse Program (Signed)
?  Dept: [phone] ? ? ?Congregational Nurse Program Note ? ?Date of Encounter: 06/11/2021 ?Client to center for blood pressure check and support. First BP was 94/62, pulse 79, ?repeat BP aprox 2 hours later was 106/70, pulse 76. She remains agreeable to daily monitoring of BP. Currently she is taking metoprolol 50 mg bid for heart rate control as prescribed by her cardiologist.  ?Past Medical History: ?Past Medical History:  ?Diagnosis Date  ? Allergy   ? Anxiety   ? Arthritis   ? Depression   ? GERD (gastroesophageal reflux disease)   ? Glaucoma   ? Heart murmur   ? Kidney stones   ? Migraines   ? Panic attack   ? Panic attack   ? ? ?Encounter Details: ? CNP Questionnaire - 06/11/21 1100   ? ?  ? Questionnaire  ? Do you give verbal consent to treat you today? Yes   ? Location Patient Served  [facility]   ? Visit Setting Church or Organization   ? Patient Status Homeless   ? Insurance Medicaid   well care  ? Insurance Referral N/A   ? Medication N/A   no copay for meds with her current insurance  ? Medical Provider Yes   [facility]  ? Screening Referrals N/A   discussed need for mamogram  ? Medical Referral N/A   ? Medical Appointment Made N/A   [facility] cardiology appointment 4/10 at 2:40 pm. Cleint did attend this appointment  ? Food N/A   has food stamps  ? Transportation N/A   has friends that provide trnasportation, or the [place] bus  ? Housing/Utilities No permanent housing   currenlty living in a car on a  friends property  ? Interpersonal Safety N/A   ? Intervention Educate;Support;Blood pressure   ? ED Visit Averted N/A   ? Life-Saving Intervention Made N/A   ? ?  ?  ? ?  ? ? ? ? ?

## 2021-06-12 ENCOUNTER — Telehealth: Payer: Self-pay | Admitting: Cardiovascular Disease

## 2021-06-12 MED ORDER — METOPROLOL TARTRATE 50 MG PO TABS
25.0000 mg | ORAL_TABLET | Freq: Two times a day (BID) | ORAL | Status: DC
Start: 2021-06-12 — End: 2021-06-30

## 2021-06-12 NOTE — Telephone Encounter (Signed)
Pt c/o BP issue: STAT if pt c/o blurred vision, one-sided weakness or slurred speech ? ?1. What are your last 5 BP readings?  ?118/70 - 89 ( 4/11) ?106/70 - 76, 94/62 - 79 ( 4/12) ?92/68 - 75 ( Today) ? ? ? ?2. Are you having any other symptoms (ex. Dizziness, headache, blurred vision, passed out)? No  ? ?3. What is your BP issue? Pt stated that BP has been low since starting  ? metoprolol tartrate (LOPRESSOR) 50 MG tablet  ? ?Pt would like a callback or message sent to [facility]. Please advise ?

## 2021-06-12 NOTE — Telephone Encounter (Signed)
Spoke with the patient. ? ?Per Dr. Donivan Scull 06/09/21 o/v notes: ? ?"Sinus tachycardia ?Reports she was previously treated with metoprolol tartrate 100 twice daily ?At her request we will restart metoprolol to tartrate 50 twice daily ?Recommend close monitoring of blood pressure, may need to decrease dosing ? down to 25 twice daily for orthostasis" ? ?Patient will reduce her Metoprolol down to 25 mg twice a day. Adv her to continue to monitor her BP. She should call the office if her BP remains consistently low or if episodes tachycardiac increases. ? ?Adv the patient that I will fwd an update to Dr. Rockey Situ. ? ?Patient verbalized understanding. ?

## 2021-06-12 NOTE — Congregational Nurse Program (Signed)
?  Dept: [phone] ? ? ?Congregational Nurse Program Note ? ?Date of Encounter: 06/12/2021 ?Client to center for continued blood pressure monitoring. She is currently taking metoprolol tartrate 50 mg bid for heart rate control. Her blood pressures have been much lower. Today 92/68, rechecked later in the morning 106/64. RN encouraged client to contact her cardiologist to discussed dosage of medication. Client did contact her MD and was instructed to now take 25 mg bid. Will continue to monitor BP during clinic hours. ?Past Medical History: ?Past Medical History:  ?Diagnosis Date  ? Allergy   ? Anxiety   ? Arthritis   ? Depression   ? GERD (gastroesophageal reflux disease)   ? Glaucoma   ? Heart murmur   ? Kidney stones   ? Migraines   ? Panic attack   ? Panic attack   ? ? ?Encounter Details: ? CNP Questionnaire - 06/12/21 1030   ? ?  ? Questionnaire  ? Do you give verbal consent to treat you today? Yes   ? Location Patient [place]   ? Visit Setting Church or Organization   ? Patient Status Homeless   ? Insurance Medicaid   well care  ? Insurance Referral N/A   ? Medication N/A   no copay for meds with her current insurance  ? Medical Provider Yes   [facility]  ? Screening Referrals N/A   discussed need for mamogram  ? Medical Referral N/A   ? Medical Appointment Made N/A   ? Food N/A   has food stamps  ? Transportation N/A   has friends that provide trnasportation, or the [place] bus  ? Housing/Utilities No permanent housing   currenlty living in a car on a  friends property  ? Interpersonal Safety N/A   ? Intervention Educate;Support;Blood pressure;Advocate   ? ED Visit Averted N/A   ? Life-Saving Intervention Made N/A   ? ?  ?  ? ?  ? ? ? ? ?

## 2021-06-13 NOTE — Telephone Encounter (Signed)
Antonieta Iba, MD   ? ?Suspect she may do better on metoprolol tartrate 25 twice daily  ?Blood pressure running low on the 50 twice daily  ?She is welcome to continue on the 50 twice daily if no orthostasis symptoms but needs to be very careful given the low blood pressure  ?Thx  ?TG   ? ?Pt sent MyChart message with above information.  ? ?

## 2021-06-16 ENCOUNTER — Other Ambulatory Visit: Payer: Self-pay | Admitting: *Deleted

## 2021-06-16 NOTE — Patient Outreach (Signed)
Care Coordination ? ?06/16/2021 ? ?[name] ?09/08/1960 ?[id number] ? ? ?Medicaid Managed Care  ? ?Unsuccessful Outreach Note ? ?06/16/2021 ?Name: Karen Foster MRN: [id number] DOB: 05-Dec-1960 ? ?Referred by: Jon Billings, NP ?Reason for referral : High Risk Managed Medicaid (Unsuccessful RNCM follow up telephone outreach) ? ? ?An unsuccessful telephone outreach was attempted today. The patient was referred to the case management team for assistance with care management and care coordination.  ? ?Follow Up Plan: A HIPAA compliant phone message was left for the patient providing contact information and requesting a return call.  ? ?Lurena Joiner RN, BSN ?[place] ?RN Care Coordinator ? ? ?

## 2021-06-16 NOTE — Patient Instructions (Signed)
Visit Information  [name]  - as a part of your Medicaid benefit, you are eligible for care management and care coordination services at no cost or copay. I was unable to reach you by phone today but would be happy to help you with your health related needs. Please feel free to call me @ [phone].   A member of the Managed Medicaid care management team will reach out to you again over the next 14 days.   Dmarion Perfect RN, BSN [facility]  [facility] RN Care Coordinator   

## 2021-06-19 ENCOUNTER — Other Ambulatory Visit: Payer: Self-pay | Admitting: Nurse Practitioner

## 2021-06-19 NOTE — Telephone Encounter (Signed)
Requested medication (s) are due for refill today: yes ? ?Requested medication (s) are on the active medication list: yes ? ?Last refill:  05/22/21 #90 0 refills ? ?Future visit scheduled: yes in 2 months ? ?Notes to clinic:  not delegated per protocol ? ? ?  ?Requested Prescriptions  ?Pending Prescriptions Disp Refills  ? cyclobenzaprine (FLEXERIL) 10 MG tablet [Pharmacy Med Name: Cyclobenzaprine HCl 10 MG Oral Tablet] 90 tablet 0  ?  Sig: Take 1 tablet by mouth three times daily as needed for muscle spasm  ?  ? Not Delegated - Analgesics:  Muscle Relaxants Failed - 06/19/2021 11:13 AM  ?  ?  Failed - This refill cannot be delegated  ?  ?  Passed - Valid encounter within last 6 months  ?  Recent Outpatient Visits   ? ?      ? 3 weeks ago Severe episode of recurrent major depressive disorder, without psychotic features ([place])  ? [place], NP  ? 4 weeks ago COPD mixed type [facility])  ? [place], NP  ? 2 months ago COPD mixed type [facility])  ? [place], NP  ? 3 months ago Ritta Slot  ? [place], NP  ? 4 months ago Annual physical exam  ? [facility] Jon Billings, NP  ? ?  ?  ?Future Appointments   ? ?        ? In 2 months Jon Billings, NP [facility]  ? ?  ? ? ?  ?  ?  ? ?

## 2021-06-20 ENCOUNTER — Other Ambulatory Visit: Payer: Self-pay | Admitting: *Deleted

## 2021-06-20 DIAGNOSIS — Z5941 Food insecurity: Secondary | ICD-10-CM

## 2021-06-20 NOTE — Patient Outreach (Signed)
?Medicaid Managed Care   ?Nurse Care Manager Note ? ?06/20/2021 ?Name:  Karen Foster Foster MRN:  [id number] DOB:  [birth year] ? ?Karen Foster Foster is an 61 y.o. year old female who is a primary patient of Karen Foster Foster, Karen, NP.  [facility] was consulted for assistance with:    ?HLD ?COPD ? ?[name] was given information about Medicaid Managed Care Coordination team services today. Karen Foster Foster Patient agreed to services and verbal consent obtained. ? ?Engaged with patient by telephone for follow up visit in response to provider referral for case management and/or care coordination services.  ? ?Assessments/Interventions:  Review of past medical history, allergies, medications, health status, including review of consultants reports, laboratory and other test data, was performed as part of comprehensive evaluation and provision of chronic care management services. ? ?SDOH (Social Determinants of Health) assessments and interventions performed: ?SDOH Interventions   ? ?Flowsheet Row Most Recent Value  ?SDOH Interventions   ?Food Insecurity Interventions Other (Comment)  [Care Guide referral for local food pantries]  ?Housing Interventions Intervention Not Indicated  ? ?  ? ? ?Care Plan ? ?Allergies  ?Allergen Reactions  ? Bee Venom Anaphylaxis  ? Ibuprofen Anaphylaxis  ? Sulfa Antibiotics Anaphylaxis  ? Tomato Anaphylaxis and Nausea And Vomiting  ? Hydroxyzine Nausea Only  ? ? ?Medications Reviewed Today   ? ? Reviewed by Heidi Dachobb, Amyr Sluder A, RN (Registered Nurse) on 06/20/21 at 1051  Med List Status: <None>  ? ?Medication Order Taking? Sig Documenting Provider Last Dose Status Informant  ?albuterol (VENTOLIN HFA) 108 (90 Base) MCG/ACT inhaler [id number] Yes Inhale 2 puffs into the lungs every 6 (six) hours as needed for wheezing or shortness of breath. Karen Foster Foster, Karen, NP Taking Active   ?Aspirin-Acetaminophen (GOODYS BODY PAIN PO) [id number] Yes Take by mouth. [provider] Taking Active    ?benzonatate (TESSALON) 100 MG capsule [id number] Yes TAKE 2 CAPSULES BY MOUTH THREE TIMES DAILY AS NEEDED FOR COUGH Karen Foster Foster, Karen, NP Taking Active   ?budesonide-formoterol (SYMBICORT) 160-4.5 MCG/ACT inhaler [id number] Yes Inhale 2 puffs into the lungs 2 (two) times daily. Karen Foster Foster, Karen, NP Taking Active   ?cyclobenzaprine (FLEXERIL) 10 MG tablet [id number] Yes Take 1 tablet (10 mg total) by mouth 3 (three) times daily as needed for muscle spasms. Karen Foster Foster, Karen, NP Taking Active   ?dexamethasone 0.5 MG/5ML elixir [id number] Yes Take 10 mLs (1 mg total) by mouth daily. Karen Foster Foster, Karen, NP Taking Active   ?diclofenac sodium (VOLTAREN) 1 % GEL [id number] Yes Apply 2 g topically 4 (four) times daily as needed. Particia NearingLane, Rachel Elizabeth, [place] Taking Active   ?DULoxetine (CYMBALTA) 20 MG capsule [id number] Yes Take 1 capsule (20 mg total) by mouth daily. Karen Foster Foster, Karen, NP Taking Active   ?gabapentin (NEURONTIN) 300 MG capsule [id number] Yes Take 1 capsule by mouth 4 times daily Karen Foster Foster, Karen, NP Taking Active   ?metoprolol tartrate (LOPRESSOR) 50 MG tablet [id number] Yes Take 0.5 tablets (25 mg total) by mouth 2 (two) times daily. Antonieta IbaGollan, Timothy J, MD Taking Active   ?montelukast (SINGULAIR) 10 MG tablet [id number] Yes Take 1 tablet (10 mg total) by mouth at bedtime. Karen Foster Foster, Karen, NP Taking Active   ?pantoprazole (PROTONIX) 20 MG tablet [id number] Yes Take 1 tablet (20 mg total) by mouth 2 (two) times daily. Karen Foster Foster, Karen, NP Taking Active   ?QUEtiapine (SEROQUEL) 25 MG tablet [id number] Yes Take 1 tablet (25 mg total) by mouth at bedtime. To be taken with 50mg  at bedtime Karen Foster Foster, Karen, NP Taking  Active   ?QUEtiapine (SEROQUEL) 50 MG tablet [id number] Yes Take 1 tablet (50 mg total) by mouth 2 (two) times daily. To be taken in the morning Karen Foster Grooms, NP Taking Active   ?rosuvastatin (CRESTOR) 10 MG tablet [id number] Yes Take 1 tablet (10 mg total) by mouth daily. Karen Foster Grooms, NP Taking  Active   ?SPIRIVA RESPIMAT 2.5 MCG/ACT AERS [id number] Yes INHALE 2 SPRAY(S) BY MOUTH ONCE DAILY Karen Foster Grooms, NP Taking Active   ? ?  ?  ? ?  ? ? ?Patient Active Problem List  ? Diagnosis Date Noted  ? Other chronic pain 12/25/2020  ? Allergic rhinitis 05/26/2019  ? Urticaria 12/01/2018  ? COPD mixed type (HCC) 12/01/2018  ? Cigarette smoker 06/08/2018  ? Arthritis 02/19/2018  ? Hemorrhoids 02/18/2018  ? Reactive airway disease 02/18/2018  ? Migraines 12/12/2017  ? Deviated septum 12/03/2017  ? Heart murmur 12/03/2017  ? Depression 12/03/2017  ? Panic disorder   ? ? ?Conditions to be addressed/monitored per PCP order:  HLD and COPD ? ?Care Plan : RN Care Manager Plan of Care  ?Updates made by Heidi Dach, RN since 06/20/2021 12:00 AM  ?  ? ?Problem: Chronic health managment needs related to COPD   ?  ? ?Long-Range Goal: Development of Plan of Care to address health managment needs related to COPD   ?Start Date: 01/27/2021  ?Expected End Date: 07/30/2021  ?Priority: High  ?Note:   ?Current Barriers:  ?Chronic Disease Management support and education needs related to HLD ?[name] did not attend Lung CT and plans to call to reschedule missed appointment. She attended appointment with Cardiology on 06/09/21. [name] recently moved with people from her church and had to leave behind her groceries that she had purchased for the month. She will procure permanent housing once determination for survivor benefits is final.  ? ?RNCM Clinical Goal(s):  ?Patient will verbalize understanding of plan for management of HLD as evidenced by patient verbalization and self monitoring activity ?take all medications exactly as prescribed and will call provider for medication related questions as evidenced by documentation in EMR    ?attend all scheduled medical appointments: 06/30/21 for Echocardiogram  as evidenced by provider documentation in EMR        ?continue to work with Medical illustrator and/or Social Worker to address care  management and care coordination needs related to HLD as evidenced by adherence to CM Team Scheduled appointments     through collaboration with Medical illustrator, provider, and care team.  ? ?Interventions: ?Inter-disciplinary care team collaboration (see longitudinal plan of care) ?Evaluation of current treatment plan related to  self management and patient's adherence to plan as established by provider ?Discussed the importance in attending scheduled appointments and rescheduling Lung CT ?Provided therapeutic listening ?Care Guide referral for local food pantries ? ? ?COPD Interventions:  (Status:  Goal on track:  NO.) Long Term Goal ?Provided education about and advised patient to utilize infection prevention strategies to reduce risk of respiratory infection ?Assessed social determinant of health barriers ?Advised patient to reschedule and attend Lung CT ? ?Hyperlipidemia:  (Status: Condition stable. Not addressed this visit.) Long Term Goal  ?Lab Results  ?Component Value Date  ? CHOL 137 02/14/2021  ? HDL 22 (L) 02/14/2021  ? LDLCALC 46 02/14/2021  ? TRIG 470 (H) 02/14/2021  ? CHOLHDL 6.2 (H) 02/14/2021  ?  ? ?Medication review performed; medication list updated in electronic medical record.  ?Counseled on importance of regular  laboratory monitoring as prescribed; ?Provided HLD educational materials; ?Reviewed exercise goals and target of 150 minutes per week; ?Assessed social determinant of health barriers;  ?Encouraged patient to discuss any concerns or questions with provider ? ?Patient Goals/Self-Care Activities: ?Take medications as prescribed   ?Attend all scheduled provider appointments ?Call pharmacy for medication refills 3-7 days in advance of running out of medications ?Attend church or other social activities ?Perform all self care activities independently  ?Call provider office for new concerns or questions  ?- adhere to prescribed diet: heart healthy ?- develop an exercise routine ? ? ?   ? ? ?Follow Up:  Patient agrees to Care Plan and Follow-up. ? ?Plan: The Managed Medicaid care management team will reach out to the patient again over the next 30 days. ? ?Date/time of next scheduled RN car

## 2021-06-20 NOTE — Patient Instructions (Signed)
Visit Information ? ?Ms. Karner was given information about Medicaid Managed Care team care coordination services as a part of their [facility] Medicaid benefit. [name] verbally consented to engagement with the [facility] Managed Care team.  ? ?If you are experiencing a medical emergency, please call 911 or report to your local emergency department or urgent care.  ? ?If you have a non-emergency medical problem during routine business hours, please contact your provider's office and ask to speak with a nurse.  ? ?For questions related to your [facility] health plan, please call: [phone] or go here:[email or web address] ? ?If you would like to schedule transportation through your [facility] plan, please call the following number at least 2 days in advance of your appointment: [phone]. ? You can also use the MTM portal or MTM mobile app to manage your rides. For the portal, please go to [email or web address] ? ?Call the Behavioral Health Crisis Line at 6306065433, at any time, 24 hours a day, 7 days a week. If you are in danger or need immediate medical attention call 911. ? ?If you would like help to quit smoking, call 1-800-QUIT-NOW (704-547-3243) OR Espa?ol: 1-855-D?jelo-Ya 703-432-9800) o para m?s informaci?n haga clic aqu? or Text READY to 200-400 to register via text ? ?Ms. [place], ? ? ?Please see education materials related to COPD and HLD provided by MyChart link. ? ?Patient verbalizes understanding of instructions and care plan provided today and agrees to view in MyChart. Active MyChart status confirmed with patient.   ? ?Telephone follow up appointment with Managed Medicaid care management team member scheduled for:07/21/21 @ 11:15am ? ?Estanislado Emms RN, BSN ?[place]  [facility] ?RN Care Coordinator ? ? ?Following is a copy of your plan of care:  ?Care Plan : RN Care Manager Plan of Care  ?Updates made by Heidi Dach, RN since 06/20/2021 12:00 AM  ?   ? ?Problem: Chronic health managment needs related to COPD   ?  ? ?Long-Range Goal: Development of Plan of Care to address health managment needs related to COPD   ?Start Date: 01/27/2021  ?Expected End Date: 07/30/2021  ?Priority: High  ?Note:   ?Current Barriers:  ?Chronic Disease Management support and education needs related to HLD ?Ms. Czaja did not attend Lung CT and plans to call to reschedule missed appointment. She attended appointment with Cardiology on 06/09/21. Ms. Jedlicka recently moved with people from her church and had to leave behind her groceries that she had purchased for the month. She will procure permanent housing once determination for survivor benefits is final.  ? ?RNCM Clinical Goal(s):  ?Patient will verbalize understanding of plan for management of HLD as evidenced by patient verbalization and self monitoring activity ?take all medications exactly as prescribed and will call provider for medication related questions as evidenced by documentation in EMR    ?attend all scheduled medical appointments: 06/30/21 for Echocardiogram  as evidenced by provider documentation in EMR        ?continue to work with Medical illustrator and/or Social Worker to address care management and care coordination needs related to HLD as evidenced by adherence to CM Team Scheduled appointments     through collaboration with Medical illustrator, provider, and care team.  ? ?Interventions: ?Inter-disciplinary care team collaboration (see longitudinal plan of care) ?Evaluation of current treatment plan related to  self management and patient's adherence to plan as established by provider ?Discussed the importance in attending scheduled appointments and rescheduling Lung CT ?Provided  therapeutic listening ?Care Guide referral for local food pantries ? ? ?COPD Interventions:  (Status:  Goal on track:  NO.) Long Term Goal ?Provided education about and advised patient to utilize infection prevention strategies to reduce risk of  respiratory infection ?Assessed social determinant of health barriers ?Advised patient to reschedule and attend Lung CT ? ?Hyperlipidemia:  (Status: Condition stable. Not addressed this visit.) Long Term Goal  ?Lab Results  ?Component Value Date  ? CHOL 137 02/14/2021  ? HDL 22 (L) 02/14/2021  ? LDLCALC 46 02/14/2021  ? TRIG 470 (H) 02/14/2021  ? CHOLHDL 6.2 (H) 02/14/2021  ?  ? ?Medication review performed; medication list updated in electronic medical record.  ?Counseled on importance of regular laboratory monitoring as prescribed; ?Provided HLD educational materials; ?Reviewed exercise goals and target of 150 minutes per week; ?Assessed social determinant of health barriers;  ?Encouraged patient to discuss any concerns or questions with provider ? ?Patient Goals/Self-Care Activities: ?Take medications as prescribed   ?Attend all scheduled provider appointments ?Call pharmacy for medication refills 3-7 days in advance of running out of medications ?Attend church or other social activities ?Perform all self care activities independently  ?Call provider office for new concerns or questions  ?- adhere to prescribed diet: heart healthy ?- develop an exercise routine ? ? ?  ?  ?

## 2021-06-21 ENCOUNTER — Other Ambulatory Visit: Payer: Self-pay | Admitting: Nurse Practitioner

## 2021-06-21 MED ORDER — CYCLOBENZAPRINE HCL 10 MG PO TABS: 10.0000 mg | ORAL_TABLET | Freq: Three times a day (TID) | ORAL | 0 refills | Status: DC | PRN

## 2021-06-21 NOTE — Progress Notes (Signed)
Refill request per Call Team. ?

## 2021-06-23 NOTE — Telephone Encounter (Signed)
Requested medication (s) are due for refill today: no ? ?Requested medication (s) are on the active medication list: yes ? ?Last refill:  06/21/21 ? ?Future visit scheduled: yes ? ?Notes to clinic:  Unable to refill per protocol, cannot delegate. Medication was refilled 06/21/21 by PCP. This is duplicate request. ? ? ? ?  ?Requested Prescriptions  ?Pending Prescriptions Disp Refills  ? cyclobenzaprine (FLEXERIL) 10 MG tablet [Pharmacy Med Name: Cyclobenzaprine HCl 10 MG Oral Tablet] 90 tablet 0  ?  Sig: Take 1 tablet by mouth three times daily as needed for muscle spasm  ?  ? Not Delegated - Analgesics:  Muscle Relaxants Failed - 06/21/2021 10:17 AM  ?  ?  Failed - This refill cannot be delegated  ?  ?  Passed - Valid encounter within last 6 months  ?  Recent Outpatient Visits   ? ?      ? 3 weeks ago Severe episode of recurrent major depressive disorder, without psychotic features (HCC)  ? [facility] Larae Grooms, NP  ? 1 month ago COPD mixed type [facility])  ? [facility] Larae Grooms, NP  ? 2 months ago COPD mixed type [facility])  ? [facility] Larae Grooms, NP  ? 3 months ago Ginette Pitman  ? [facility] Larae Grooms, NP  ? 4 months ago Annual physical exam  ? [facility] Larae Grooms, NP  ? ?  ?  ?Future Appointments   ? ?        ? In 2 months Larae Grooms, NP [facility]  ? ?  ? ? ?  ?  ?  ? ? ?

## 2021-06-24 ENCOUNTER — Telehealth: Payer: Self-pay

## 2021-06-24 NOTE — Congregational Nurse Program (Signed)
?  Dept: [phone] ? ? ?Congregational Nurse Program Note ? ?Date of Encounter: 06/24/2021 ?Client to clinic for vital sign check and support. ?BP 110/70 (BP Location: Left Arm, Patient Position: Sitting, Cuff Size: Normal)   Pulse 83   SpO2 97%  ?She has decreased per MD instruction, her metoprolol to 25 mg BID. She reports semi stable housing at this time. ?She has an upcoming Echocardiogram on 5/1 and does have transportation. ? ?Past Medical History: ?Past Medical History:  ?Diagnosis Date  ? Allergy   ? Anxiety   ? Arthritis   ? Depression   ? GERD (gastroesophageal reflux disease)   ? Glaucoma   ? Heart murmur   ? Kidney stones   ? Migraines   ? Panic attack   ? Panic attack   ? ? ?Encounter Details: ? CNP Questionnaire - 06/24/21 1200   ? ?  ? Questionnaire  ? Do you give verbal consent to treat you today? Yes   ? Location Patient Served  [place]   ? Visit Setting Church or Organization   ? Patient Status Homeless   client reports she is currenlty living in a camper, near a [place]. She has access to a bathroom and shower  ? Insurance Medicaid   well care  ? Insurance Referral N/A   ? Medication N/A   no copay for meds with her current insurance  ? Medical Provider Yes   [facility]  ? Screening Referrals N/A   again reinforced the importance of a mammogram. [place] has not rescheduled as of today.  ? Medical Referral N/A   ? Medical Appointment Made N/A   ? Food N/A   has food stamps  ? Transportation N/A   has friends that provide trnasportation, or the [place] bus  ? Housing/Utilities No permanent housing   currenlty living in a car on a  friends property  ? Interpersonal Safety N/A   ? Intervention Educate;Support;Blood pressure   ? ED Visit Averted N/A   ? Life-Saving Intervention Made N/A   ? ?  ?  ? ?  ? ? ? ? ?

## 2021-06-24 NOTE — Telephone Encounter (Signed)
? ?  Telephone encounter was:  Successful.  ?06/24/2021 ?Name: Karen Foster MRN: [id number] DOB: [birth year] ? ?Karen Foster is a 61 y.o. year old female who is a primary care patient of Jon Billings, NP . The community resource team was consulted for assistance with Food Insecurity ? ?Care guide performed the following interventions: Received email from patient confirming receipt of food pantry list sent.   ? ?Follow Up Plan:  No further follow up planned at this time. The patient has been provided with needed resources. ? ?Karen Foster, [place], CHC ?Care Guide  Embedded Care Coordination ?[facility]  ?[address] ?[place], [place] [address] ???[email or web address]  ?? [id number]   ?[email or web address] ?  ?

## 2021-06-24 NOTE — Telephone Encounter (Signed)
? ?  Telephone encounter was:  Successful.  ?06/24/2021 ?Name: ANECIA NUSBAUM MRN: [id number] DOB: 11/17/1960 ? ?DODIE PARISI is a 61 y.o. year old female who is a primary care patient of Larae Grooms, NP . The community resource team was consulted for assistance with Food Insecurity ? ?Care guide performed the following interventions: Spoke with patient verified email address sent [facility] food pantry list per request from patient. ? ?Follow Up Plan:  Care guide will follow up with patient by phone over the next 7 days. ? ?Jayron Maqueda, AAS Paralegal, CHC ?Care Guide  Embedded Care Coordination ?  Care Management  ?[address] ?[place], [place] [address] ?[email or web address]  ?? [phone]   ?[email or web address] ?  ?

## 2021-06-25 ENCOUNTER — Other Ambulatory Visit: Payer: Medicaid Other

## 2021-06-30 ENCOUNTER — Ambulatory Visit (INDEPENDENT_AMBULATORY_CARE_PROVIDER_SITE_OTHER): Payer: Medicaid Other

## 2021-06-30 ENCOUNTER — Other Ambulatory Visit: Payer: Self-pay

## 2021-06-30 ENCOUNTER — Telehealth: Payer: Self-pay | Admitting: Cardiovascular Disease

## 2021-06-30 DIAGNOSIS — I088 Other rheumatic multiple valve diseases: Secondary | ICD-10-CM

## 2021-06-30 DIAGNOSIS — I341 Nonrheumatic mitral (valve) prolapse: Secondary | ICD-10-CM

## 2021-06-30 DIAGNOSIS — Z419 Encounter for procedure for purposes other than remedying health state, unspecified: Secondary | ICD-10-CM | POA: Diagnosis not present

## 2021-06-30 DIAGNOSIS — I5030 Unspecified diastolic (congestive) heart failure: Secondary | ICD-10-CM

## 2021-06-30 LAB — ECHOCARDIOGRAM COMPLETE
AR max vel: 2.2 cm2
AV Area VTI: 2.26 cm2
AV Area mean vel: 2.13 cm2
AV Mean grad: 2 mmHg
AV Peak grad: 3.6 mmHg
Ao pk vel: 0.95 m/s
Area-P 1/2: 4.16 cm2
Calc EF: 58.3 %
S' Lateral: 2.6 cm
Single Plane A2C EF: 57.9 %
Single Plane A4C EF: 59.4 %

## 2021-06-30 MED ORDER — METOPROLOL TARTRATE 25 MG PO TABS: 25.0000 mg | ORAL_TABLET | Freq: Two times a day (BID) | ORAL | 3 refills | Status: DC

## 2021-06-30 NOTE — Telephone Encounter (Signed)
metoprolol tartrate (LOPRESSOR) 25 MG tablet 180 tablet 3 06/30/2021 06/30/2022   ?Sig - Route: Take 1 tablet (25 mg total) by mouth 2 (two) times daily. - Oral   ?Sent to pharmacy as: metoprolol tartrate (LOPRESSOR) 25 MG tablet   ?E-Prescribing Status: Sent to pharmacy (06/30/2021  2:33 PM EDT)   ? ?Pharmacy ? ?[id number] - [place] ([place] [place] - [address]  ? ?

## 2021-06-30 NOTE — Telephone Encounter (Signed)
?*  STAT* If patient is at the pharmacy, call can be transferred to refill team. ? ? ?1. Which medications need to be refilled? (please list name of each medication and dose if known) metoprolol 25 mg po BID ? ?2. Which pharmacy/location (including street and city if local pharmacy) is [facility] [place] [place]  ? ?3. Do they need a 30 day or 90 day supply? 90 ? ?PLEASE CHANGE OLD RX .  Patient states 50 mg tablets are hard to cut so please send new rx so she doesn't have to do so.  ? ?

## 2021-07-01 ENCOUNTER — Telehealth: Payer: Self-pay | Admitting: Emergency Medicine

## 2021-07-01 NOTE — Telephone Encounter (Signed)
-----   Message from Minna Merritts, MD sent at 06/30/2021  5:42 PM EDT ----- ?Echocardiogram ?Normal LV and RV size and function ?No significant valvular regurgitation ?Mild prolapse of mitral valve but not much regurgitation ?

## 2021-07-01 NOTE — Telephone Encounter (Signed)
Called and spoke with patient. Results reviewed with patient, pt verbalized understanding,  questions (if any) answered.   ?

## 2021-07-05 ENCOUNTER — Other Ambulatory Visit: Payer: Self-pay | Admitting: Nurse Practitioner

## 2021-07-07 NOTE — Telephone Encounter (Signed)
Rx 02/14/21 #90 1RF- too soon ?Requested Prescriptions  ?Pending Prescriptions Disp Refills  ?? rosuvastatin (CRESTOR) 10 MG tablet [Pharmacy Med Name: Rosuvastatin Calcium 10 MG Oral Tablet] 90 tablet 0  ?  Sig: Take 1 tablet by mouth once daily  ?  ? Cardiovascular:  Antilipid - Statins 2 Failed - 07/05/2021  2:54 PM  ?  ?  Failed - Cr in normal range and within 360 days  ?  Creatinine, Ser  ?Date Value Ref Range Status  ?02/14/2021 0.55 (L) 0.57 - 1.00 mg/dL Final  ?   ?  ?  Failed - Lipid Panel in normal range within the last 12 months  ?  Cholesterol, Total  ?Date Value Ref Range Status  ?02/14/2021 137 100 - 199 mg/dL Final  ? ?LDL Chol Calc (NIH)  ?Date Value Ref Range Status  ?02/14/2021 46 0 - 99 mg/dL Final  ? ?HDL  ?Date Value Ref Range Status  ?02/14/2021 22 (L) >39 mg/dL Final  ? ?Triglycerides  ?Date Value Ref Range Status  ?02/14/2021 470 (H) 0 - 149 mg/dL Final  ? ?  ?  ?  Passed - Patient is not pregnant  ?  ?  Passed - Valid encounter within last 12 months  ?  Recent Outpatient Visits   ?      ? 1 month ago Severe episode of recurrent major depressive disorder, without psychotic features (HCC)  ? [facility] Larae Grooms, NP  ? 1 month ago COPD mixed type [facility])  ? [facility] Larae Grooms, NP  ? 2 months ago COPD mixed type [facility])  ? [facility] Larae Grooms, NP  ? 4 months ago Ginette Pitman  ? [facility] Larae Grooms, NP  ? 4 months ago Annual physical exam  ? [facility] Larae Grooms, NP  ?  ?  ?Future Appointments   ?        ? In 1 month Larae Grooms, NP [facility]  ?  ? ?  ?  ?  ? ?

## 2021-07-09 NOTE — Congregational Nurse Program (Signed)
?  Dept: [phone] ? ? ?Congregational Nurse Program Note ? ?Date of Encounter: 07/09/2021 ?Client to clinic for vital sign check and support. No new concerns at this time. She reports doing well and feeling well. ?BP 110/82 (BP Location: Left Arm, Patient Position: Sitting, Cuff Size: Normal)   Pulse 86   SpO2 98% Comment: room air ? ?Past Medical History: ?Past Medical History:  ?Diagnosis Date  ? Allergy   ? Anxiety   ? Arthritis   ? Depression   ? GERD (gastroesophageal reflux disease)   ? Glaucoma   ? Heart murmur   ? Kidney stones   ? Migraines   ? Panic attack   ? Panic attack   ? ? ?Encounter Details: ? CNP Questionnaire - 07/09/21 1030   ? ?  ? Questionnaire  ? Do you give verbal consent to treat you today? Yes   ? Location Patient Served  [facility]   ? Visit Setting Church or Organization   ? Patient Status Homeless   client reports she is currenlty living in a camper, [place] a [place]. She has access to a bathroom and shower  ? Insurance Medicaid   well care  ? Insurance Referral N/A   ? Medication N/A   no copay for meds with her current insurance  ? Medical Provider Yes   [facility]  ? Screening Referrals N/A   again reinforced the importance of a mammogram. [place] has not rescheduled as of today.  ? Medical Referral N/A   ? Medical Appointment Made N/A   ? Food N/A   has food stamps  ? Transportation N/A   has friends that provide trnasportation, or the [place] bus  ? Housing/Utilities No permanent housing   currenlty living in a car on a  friends property  ? Interpersonal Safety N/A   ? Intervention Support;Blood pressure   ? ED Visit Averted N/A   ? Life-Saving Intervention Made N/A   ? ?  ?  ? ?  ? ? ? ? ?

## 2021-07-12 ENCOUNTER — Telehealth: Payer: Self-pay | Admitting: Nurse Practitioner

## 2021-07-12 NOTE — Telephone Encounter (Signed)
Nursing Call Team reached out to on call provider around 1700 today reporting that this patient was demanding that some codeine cough syrup be sent in for her.  She reported to call team that it is well documented in her chart that she needs this when sick and she gets sick often this time of year.  Discussed with call team nurse to alert patient since this contains a controlled substance we do not fill this without visit and on-call providers do not fill controlled substances.  If patient still not well on Monday then recommend visit with her PCP or urgent care this weekend.   ?

## 2021-07-15 ENCOUNTER — Ambulatory Visit: Payer: Self-pay | Admitting: *Deleted

## 2021-07-15 ENCOUNTER — Telehealth: Payer: Medicaid Other | Admitting: Family Medicine

## 2021-07-15 DIAGNOSIS — R053 Chronic cough: Secondary | ICD-10-CM | POA: Diagnosis not present

## 2021-07-15 DIAGNOSIS — J449 Chronic obstructive pulmonary disease, unspecified: Secondary | ICD-10-CM

## 2021-07-15 MED ORDER — PREDNISONE 20 MG PO TABS: 40.0000 mg | ORAL_TABLET | Freq: Every day | ORAL | 0 refills | Status: AC

## 2021-07-15 MED ORDER — PROMETHAZINE-DM 6.25-15 MG/5ML PO SYRP: 5.0000 mL | ORAL_SOLUTION | Freq: Three times a day (TID) | ORAL | 0 refills | Status: DC | PRN

## 2021-07-15 NOTE — Telephone Encounter (Signed)
Pt called in stating she has been having a bad cough, and wanted to speak with a nurse, pt also mentioned a medicine but there were no available appts, please advise.  ? ?Chief Complaint: Cough ?Symptoms: productive cough for greenish phlegm. ?Frequency: Friday ?Pertinent Negatives: Patient denies fever, SOB,sinus pain/pressure ?Disposition: [] ED /[] Urgent Care (no appt availability in office) / [] Appointment(In office/virtual)/ [x]  [facility]/ [] Home Care/ [] Refused Recommended Disposition /[] [place] [facility]/ []  Follow-up with PCP ?Additional Notes: Pt requesting PCP call in "Robitussin with Codeine"   States " has to call this in for me 2 times a year. Advised would need to be seen as is controlled med. Offered appt tomorrow, states can not get in to appt. Assisted my with MyChart E-visit. Care advise provided. ? ? ? ? ? ?Reason for Disposition ? [1] Continuous (nonstop) coughing interferes with work or school AND [2] no improvement using cough treatment per Care Advice ? ?Answer Assessment - Initial Assessment Questions ?1. ONSET: "When did the cough begin?"  ?    Friday ?2. SEVERITY: "How bad is the cough today?"  ?    Bad spells day and night, more frequent. ?3. SPUTUM: "Describe the color of your sputum" (none, dry cough; clear, white, yellow, green) ?    Greenish ?4. HEMOPTYSIS: "Are you coughing up any blood?" If so ask: "How much?" (flecks, streaks, tablespoons, etc.) ?    no ?5. DIFFICULTY BREATHING: "Are you having difficulty breathing?" If Yes, ask: "How bad is it?" (e.g., mild, moderate, severe)  ?  - MILD: No SOB at rest, mild SOB with walking, speaks normally in sentences, can lie down, no retractions, pulse < 100.  ?  - MODERATE: SOB at rest, SOB with minimal exertion and prefers to sit, cannot lie down flat, speaks in phrases, mild retractions, audible wheezing, pulse 100-120.  ?  - SEVERE: Very SOB at rest, speaks in single words, struggling to breathe, sitting hunched  forward, retractions, pulse > 120  ?    no ?6. FEVER: "Do you have a fever?" If Yes, ask: "What is your temperature, how was it measured, and when did it start?" ?    no ?7. CARDIAC HISTORY: "Do you have any history of heart disease?" (e.g., heart attack, congestive heart failure)  ?     ?8. LUNG HISTORY: "Do you have any history of lung disease?"  (e.g., pulmonary embolus, asthma, emphysema) ?     ?9. PE RISK FACTORS: "Do you have a history of blood clots?" (or: recent major surgery, recent prolonged travel, bedridden) ?     ?10. OTHER SYMPTOMS: "Do you have any other symptoms?" (e.g., runny nose, wheezing, chest pain) ?      No ? ?Protocols used: Cough - Acute Productive-A-AH ? ?

## 2021-07-15 NOTE — Progress Notes (Signed)
We are sorry that you are not feeling well.  Here is how we plan to help! ? ?Based on your presentation I believe you most likely have A cough due to a virus.  This is called viral bronchitis and is best treated by rest, plenty of fluids and control of the cough.  You may use Ibuprofen or Tylenol as directed to help your symptoms.   ?  ?In addition you may use A non-prescription cough medication called Robitussin DAC. Take 2 teaspoons every 8 hours or Delsym: take 2 teaspoons every 12 hours. and A non-prescription cough medication called Mucinex DM: take 2 tablets every 12 hours. ? ?Prednisone 10 mg daily for 6 days (see taper instructions below) ? ?From your responses in the eVisit questionnaire you describe inflammation in the upper respiratory tract which is causing a significant cough.  This is commonly called Bronchitis and has four common causes:   ?Allergies ?Viral Infections ?Acid Reflux ?Bacterial Infection ?Allergies, viruses and acid reflux are treated by controlling symptoms or eliminating the cause. An example might be a cough caused by taking certain blood pressure medications. You stop the cough by changing the medication. Another example might be a cough caused by acid reflux. Controlling the reflux helps control the cough. ? ?USE OF BRONCHODILATOR ("RESCUE") INHALERS: ?There is a risk from using your bronchodilator too frequently.  The risk is that over-reliance on a medication which only relaxes the muscles surrounding the breathing tubes can reduce the effectiveness of medications prescribed to reduce swelling and congestion of the tubes themselves.  Although you feel brief relief from the bronchodilator inhaler, your asthma may actually be worsening with the tubes becoming more swollen and filled with mucus.  This can delay other crucial treatments, such as oral steroid medications. If you need to use a bronchodilator inhaler daily, several times per day, you should discuss this with your  provider.  There are probably better treatments that could be used to keep your asthma under control.  ?   ?HOME CARE ?Only take medications as instructed by your medical team. ?Complete the entire course of an antibiotic. ?Drink plenty of fluids and get plenty of rest. ?Avoid close contacts especially the very young and the elderly ?Cover your mouth if you cough or cough into your sleeve. ?Always remember to wash your hands ?A steam or ultrasonic humidifier can help congestion.  ? ?GET HELP RIGHT AWAY IF: ?You develop worsening fever. ?You become short of breath ?You cough up blood. ?Your symptoms persist after you have completed your treatment plan ?MAKE SURE YOU  ?Understand these instructions. ?Will watch your condition. ?Will get help right away if you are not doing well or get worse. ?  ? ?Thank you for choosing an e-visit. ? ?Your e-visit answers were reviewed by a board certified advanced clinical practitioner to complete your personal care plan. Depending upon the condition, your plan could have included both over the counter or prescription medications. ? ?Please review your pharmacy choice. Make sure the pharmacy is open so you can pick up prescription now. If there is a problem, you may contact your provider through [employer] and have the prescription routed to another pharmacy.  Your safety is important to [place]. If you have drug allergies check your prescription carefully.  ? ?For the next 24 hours you can use MyChart to ask questions about today's visit, request a non-urgent call back, or ask for a work or school excuse. ?You will get an email in the  next two days asking about your experience. I hope that your e-visit has been valuable and will speed your recovery. ? ?I provided 5 minutes of non face-to-face time during this encounter for chart review, medication and order placement, as well as and documentation.  ? ?

## 2021-07-16 ENCOUNTER — Ambulatory Visit: Payer: Medicaid Other | Admitting: Internal Medicine

## 2021-07-19 ENCOUNTER — Other Ambulatory Visit: Payer: Self-pay | Admitting: Nurse Practitioner

## 2021-07-20 ENCOUNTER — Other Ambulatory Visit: Payer: Self-pay | Admitting: Nurse Practitioner

## 2021-07-21 ENCOUNTER — Telehealth: Payer: Self-pay

## 2021-07-21 ENCOUNTER — Other Ambulatory Visit: Payer: Self-pay | Admitting: Nurse Practitioner

## 2021-07-21 ENCOUNTER — Other Ambulatory Visit: Payer: Self-pay | Admitting: *Deleted

## 2021-07-21 MED ORDER — ROSUVASTATIN CALCIUM 10 MG PO TABS: 10.0000 mg | ORAL_TABLET | Freq: Every day | ORAL | 1 refills | Status: DC

## 2021-07-21 NOTE — Telephone Encounter (Signed)
Medication Refill - Medication: gabapentin (NEURONTIN) 300 MG capsule [[id number]]   cyclobenzaprine (FLEXERIL) 10 MG tablet [[id number]]   Has the patient contacted their pharmacy? Yes.     Preferred Pharmacy (with phone number or street name):  [facility] [address] ([address]), [place] - [address]  [address]) [place] [address]  Phone: [phone] Fax: [phone]  Hours: Not open 24 hours     Has the patient been seen for an appointment in the last year OR does the patient have an upcoming appointment? Yes.    Agent: Please be advised that RX refills may take up to 3 business days. We ask that you follow-up with your pharmacy.

## 2021-07-21 NOTE — Patient Outreach (Signed)
Care Coordination  07/21/2021  Karen Foster 29-Nov-1960 [id number]    Medicaid Managed Care   Unsuccessful Outreach Note  07/21/2021 Name: Karen Foster MRN: [id number] DOB: 04-26-1960  Referred by: Larae Grooms, NP Reason for referral : High Risk Managed Medicaid (Unsuccessful RNCM follow up telephone outreach)   An unsuccessful telephone outreach was attempted today. The patient was referred to the case management team for assistance with care management and care coordination.   Follow Up Plan: A HIPAA compliant phone message was left for the patient providing contact information and requesting a return call.    Estanislado Emms RN, BSN   [facility]

## 2021-07-21 NOTE — Telephone Encounter (Signed)
Refilled 07/21/2021 #90 1 refill. Requested Prescriptions  Pending Prescriptions Disp Refills  . rosuvastatin (CRESTOR) 10 MG tablet [Pharmacy Med Name: Rosuvastatin Calcium 10 MG Oral Tablet] 90 tablet 0    Sig: Take 1 tablet by mouth once daily     Cardiovascular:  Antilipid - Statins 2 Failed - 07/19/2021 12:08 PM      Failed - Cr in normal range and within 360 days    Creatinine, Ser  Date Value Ref Range Status  02/14/2021 0.55 (L) 0.57 - 1.00 mg/dL Final         Failed - Lipid Panel in normal range within the last 12 months    Cholesterol, Total  Date Value Ref Range Status  02/14/2021 137 100 - 199 mg/dL Final   LDL Chol Calc (NIH)  Date Value Ref Range Status  02/14/2021 46 0 - 99 mg/dL Final   HDL  Date Value Ref Range Status  02/14/2021 22 (L) >39 mg/dL Final   Triglycerides  Date Value Ref Range Status  02/14/2021 470 (H) 0 - 149 mg/dL Final         Passed - Patient is not pregnant      Passed - Valid encounter within last 12 months    Recent Outpatient Visits          1 month ago Severe episode of recurrent major depressive disorder, without psychotic features (HCC)   [facility] Larae Grooms, NP   2 months ago COPD mixed type [facility])   [facility] Larae Grooms, NP   3 months ago COPD mixed type [facility])   [facility] Larae Grooms, NP   4 months ago Thrush   [facility] Larae Grooms, NP   5 months ago Annual physical exam   [facility] Larae Grooms, NP      Future Appointments            In 1 month Larae Grooms, NP [facility], [facility]

## 2021-07-21 NOTE — Telephone Encounter (Signed)
Please let patient know that we increased the dose of her Crestor to 10mg  at her last visit.  I sent the refill in.

## 2021-07-21 NOTE — Telephone Encounter (Signed)
[facility]atin 5MG . Last filled 02/14/21  #90 with 1 refill . Please advise. Next OV 08/22/21

## 2021-07-21 NOTE — Patient Instructions (Signed)
Visit Information  Ms. Tessica A Zarrella  - as a part of your Medicaid benefit, you are eligible for care management and care coordination services at no cost or copay. I was unable to reach you by phone today but would be happy to help you with your health related needs. Please feel free to call me @ [phone].   A member of the Managed Medicaid care management team will reach out to you again over the next 14 days.   Gerrick Ray RN, BSN [place]  [facility] RN Care Coordinator   

## 2021-07-21 NOTE — Telephone Encounter (Signed)
Pt has been advised, pt also requesting refill request for gabapentin and flexeril, please advise.

## 2021-07-22 MED ORDER — GABAPENTIN 300 MG PO CAPS: ORAL_CAPSULE | ORAL | 1 refills | Status: DC

## 2021-07-22 MED ORDER — CYCLOBENZAPRINE HCL 10 MG PO TABS: 10.0000 mg | ORAL_TABLET | Freq: Three times a day (TID) | ORAL | 0 refills | Status: DC | PRN

## 2021-07-22 NOTE — Telephone Encounter (Signed)
Requested medication (s) are due for refill today: yes  Requested medication (s) are on the active medication list: prescription ended 07/22/21  Last refill:  06/21/21  Future visit scheduled: yes  Notes to clinic:  med not delegated to NT to RF   Requested Prescriptions  Pending Prescriptions Disp Refills   cyclobenzaprine (FLEXERIL) 10 MG tablet [Pharmacy Med Name: Cyclobenzaprine HCl 10 MG Oral Tablet] 90 tablet 0    Sig: Take 1 tablet by mouth three times daily as needed for muscle spasm     Not Delegated - Analgesics:  Muscle Relaxants Failed - 07/20/2021 10:29 AM      Failed - This refill cannot be delegated      Passed - Valid encounter within last 6 months    Recent Outpatient Visits           1 month ago Severe episode of recurrent major depressive disorder, without psychotic features (HCC)   [facility] Larae Grooms, NP   2 months ago COPD mixed type [facility])   [facility] Larae Grooms, NP   3 months ago COPD mixed type [facility])   [facility] Larae Grooms, NP   4 months ago Thrush   [facility] Larae Grooms, NP   5 months ago Annual physical exam   [facility] Larae Grooms, NP       Future Appointments             In 1 month Larae Grooms, NP [facility], [facility]

## 2021-07-22 NOTE — Telephone Encounter (Signed)
Medication sent to the pharmacy.

## 2021-07-22 NOTE — Telephone Encounter (Signed)
Med refilled today 07/22/21  Requested Prescriptions  Refused Prescriptions Disp Refills  . gabapentin (NEURONTIN) 300 MG capsule [Pharmacy Med Name: Gabapentin 300 MG Oral Capsule] 120 capsule 0    Sig: Take 1 capsule by mouth 4 times daily     Neurology: Anticonvulsants - gabapentin Failed - 07/20/2021 10:29 AM      Failed - Cr in normal range and within 360 days    Creatinine, Ser  Date Value Ref Range Status  02/14/2021 0.55 (L) 0.57 - 1.00 mg/dL Final         Passed - Completed PHQ-2 or PHQ-9 in the last 360 days      Passed - Valid encounter within last 12 months    Recent Outpatient Visits          1 month ago Severe episode of recurrent major depressive disorder, without psychotic features ([place])   [place], Karen, NP   2 months ago COPD mixed type [facility])   [facility] Jon Billings, NP   3 months ago COPD mixed type [facility])   [facility] Jon Billings, NP   4 months ago [place], Karen, NP   5 months ago Annual physical exam   [facility] Jon Billings, NP      Future Appointments            In 1 month Jon Billings, NP [facility], [place]

## 2021-07-22 NOTE — Addendum Note (Signed)
Addended by: Larae Grooms on: 07/22/2021 09:25 AM   Modules accepted: Orders

## 2021-07-22 NOTE — Telephone Encounter (Signed)
Requested medication (s) are due for refill today:   Provider to review  Requested medication (s) are on the active medication list:   Yes for both  Future visit scheduled:   Yes   Last ordered: Flexeril 07/22/2021 #90, 0 refills;   gabapentin 07/22/2021 #120, 0  Returned because it's a non delegated refill duplicate requests   Requested Prescriptions  Pending Prescriptions Disp Refills   cyclobenzaprine (FLEXERIL) 10 MG tablet 90 tablet 0    Sig: Take 1 tablet (10 mg total) by mouth 3 (three) times daily as needed for muscle spasms.     Not Delegated - Analgesics:  Muscle Relaxants Failed - 07/22/2021  8:01 AM      Failed - This refill cannot be delegated      Passed - Valid encounter within last 6 months    Recent Outpatient Visits           1 month ago Severe episode of recurrent major depressive disorder, without psychotic features ([place])   [place], Karen, NP   2 months ago COPD mixed type [facility])   [facility] Jon Billings, NP   3 months ago COPD mixed type [facility])   [facility] Jon Billings, NP   4 months ago [place], Santiago Glad, NP   5 months ago Annual physical exam   [facility] Jon Billings, NP       Future Appointments             In 1 month Jon Billings, NP [place], [facility]              gabapentin (NEURONTIN) 300 MG capsule 120 capsule 1    Sig: Take 1 capsule by mouth 4 times daily     Neurology: Anticonvulsants - gabapentin Failed - 07/22/2021  8:01 AM      Failed - Cr in normal range and within 360 days    Creatinine, Ser  Date Value Ref Range Status  02/14/2021 0.55 (L) 0.57 - 1.00 mg/dL Final         Passed - Completed PHQ-2 or PHQ-9 in the last 360 days      Passed - Valid encounter within last 12 months    Recent Outpatient Visits           1 month ago Severe episode of recurrent major depressive disorder,  without psychotic features ([place])   [place], Karen, NP   2 months ago COPD mixed type [facility])   [facility] Jon Billings, NP   3 months ago COPD mixed type [facility])   [facility] Jon Billings, NP   4 months ago [place], Karen, NP   5 months ago Annual physical exam   [facility] Jon Billings, NP       Future Appointments             In 1 month Jon Billings, NP [facility], [place]

## 2021-07-22 NOTE — Congregational Nurse Program (Signed)
  Dept: [phone]   Congregational Nurse Program Note  Date of Encounter: 07/22/2021 Client to clinic for blood pressure check and support. BP 117/72 (BP Location: Left Arm, Patient Position: Sitting, Cuff Size: Normal)   Pulse 70   SpO2 99%  She is currently sheltered and doing well. She did report having a visit to [facility] hospital last week d/t an unrelenting cough that "made her pass out". No cough noted today. She thinks the cough might have come from some mold in the camper she has been sleeping in, now has a new area to sleep in. No other concerns at this time. Support given.  Past Medical History: Past Medical History:  Diagnosis Date   Allergy    Anxiety    Arthritis    Depression    GERD (gastroesophageal reflux disease)    Glaucoma    Heart murmur    Kidney stones    Migraines    Panic attack    Panic attack     Encounter Details:

## 2021-07-27 ENCOUNTER — Telehealth: Payer: Medicaid Other | Admitting: Nurse Practitioner

## 2021-07-27 DIAGNOSIS — J440 Chronic obstructive pulmonary disease with acute lower respiratory infection: Secondary | ICD-10-CM

## 2021-07-27 DIAGNOSIS — R053 Chronic cough: Secondary | ICD-10-CM | POA: Diagnosis not present

## 2021-07-27 DIAGNOSIS — J209 Acute bronchitis, unspecified: Secondary | ICD-10-CM | POA: Diagnosis not present

## 2021-07-27 MED ORDER — PREDNISONE 10 MG PO TABS: 10.0000 mg | ORAL_TABLET | Freq: Every day | ORAL | 0 refills | Status: DC

## 2021-07-27 MED ORDER — PROMETHAZINE-DM 6.25-15 MG/5ML PO SYRP: 5.0000 mL | ORAL_SOLUTION | Freq: Three times a day (TID) | ORAL | 0 refills | Status: DC | PRN

## 2021-07-27 NOTE — Progress Notes (Signed)
We are sorry that you are not feeling well.  Here is how we plan to help!  Based on your presentation I believe you most likely have A cough due to a virus.  This is called viral bronchitis and is best treated by rest, plenty of fluids and control of the cough.  You may use Ibuprofen or Tylenol as directed to help your symptoms.     It also appears you are due to pick up your spiriva inhaler which has not been filled since March. If your cough persists after this round of steroids you will need to follow up with your PCP for face to face visit for an evaluation of your cough.   Prednisone 10 mg daily for 6 days (see taper instructions below)  Directions for 6 day taper: Day 1: 2 tablets before breakfast, 1 after both lunch & dinner and 2 at bedtime Day 2: 1 tab before breakfast, 1 after both lunch & dinner and 2 at bedtime Day 3: 1 tab at each meal & 1 at bedtime Day 4: 1 tab at breakfast, 1 at lunch, 1 at bedtime Day 5: 1 tab at breakfast & 1 tab at bedtime Day 6: 1 tab at breakfast  From your responses in the eVisit questionnaire you describe inflammation in the upper respiratory tract which is causing a significant cough.  This is commonly called Bronchitis and has four common causes:   Allergies Viral Infections Acid Reflux Bacterial Infection Allergies, viruses and acid reflux are treated by controlling symptoms or eliminating the cause. An example might be a cough caused by taking certain blood pressure medications. You stop the cough by changing the medication. Another example might be a cough caused by acid reflux. Controlling the reflux helps control the cough.  USE OF BRONCHODILATOR ("RESCUE") INHALERS: There is a risk from using your bronchodilator too frequently.  The risk is that over-reliance on a medication which only relaxes the muscles surrounding the breathing tubes can reduce the effectiveness of medications prescribed to reduce swelling and congestion of the tubes  themselves.  Although you feel brief relief from the bronchodilator inhaler, your asthma may actually be worsening with the tubes becoming more swollen and filled with mucus.  This can delay other crucial treatments, such as oral steroid medications. If you need to use a bronchodilator inhaler daily, several times per day, you should discuss this with your provider.  There are probably better treatments that could be used to keep your asthma under control.     HOME CARE Only take medications as instructed by your medical team. Complete the entire course of an antibiotic. Drink plenty of fluids and get plenty of rest. Avoid close contacts especially the very young and the elderly Cover your mouth if you cough or cough into your sleeve. Always remember to wash your hands A steam or ultrasonic humidifier can help congestion.   GET HELP RIGHT AWAY IF: You develop worsening fever. You become short of breath You cough up blood. Your symptoms persist after you have completed your treatment plan MAKE SURE YOU  Understand these instructions. Will watch your condition. Will get help right away if you are not doing well or get worse.    Thank you for choosing an e-visit.  Your e-visit answers were reviewed by a board certified advanced clinical practitioner to complete your personal care plan. Depending upon the condition, your plan could have included both over the counter or prescription medications.  Please review your pharmacy choice.  Make sure the pharmacy is open so you can pick up prescription now. If there is a problem, you may contact your provider through [employer] and have the prescription routed to another pharmacy.  Your safety is important to [place]. If you have drug allergies check your prescription carefully.   For the next 24 hours you can use MyChart to ask questions about today's visit, request a non-urgent call back, or ask for a work or school excuse. You will get an email  in the next two days asking about your experience. I hope that your e-visit has been valuable and will speed your recovery.

## 2021-07-27 NOTE — Addendum Note (Signed)
Addended by: Bertram Denver on: 07/27/2021 11:52 AM   Modules accepted: Orders

## 2021-07-27 NOTE — Progress Notes (Signed)
I have spent 5 minutes in review of e-visit questionnaire, review and updating patient chart, medical decision making and response to patient.  ° °Malvin Morrish W Delani Kohli, NP ° °  °

## 2021-07-29 ENCOUNTER — Ambulatory Visit: Payer: Self-pay | Admitting: *Deleted

## 2021-07-29 NOTE — Congregational Nurse Program (Signed)
  Dept: [phone]   Congregational Nurse Program Note  Date of Encounter: 07/29/2021 Client to clinic for blood pressure check and to advise she was on a prednisone taper again for her cough.  BP 118/78 (BP Location: Left Arm, Patient Position: Sitting, Cuff Size: Normal)   Pulse 97   SpO2 98%  Discussed her upcoming disability exam, 6/2 at 1:30. She does have transportation. No new health concerns at this time.  Past Medical History: Past Medical History:  Diagnosis Date   Allergy    Anxiety    Arthritis    Depression    GERD (gastroesophageal reflux disease)    Glaucoma    Heart murmur    Kidney stones    Migraines    Panic attack    Panic attack     Encounter Details:  CNP Questionnaire - 07/29/21 1156       Questionnaire   Do you give verbal consent to treat you today? Yes    Location Patient Served  [facility]    Visit Setting Church or Organization    Patient Status Unknown   client reports she is currenlty living in a camper, [place] a [place]. She has access to a bathroom and shower   Insurance Medicaid   well care   Insurance Referral N/A    Medication N/A   no copay for meds with her current insurance   Medical Provider Yes   [facility]   Screening Referrals N/A   again reinforced the importance of a mammogram. [facility] has not rescheduled as of today.   Medical Referral N/A    Medical Appointment Made N/A    Food N/A   has food stamps   Transportation N/A   has friends that provide trnasportation, or the [place] bus   Housing/Utilities No permanent housing   currenlty living in a car on a  friends property   Interpersonal Safety N/A    Intervention Support;Blood pressure;Case Management    ED Visit Averted N/A    Life-Saving Intervention Made N/A

## 2021-07-29 NOTE — Telephone Encounter (Signed)
Message from Aretta Nip sent at 07/29/2021  2:11 PM EDT  Summary: FU re paperwork re disability hearing   Pt is calling stating she is having a disability hearing on Friday and she was asked by the center if her dr would want a copy, told pt that I was sure that would be helpful but pt stated that the Medicare center said that the dr would not need a copy, pt wants to talk to Dr nurse to know whether or not copies should be forwarded. CB pt at [phone] or leave         Reason for Disposition  [1] Follow-up call to recent contact AND [2] information only call, no triage required  Answer Assessment - Initial Assessment Questions 1. REASON FOR CALL or QUESTION: "What is your reason for calling today?" or "How can I best help you?" or "What question do you have that I can help answer?"     See message copied and pasted from agent regarding her disability hearing and paperwork.  Protocols used: Information Only Call - No Triage-A-AH

## 2021-07-30 NOTE — Telephone Encounter (Signed)
Returned patient call, NA LVM with provider advise.  

## 2021-07-30 NOTE — Telephone Encounter (Signed)
Please advise, will this information be needed.

## 2021-07-30 NOTE — Telephone Encounter (Signed)
I'm not sure what information patient is speaking about.  If it is copies of her medical records that would be great to have a copy of.

## 2021-07-31 ENCOUNTER — Telehealth: Payer: Self-pay | Admitting: Nurse Practitioner

## 2021-07-31 ENCOUNTER — Ambulatory Visit
Admission: RE | Admit: 2021-07-31 | Discharge: 2021-07-31 | Disposition: A | Discharge status: Home / Self Care | Payer: Medicaid Other | Attending: Nurse Practitioner | Admitting: Nurse Practitioner

## 2021-07-31 ENCOUNTER — Ambulatory Visit
Admission: RE | Admit: 2021-07-31 | Discharge: 2021-07-31 | Disposition: A | Discharge status: Home / Self Care | Payer: Medicaid Other | Source: Ambulatory Visit | Attending: Nurse Practitioner | Admitting: Nurse Practitioner

## 2021-07-31 DIAGNOSIS — R059 Cough, unspecified: Secondary | ICD-10-CM | POA: Diagnosis not present

## 2021-07-31 DIAGNOSIS — R0602 Shortness of breath: Secondary | ICD-10-CM | POA: Diagnosis not present

## 2021-07-31 DIAGNOSIS — Z419 Encounter for procedure for purposes other than remedying health state, unspecified: Secondary | ICD-10-CM | POA: Diagnosis not present

## 2021-07-31 NOTE — Congregational Nurse Program (Signed)
  Dept: [phone]   Congregational Nurse Program Note  Date of Encounter: 07/31/2021 Client to clinic for vital sign check. BP 112/68 (BP Location: Left Arm, Patient Position: Sitting, Cuff Size: Normal)   Pulse 95   SpO2 98%  RN also completed a referral from [facility] [place]. Client has medicaid, but wished to apply for another payer source. Application emailed today by RN. No other concerns at this time. Past Medical History: Past Medical History:  Diagnosis Date   Allergy    Anxiety    Arthritis    Depression    GERD (gastroesophageal reflux disease)    Glaucoma    Heart murmur    Kidney stones    Migraines    Panic attack    Panic attack     Encounter Details:  CNP Questionnaire - 07/31/21 1115       Questionnaire   Do you give verbal consent to treat you today? Yes    Location Patient Served  [facility]    Visit Setting Church or Organization    Patient Status Unknown   client reports she is currenlty living in a camper, near a warehouse. She has access to a bathroom and shower   Insurance Medicaid   well care   Insurance Referral N/A    Medication N/A   no copay for meds with her current insurance   Medical Provider Yes   [facility]   Screening Referrals N/A   again reinforced the importance of a mammogram. [facility] has not rescheduled as of today.   Medical Referral Other    Medical Appointment Made N/A    Food N/A   has food stamps, also uses food pantry at the [employer] N/A   has friends that provide trnasportation, or the [place] bus   Housing/Utilities No permanent housing   currenlty living in a car on a  friends property   Interpersonal Safety N/A    Intervention Support;Blood pressure;Case Management    ED Visit Averted N/A    Life-Saving Intervention Made N/A

## 2021-07-31 NOTE — Telephone Encounter (Signed)
Copied from [place] [id number]. Topic: General - Other >> Jul 31, 2021 12:53 PM Karen Foster wrote: Reason for CRM: Pt reports that she had the chest x-ray done today and she would like to ask pcp to be on the look out for the imaging results.

## 2021-08-03 NOTE — Progress Notes (Signed)
Please let patient know that her chest xray was normal.

## 2021-08-05 ENCOUNTER — Other Ambulatory Visit: Payer: Self-pay | Admitting: *Deleted

## 2021-08-05 NOTE — Patient Outreach (Signed)
Medicaid Managed Care   Nurse Care Manager Note  08/05/2021 Name:  Karen Foster MRN:  [id number] DOB:  1960/07/05  Karen Foster is an 61 y.o. year old female who is a primary patient of Karen Grooms, NP.  The [facility] Managed Care Coordination team was consulted for assistance with:    COPD  Karen Foster was given information about Medicaid Managed Care Coordination team services today. Karen Foster Patient agreed to services and verbal consent obtained.  Engaged with patient by telephone for follow up visit in response to provider referral for case management and/or care coordination services.   Assessments/Interventions:  Review of past medical history, allergies, medications, health status, including review of consultants reports, laboratory and other test data, was performed as part of comprehensive evaluation and provision of chronic care management services.  SDOH (Social Determinants of Health) assessments and interventions performed: SDOH Interventions    Flowsheet Row Most Recent Value  SDOH Interventions   Food Insecurity Interventions Intervention Not Indicated  Transportation Interventions Intervention Not Indicated       Care Plan  Allergies  Allergen Reactions   Bee Venom Anaphylaxis   Ibuprofen Anaphylaxis   Sulfa Antibiotics Anaphylaxis   Tomato Anaphylaxis and Nausea And Vomiting   Hydroxyzine Nausea Only    Medications Reviewed Today     Reviewed by Karen Dach, RN (Registered Nurse) on 08/05/21 at 1502  Med List Status: <None>   Medication Order Taking? Sig Documenting Provider Last Dose Status Informant  albuterol (VENTOLIN HFA) 108 (90 Base) MCG/ACT inhaler [id number] Yes Inhale 2 puffs into the lungs every 6 (six) hours as needed for wheezing or shortness of breath. Karen Grooms, NP Taking Active   Aspirin-Acetaminophen (GOODYS BODY PAIN PO) [id number] Yes Take by mouth. [provider] Taking Active   benzonatate (TESSALON) 100 MG  capsule [id number] Yes TAKE 2 CAPSULES BY MOUTH THREE TIMES DAILY AS NEEDED FOR COUGH Karen Grooms, NP Taking Active   budesonide-formoterol [facility]) 160-4.5 MCG/ACT inhaler [id number] Yes Inhale 2 puffs into the lungs 2 (two) times daily. Karen Grooms, NP Taking Active   cyclobenzaprine (FLEXERIL) 10 MG tablet [id number] Yes Take 1 tablet (10 mg total) by mouth 3 (three) times daily as needed for muscle spasms. Karen Grooms, NP Taking Active   diclofenac sodium (VOLTAREN) 1 % GEL [id number] No Apply 2 g topically 4 (four) times daily as needed.  Patient not taking: Reported on 08/05/2021   Karen Nearing, PA-C Not Taking Active   DULoxetine (CYMBALTA) 20 MG capsule [id number] Yes Take 1 capsule (20 mg total) by mouth daily. Karen Grooms, NP Taking Active   gabapentin (NEURONTIN) 300 MG capsule [id number] Yes Take 1 capsule by mouth 4 times daily Karen Grooms, NP Taking Active   metoprolol tartrate (LOPRESSOR) 25 MG tablet [id number] Yes Take 1 tablet (25 mg total) by mouth 2 (two) times daily. Karen Iba, MD Taking Active   montelukast (SINGULAIR) 10 MG tablet [id number] Yes Take 1 tablet (10 mg total) by mouth at bedtime. Karen Grooms, NP Taking Active   pantoprazole (PROTONIX) 20 MG tablet [id number] Yes Take 1 tablet (20 mg total) by mouth 2 (two) times daily. Karen Grooms, NP Taking Active   predniSONE (DELTASONE) 10 MG tablet [id number] No Take 1 tablet (10 mg total) by mouth daily with breakfast. Directions for 6 day taper: Day 1: 2 tablets before breakfast, 1 after both lunch & dinner and 2 at bedtime Day 2: 1 tab before breakfast, 1 after both  lunch & dinner and 2 at bedtime Day 3: 1 tab at each meal & 1 at bedtime Day 4: 1 tab at breakfast, 1 at lunch, 1 at bedtime Day 5: 1 tab at breakfast & 1 tab at bedtime Day 6: 1 tab at breakfast  Patient not taking: Reported on 08/05/2021   Karen Rigg, NP Not Taking Active            Med Note (Karen Foster  A   Tue Aug 05, 2021  3:00 PM) completed  promethazine-dextromethorphan (PROMETHAZINE-DM) 6.25-15 MG/5ML syrup [id number] Yes Take 5 mLs by mouth 3 (three) times daily as needed for cough. Karen Rigg, NP Taking Active   QUEtiapine (SEROQUEL) 25 MG tablet [id number] Yes Take 1 tablet (25 mg total) by mouth at bedtime. To be taken with 50mg  at bedtime , NP Taking Active   QUEtiapine (SEROQUEL) 50 MG tablet Karen Foster Yes Take 1 tablet (50 mg total) by mouth 2 (two) times daily. To be taken in the morning [id number], NP Taking Active   rosuvastatin (CRESTOR) 10 MG tablet Karen Foster Yes Take 1 tablet (10 mg total) by mouth daily. [id number], NP Taking Active   SPIRIVA RESPIMAT 2.5 MCG/ACT AERS Karen Foster Yes INHALE 2 SPRAY(S) BY MOUTH ONCE DAILY [id number], NP Taking Active             Patient Active Problem List   Diagnosis Date Noted   Other chronic pain 12/25/2020   Allergic rhinitis 05/26/2019   Urticaria 12/01/2018   COPD mixed type (HCC) 12/01/2018   Cigarette smoker 06/08/2018   Arthritis 02/19/2018   Hemorrhoids 02/18/2018   Reactive airway disease 02/18/2018   Migraines 12/12/2017   Deviated septum 12/03/2017   Heart murmur 12/03/2017   Depression 12/03/2017   Panic disorder     Conditions to be addressed/monitored per PCP order:  COPD  Care Plan : RN Care Manager Plan of Care  Updates made by 02/02/2018, RN since 08/05/2021 12:00 AM     Problem: Chronic health managment needs related to COPD      Long-Range Goal: Development of Plan of Care to address health managment needs related to COPD   Start Date: 01/27/2021  Expected End Date: 09/29/2021  Priority: High  Note:   Current Barriers:  Chronic Disease Management support and education needs related to HLD Karen Foster did not attend Lung CT. She has had 2 Urgent Care Visits for Bronchitis due to possible mold in the camper where she was living. She is now staying in a different  camper. She is scheduled for an eye exam 08/07/21 and would like to eventually get new dentures.  RNCM Clinical Goal(s):  Patient will verbalize understanding of plan for management of HLD as evidenced by patient verbalization and self monitoring activity take all medications exactly as prescribed and will call provider for medication related questions as evidenced by documentation in EMR    attend all scheduled medical appointments: 08/07/21 for eye exam and 08/22/21 with PCP as evidenced by provider documentation in EMR        continue to work with RN Care Manager and/or Social Worker to address care management and care coordination needs related to HLD as evidenced by adherence to CM Team Scheduled appointments     through collaboration with 08/24/21, provider, and care team.   Interventions: Inter-disciplinary care team collaboration (see longitudinal plan of care) Evaluation of current treatment plan related to  self management and patient's adherence  to plan as established by provider Discussed the importance in attending scheduled appointments and rescheduling Lung CT Provided therapeutic listening Provided patient with Ur[facility] [phone], advised to call for dental evaluation   COPD Interventions:  (Status:  Goal on track:  NO.) Long Term Goal Provided education about and advised patient to utilize infection prevention strategies to reduce risk of respiratory infection Assessed social determinant of health barriers Advised patient to reschedule and attend Lung CT  Hyperlipidemia:  (Status: Condition stable. Not addressed this visit.) Long Term Goal  Lab Results  Component Value Date   CHOL 137 02/14/2021   HDL 22 (L) 02/14/2021   LDLCALC 46 02/14/2021   TRIG 470 (H) 02/14/2021   CHOLHDL 6.2 (H) 02/14/2021     Medication review performed; medication list updated in electronic medical record.  Counseled on importance of regular laboratory monitoring as  prescribed; Provided HLD educational materials; Reviewed exercise goals and target of 150 minutes per week; Assessed social determinant of health barriers;  Encouraged patient to discuss any concerns or questions with provider Encouraged smoking cessation-patient does not desire to quit at this time  Patient Goals/Self-Care Activities: Take medications as prescribed   Attend all scheduled provider appointments Call pharmacy for medication refills 3-7 days in advance of running out of medications Attend church or other social activities Perform all self care activities independently  Call provider office for new concerns or questions  - adhere to prescribed diet: heart healthy - develop an exercise routine       Follow Up:  Patient agrees to Care Plan and Follow-up.  Plan: The Managed Medicaid care management team will reach out to the patient again over the next 30 days.  Date/time of next scheduled RN care management/care coordination outreach:  09/10/21 @ 1pm  Estanislado EmmsMelanie Corayma Cashatt RN, BSN [place]  [facility] RN Care Coordinator

## 2021-08-05 NOTE — Congregational Nurse Program (Signed)
  Dept: [phone]   Congregational Nurse Program Note  Date of Encounter: 08/05/2021 Client to clinic for vital sign check and support, also discussed her disability physical she had on 6/2. BP 116/76 (BP Location: Left Arm, Patient Position: Sitting, Cuff Size: Normal)   Pulse 69   SpO2 98%  Strongly encouraged client to reschedule her mammogram, which was due several months ago.  No new concerns at this time. Past Medical History: Past Medical History:  Diagnosis Date   Allergy    Anxiety    Arthritis    Depression    GERD (gastroesophageal reflux disease)    Glaucoma    Heart murmur    Kidney stones    Migraines    Panic attack    Panic attack     Encounter Details:  CNP Questionnaire - 08/05/21 1130       Questionnaire   Do you give verbal consent to treat you today? Yes    Location Patient Served  [facility]    Visit Setting Church or Organization    Patient Status Unknown   client reports she is currenlty living in a camper, near a warehouse. She has access to a bathroom and shower   Insurance Medicaid   well care   Insurance Referral N/A    Medication N/A   no copay for meds with her current insurance   Medical Provider Yes   [facility]   Screening Referrals N/A   again reinforced the importance of a mammogram. Cleint has not rescheduled as of today.   Medical Referral Other    Medical Appointment Made N/A    Food N/A   has food stamps, also uses food pantry at the [employer] N/A   has friends that provide trnasportation, or the [place] bus   Housing/Utilities No permanent housing   currenlty living in a car on a  friends property   Interpersonal Safety N/A    Intervention Support;Blood pressure;Case Management    ED Visit Averted N/A    Life-Saving Intervention Made N/A

## 2021-08-05 NOTE — Patient Instructions (Signed)
Visit Information  Karen Foster was given information about Medicaid Managed Care team care coordination services as a part of their [facility] Medicaid benefit. Karen Foster verbally consented to engagement with the [facility] Managed Care team.   If you are experiencing a medical emergency, please call 911 or report to your local emergency department or urgent care.   If you have a non-emergency medical problem during routine business hours, please contact your provider's office and ask to speak with a nurse.   For questions related to your [facility] health plan, please call: [phone] or go here:[email or web address]  If you would like to schedule transportation through your [facility] plan, please call the following number at least 2 days in advance of your appointment: [phone].  You can also use the MTM portal or MTM mobile app to manage your rides. For the portal, please go to [email or web address]  Call the [facility] Crisis Line at [phone], at any time, 24 hours a day, 7 days a week. If you are in danger or need immediate medical attention call 911.  If you would like help to quit smoking, call 1-800-QUIT-NOW ((502)455-1306) OR Espaol: 1-855-Djelo-Ya (5-364-680-3212) o para ms informacin haga clic aqu or Text READY to 248-250 to register via text  Karen Foster,   Please see education materials related to smoking provided by MyChart link.  Patient verbalizes understanding of instructions and care plan provided today and agrees to view in MyChart. Active MyChart status and patient understanding of how to access instructions and care plan via MyChart confirmed with patient.     Telephone follow up appointment with Managed Medicaid care management team member scheduled for:09/10/21 @ 1pm  Estanislado Emms RN, BSN [place]  [facility] RN Care Coordinator   Following is a copy of your plan of care:  Care Plan : RN Care Manager Plan of  Care  Updates made by Heidi Dach, RN since 08/05/2021 12:00 AM     Problem: Chronic health managment needs related to COPD      Long-Range Goal: Development of Plan of Care to address health managment needs related to COPD   Start Date: 01/27/2021  Expected End Date: 09/29/2021  Priority: High  Note:   Current Barriers:  Chronic Disease Management support and education needs related to HLD Karen Foster did not attend Lung CT. She has had 2 Urgent Care Visits for Bronchitis due to possible mold in the camper where she was living. She is now staying in a different camper. She is scheduled for an eye exam 08/07/21 and would like to eventually get new dentures.  RNCM Clinical Goal(s):  Patient will verbalize understanding of plan for management of HLD as evidenced by patient verbalization and self monitoring activity take all medications exactly as prescribed and will call provider for medication related questions as evidenced by documentation in EMR    attend all scheduled medical appointments: 08/07/21 for eye exam and 08/22/21 with PCP as evidenced by provider documentation in EMR        continue to work with RN Care Manager and/or Social Worker to address care management and care coordination needs related to HLD as evidenced by adherence to CM Team Scheduled appointments     through collaboration with Medical illustrator, provider, and care team.   Interventions: Inter-disciplinary care team collaboration (see longitudinal plan of care) Evaluation of current treatment plan related to  self management and patient's adherence to plan as established by provider Discussed the importance  in attending scheduled appointments and rescheduling Lung CT Provided therapeutic listening Provided patient with Urgent Tooth [phone], advised to call for dental evaluation   COPD Interventions:  (Status:  Goal on track:  NO.) Long Term Goal Provided education about and advised patient to utilize infection  prevention strategies to reduce risk of respiratory infection Assessed social determinant of health barriers Advised patient to reschedule and attend Lung CT  Hyperlipidemia:  (Status: Condition stable. Not addressed this visit.) Long Term Goal  Lab Results  Component Value Date   CHOL 137 02/14/2021   HDL 22 (L) 02/14/2021   LDLCALC 46 02/14/2021   TRIG 470 (H) 02/14/2021   CHOLHDL 6.2 (H) 02/14/2021     Medication review performed; medication list updated in electronic medical record.  Counseled on importance of regular laboratory monitoring as prescribed; Provided HLD educational materials; Reviewed exercise goals and target of 150 minutes per week; Assessed social determinant of health barriers;  Encouraged patient to discuss any concerns or questions with provider Encouraged smoking cessation-patient does not desire to quit at this time  Patient Goals/Self-Care Activities: Take medications as prescribed   Attend all scheduled provider appointments Call pharmacy for medication refills 3-7 days in advance of running out of medications Attend church or other social activities Perform all self care activities independently  Call provider office for new concerns or questions  - adhere to prescribed diet: heart healthy - develop an exercise routine

## 2021-08-10 ENCOUNTER — Other Ambulatory Visit: Payer: Self-pay | Admitting: Cardiovascular Disease

## 2021-08-18 ENCOUNTER — Other Ambulatory Visit: Payer: Self-pay | Admitting: Cardiovascular Disease

## 2021-08-22 ENCOUNTER — Ambulatory Visit: Payer: Medicaid Other | Admitting: Physician Assistant

## 2021-08-22 NOTE — Progress Notes (Deleted)
Established Patient Office Visit  Name: Karen Foster   MRN: [id number]    DOB: 02/01/1961   Date:08/22/2021  Today's Provider: Talitha Givens, MHS, PA-C Introduced myself to the patient as a PA-C and provided education on APPs in clinical practice.         Subjective  Chief Complaint  No chief complaint on file.   HPI   DEPRESSION Mood status: {Blank single:19197::"controlled","uncontrolled","better","worse","exacerbated","stable"} Satisfied with current treatment?: {Blank single:19197::"yes","no"} Symptom severity: {Blank single:19197::"mild","moderate","severe"}  Duration of current treatment : {Blank single:19197::"chronic","months","years"} Side effects: {Blank single:19197::"yes","no"} Medication compliance: {Blank single:19197::"excellent compliance","good compliance","fair compliance","poor compliance"} Psychotherapy/counseling: {Blank single:19197::"yes","no"} {Blank single:19197::"current","in the past"} Previous psychiatric medications: cymbalta and seroquel Depressed mood: {Blank single:19197::"yes","no"} Anxious mood: {Blank single:19197::"yes","no"} Anhedonia: {Blank single:19197::"yes","no"} Significant weight loss or gain: {Blank single:19197::"yes","no"} Insomnia: {Blank single:19197::"yes","no"} {Blank single:19197::"hard to fall asleep","hard to stay asleep"} Fatigue: {Blank single:19197::"yes","no"} Feelings of worthlessness or guilt: {Blank single:19197::"yes","no"} Impaired concentration/indecisiveness: {Blank single:19197::"yes","no"} Suicidal ideations: {Blank single:19197::"yes","no"} Hopelessness: {Blank single:19197::"yes","no"} Crying spells: {Blank single:19197::"yes","no"}    05/22/2021    1:08 PM 04/11/2021   10:53 AM 02/28/2021   10:55 AM 02/14/2021    2:15 PM 12/01/2018    5:04 PM  Depression screen PHQ 2/9  Decreased Interest 0 0 0 0 0  Down, Depressed, Hopeless 0 0 0 0 0  PHQ - 2 Score 0 0 0 0 0  Altered sleeping 1 2 1 2 1    Tired, decreased energy 1 1 1 1  0  Change in appetite 0 0 0 1 1  Feeling bad or failure about yourself  0 0 0 0 0  Trouble concentrating 0 0 0 0 0  Moving slowly or fidgety/restless 0 0 0 0 0  Suicidal thoughts 0 0 0 0 0  PHQ-9 Score 2 3 2 4 2   Difficult doing work/chores Not difficult at all Not difficult at all Not difficult at all Not difficult at all     ANXIETY/STRESS Duration:{Blank single:19197::"controlled","uncontrolled","better","worse","exacerbated","stable"} Anxious mood: {Blank single:19197::"yes","no"}  Excessive worrying: {Blank single:19197::"yes","no"} Irritability: {Blank single:19197::"yes","no"}  Sweating: {Blank single:19197::"yes","no"} Nausea: {Blank single:19197::"yes","no"} Palpitations:{Blank single:19197::"yes","no"} Hyperventilation: {Blank single:19197::"yes","no"} Panic attacks: {Blank single:19197::"yes","no"} Agoraphobia: {Blank single:19197::"yes","no"}  Obscessions/compulsions: {Blank single:19197::"yes","no"} Depressed mood: {Blank single:19197::"yes","no"}    05/22/2021    1:08 PM 04/11/2021   10:53 AM 02/28/2021   10:55 AM 02/14/2021    2:15 PM 12/01/2018    5:04 PM  Depression screen PHQ 2/9  Decreased Interest 0 0 0 0 0  Down, Depressed, Hopeless 0 0 0 0 0  PHQ - 2 Score 0 0 0 0 0  Altered sleeping 1 2 1 2 1   Tired, decreased energy 1 1 1 1  0  Change in appetite 0 0 0 1 1  Feeling bad or failure about yourself  0 0 0 0 0  Trouble concentrating 0 0 0 0 0  Moving slowly or fidgety/restless 0 0 0 0 0  Suicidal thoughts 0 0 0 0 0  PHQ-9 Score 2 3 2 4 2   Difficult doing work/chores Not difficult at all Not difficult at all Not difficult at all Not difficult at all    Anhedonia: {Blank single:19197::"yes","no"} Weight changes: {Blank single:19197::"yes","no"} Insomnia: {Blank single:19197::"yes","no"} {Blank single:19197::"hard to fall asleep","hard to stay asleep"}  Hypersomnia: {Blank single:19197::"yes","no"} Fatigue/loss of energy:  {Blank single:19197::"yes","no"} Feelings of worthlessness: {Blank single:19197::"yes","no"} Feelings of guilt: {Blank single:19197::"yes","no"} Impaired concentration/indecisiveness: {Blank single:19197::"yes","no"} Suicidal ideations: {Blank single:19197::"yes","no"}  Crying spells: {Blank single:19197::"yes","no"} Recent Stressors/Life Changes: {Blank single:19197::"yes","no"}   Relationship problems: {Blank  single:19197::"yes","no"}   Family stress: {Blank single:19197::"yes","no"}     Financial stress: {Blank single:19197::"yes","no"}    Job stress: {Blank single:19197::"yes","no"}    Recent death/loss: {Blank single:19197::"yes","no"}   COPD COPD status: {Blank single:19197::"controlled","uncontrolled","better","worse", "exacerbated", "stable"} Satisfied with current treatment?: {Blank single:19197::"yes","no"} Oxygen use: {Blank single:19197::"yes","no"} Dyspnea frequency:  Cough frequency:  Rescue inhaler frequency:   Limitation of activity: {Blank single:19197::"yes","no"} Productive cough:  Last Spirometry:  Pneumovax: {Blank single:19197::"Up to Date","Not up to Date", "unknown"} Influenza: {Blank single:19197::"Up to Date","Not up to Date", "unknown"}  Patient Active Problem List   Diagnosis Date Noted   Other chronic pain 12/25/2020   Allergic rhinitis 05/26/2019   Urticaria 12/01/2018   COPD mixed type ([place]) 12/01/2018   Cigarette smoker 06/08/2018   Arthritis 02/19/2018   Hemorrhoids 02/18/2018   Reactive airway disease 02/18/2018   Migraines 12/12/2017   Deviated septum 12/03/2017   Heart murmur 12/03/2017   Depression 12/03/2017   Panic disorder     Past Surgical History:  Procedure Laterality Date   FRACTURE SURGERY     JOINT REPLACEMENT  2011   Hip   REVISION TOTAL HIP ARTHROPLASTY  2011   SEPTOPLASTY  2007   TONSILLECTOMY     TOTAL ABDOMINAL HYSTERECTOMY W/ BILATERAL SALPINGOOPHORECTOMY      Family History  Problem Relation Age of Onset    Kidney failure Mother    Heart attack Mother    Asthma Mother    Kidney disease Mother    Diabetes Mother    Cancer Father    Heart disease Maternal Aunt    Hypertension Maternal Aunt    Cancer Maternal Uncle    Asthma Maternal Uncle    Heart attack Paternal Aunt    Heart attack Paternal Uncle    Diabetes Paternal Uncle    Kidney failure Maternal Grandmother    Multiple myeloma Maternal Grandmother    Other Maternal Grandmother        multiple myeloma   Heart attack Maternal Grandfather    Hyperlipidemia Maternal Grandfather    Diabetes Maternal Grandfather    Prostate cancer Neg Hx    Bladder Cancer Neg Hx     Social History   Tobacco Use   Smoking status: Every Day    Packs/day: 0.50    Years: 40.00    Total pack years: 20.00    Types: Cigarettes   Smokeless tobacco: Never  Substance Use Topics   Alcohol use: No     Current Outpatient Medications:    albuterol (VENTOLIN HFA) 108 (90 Base) MCG/ACT inhaler, Inhale 2 puffs into the lungs every 6 (six) hours as needed for wheezing or shortness of breath., Disp: 1 each, Rfl: 3   Aspirin-Acetaminophen (GOODYS BODY PAIN PO), Take by mouth., Disp: , Rfl:    benzonatate (TESSALON) 100 MG capsule, TAKE 2 CAPSULES BY MOUTH THREE TIMES DAILY AS NEEDED FOR COUGH, Disp: 90 capsule, Rfl: 1   budesonide-formoterol (SYMBICORT) 160-4.5 MCG/ACT inhaler, Inhale 2 puffs into the lungs 2 (two) times daily., Disp: 1 each, Rfl: 3   cyclobenzaprine (FLEXERIL) 10 MG tablet, Take 1 tablet (10 mg total) by mouth 3 (three) times daily as needed for muscle spasms., Disp: 90 tablet, Rfl: 0   diclofenac sodium (VOLTAREN) 1 % GEL, Apply 2 g topically 4 (four) times daily as needed. (Patient not taking: Reported on 08/05/2021), Disp: 100 g, Rfl: 3   DULoxetine (CYMBALTA) 20 MG capsule, Take 1 capsule (20 mg total) by mouth daily., Disp: 90 capsule, Rfl: 1   gabapentin (  NEURONTIN) 300 MG capsule, Take 1 capsule by mouth 4 times daily, Disp: 120 capsule,  Rfl: 1   metoprolol tartrate (LOPRESSOR) 25 MG tablet, Take 1 tablet (25 mg total) by mouth 2 (two) times daily., Disp: 180 tablet, Rfl: 3   montelukast (SINGULAIR) 10 MG tablet, Take 1 tablet (10 mg total) by mouth at bedtime., Disp: 90 tablet, Rfl: 1   pantoprazole (PROTONIX) 20 MG tablet, Take 1 tablet (20 mg total) by mouth 2 (two) times daily., Disp: 180 tablet, Rfl: 1   predniSONE (DELTASONE) 10 MG tablet, Take 1 tablet (10 mg total) by mouth daily with breakfast. Directions for 6 day taper: Day 1: 2 tablets before breakfast, 1 after both lunch & dinner and 2 at bedtime Day 2: 1 tab before breakfast, 1 after both lunch & dinner and 2 at bedtime Day 3: 1 tab at each meal & 1 at bedtime Day 4: 1 tab at breakfast, 1 at lunch, 1 at bedtime Day 5: 1 tab at breakfast & 1 tab at bedtime Day 6: 1 tab at breakfast (Patient not taking: Reported on 08/05/2021), Disp: 21 tablet, Rfl: 0   promethazine-dextromethorphan (PROMETHAZINE-DM) 6.25-15 MG/5ML syrup, Take 5 mLs by mouth 3 (three) times daily as needed for cough., Disp: 240 mL, Rfl: 0   QUEtiapine (SEROQUEL) 25 MG tablet, Take 1 tablet (25 mg total) by mouth at bedtime. To be taken with 32m at bedtime, Disp: 90 tablet, Rfl: 1   QUEtiapine (SEROQUEL) 50 MG tablet, Take 1 tablet (50 mg total) by mouth 2 (two) times daily. To be taken in the morning, Disp: 180 tablet, Rfl: 1   rosuvastatin (CRESTOR) 10 MG tablet, Take 1 tablet (10 mg total) by mouth daily., Disp: 90 tablet, Rfl: 1   SPIRIVA RESPIMAT 2.5 MCG/ACT AERS, INHALE 2 SPRAY(S) BY MOUTH ONCE DAILY, Disp: 4 g, Rfl: 0  Allergies  Allergen Reactions   Bee Venom Anaphylaxis   Ibuprofen Anaphylaxis   Sulfa Antibiotics Anaphylaxis   Tomato Anaphylaxis and Nausea And Vomiting   Hydroxyzine Nausea Only    I personally reviewed {Reviewed:14835} with the patient/caregiver today.   ROS    Objective  There were no vitals filed for this visit.  There is no height or weight on file to calculate  BMI.  Physical Exam   Recent Results (from the past 2160 hour(s))  ECHOCARDIOGRAM COMPLETE     Status: None   Collection Time: 06/30/21  2:23 PM  Result Value Ref Range   AR max vel 2.20 cm2   AV Peak grad 3.6 mmHg   Ao pk vel 0.95 m/s   S' Lateral 2.60 cm   Area-P 1/2 4.16 cm2   AV Area VTI 2.26 cm2   AV Mean grad 2.0 mmHg   Single Plane A4C EF 59.4 %   Single Plane A2C EF 57.9 %   Calc EF 58.3 %   AV Area mean vel 2.13 cm2     PHQ2/9:    05/22/2021    1:08 PM 04/11/2021   10:53 AM 02/28/2021   10:55 AM 02/14/2021    2:15 PM 12/01/2018    5:04 PM  Depression screen PHQ 2/9  Decreased Interest 0 0 0 0 0  Down, Depressed, Hopeless 0 0 0 0 0  PHQ - 2 Score 0 0 0 0 0  Altered sleeping 1 2 1 2 1   Tired, decreased energy 1 1 1 1  0  Change in appetite 0 0 0 1 1  Feeling bad or  failure about yourself  0 0 0 0 0  Trouble concentrating 0 0 0 0 0  Moving slowly or fidgety/restless 0 0 0 0 0  Suicidal thoughts 0 0 0 0 0  PHQ-9 Score 2 3 2 4 2   Difficult doing work/chores Not difficult at all Not difficult at all Not difficult at all Not difficult at all       Fall Risk:    05/22/2021    1:08 PM 04/11/2021   10:53 AM 02/28/2021   10:54 AM 02/14/2021    2:14 PM 12/01/2018    5:03 PM  Fall Risk   Falls in the past year? 0 0 0 0 1  Number falls in past yr: 0 0 0 0 0  Injury with Fall? 0 0 0 0 1  Risk for fall due to : No Fall Risks No Fall Risks No Fall Risks No Fall Risks   Follow up Falls evaluation completed Falls evaluation completed Falls evaluation completed Falls evaluation completed       Functional Status Survey:      Assessment & Plan

## 2021-08-24 ENCOUNTER — Other Ambulatory Visit: Payer: Self-pay | Admitting: Nurse Practitioner

## 2021-08-30 DIAGNOSIS — Z419 Encounter for procedure for purposes other than remedying health state, unspecified: Secondary | ICD-10-CM | POA: Diagnosis not present

## 2021-09-10 ENCOUNTER — Other Ambulatory Visit: Payer: Self-pay | Admitting: *Deleted

## 2021-09-10 NOTE — Patient Outreach (Signed)
Medicaid Managed Care   Nurse Care Manager Note  09/10/2021 Name:  OBELIA BONELLO MRN:  [id number] DOB:  11-23-1960  Karen Foster is an 61 y.o. year old female who is a primary patient of Larae Grooms, NP.  The [facility] Managed Care Coordination team was consulted for assistance with:    HLD COPD  Ms. Kronberg was given information about Medicaid Managed Care Coordination team services today. Karen Foster Patient agreed to services and verbal consent obtained.  Engaged with patient by telephone for follow up visit in response to provider referral for case management and/or care coordination services.   Assessments/Interventions:  Review of past medical history, allergies, medications, health status, including review of consultants reports, laboratory and other test data, was performed as part of comprehensive evaluation and provision of chronic care management services.  SDOH (Social Determinants of Health) assessments and interventions performed:   Care Plan  Allergies  Allergen Reactions   Bee Venom Anaphylaxis   Ibuprofen Anaphylaxis   Sulfa Antibiotics Anaphylaxis   Tomato Anaphylaxis and Nausea And Vomiting   Hydroxyzine Nausea Only    Medications Reviewed Today     Reviewed by Heidi Dach, RN (Registered Nurse) on 09/10/21 at 1314  Med List Status: <None>   Medication Order Taking? Sig Documenting Provider Last Dose Status Informant  Aspirin-Acetaminophen (GOODYS BODY PAIN PO) [id number] Yes Take by mouth. [provider] Taking Active   benzonatate (TESSALON) 100 MG capsule [id number] Yes TAKE 2 CAPSULES BY MOUTH THREE TIMES DAILY AS NEEDED FOR COUGH Larae Grooms, NP Taking Active   budesonide-formoterol [facility]) 160-4.5 MCG/ACT inhaler [id number] Yes Inhale 2 puffs into the lungs 2 (two) times daily. Larae Grooms, NP Taking Active   cyclobenzaprine (FLEXERIL) 10 MG tablet [id number] Yes Take 1 tablet (10 mg total) by mouth 3 (three) times daily as  needed for muscle spasms. Larae Grooms, NP Taking Active   diclofenac sodium (VOLTAREN) 1 % GEL [id number] No Apply 2 g topically 4 (four) times daily as needed.  Patient not taking: Reported on 08/05/2021   Particia Nearing, PA-C Not Taking Active   DULoxetine (CYMBALTA) 20 MG capsule [id number] No Take 1 capsule (20 mg total) by mouth daily.  Patient not taking: Reported on 09/10/2021   Larae Grooms, NP Not Taking Active   gabapentin (NEURONTIN) 300 MG capsule [id number] Yes Take 1 capsule by mouth 4 times daily Larae Grooms, NP Taking Active   metoprolol tartrate (LOPRESSOR) 25 MG tablet [id number] Yes Take 1 tablet (25 mg total) by mouth 2 (two) times daily. Antonieta Iba, MD Taking Active            Med Note (Shatoria Stooksbury A   Wed Sep 10, 2021  1:12 PM) Taking 25mg  in the morning and 50mg  at night  montelukast (SINGULAIR) 10 MG tablet Yes Take 1 tablet (10 mg total) by mouth at bedtime. , NP Taking Active   pantoprazole (PROTONIX) 20 MG tablet [id number] Yes Take 1 tablet (20 mg total) by mouth 2 (two) times daily. Larae Grooms, NP Taking Active   predniSONE (DELTASONE) 10 MG tablet [id number] No Take 1 tablet (10 mg total) by mouth daily with breakfast. Directions for 6 day taper: Day 1: 2 tablets before breakfast, 1 after both lunch & dinner and 2 at bedtime Day 2: 1 tab before breakfast, 1 after both lunch & dinner and 2 at bedtime Day 3: 1 tab at each meal & 1 at bedtime Day 4: 1 tab  at breakfast, 1 at lunch, 1 at bedtime Day 5: 1 tab at breakfast & 1 tab at bedtime Day 6: 1 tab at breakfast  Patient not taking: Reported on 08/05/2021   Claiborne Rigg, NP Not Taking Active            Med Note (Daxten Kovalenko A   Tue Aug 05, 2021  3:00 PM) completed  promethazine-dextromethorphan (PROMETHAZINE-DM) 6.25-15 MG/5ML syrup [id number] Yes Take 5 mLs by mouth 3 (three) times daily as needed for cough. Claiborne Rigg, NP Taking Active   QUEtiapine  (SEROQUEL) 25 MG tablet [id number] Yes Take 1 tablet (25 mg total) by mouth at bedtime. To be taken with 50mg  at bedtime , NP Taking Active   QUEtiapine (SEROQUEL) 50 MG tablet Larae Grooms Yes Take 1 tablet (50 mg total) by mouth 2 (two) times daily. To be taken in the morning [id number], NP Taking Active   rosuvastatin (CRESTOR) 10 MG tablet Larae Grooms Yes Take 1 tablet (10 mg total) by mouth daily. [id number], NP Taking Active   SPIRIVA RESPIMAT 2.5 MCG/ACT AERS Larae Grooms Yes INHALE 2 SPRAY(S) BY MOUTH ONCE DAILY [id number], NP Taking Active   VENTOLIN HFA 108 (90 Base) MCG/ACT inhaler Larae Grooms Yes INHALE 2 PUFFS BY MOUTH EVERY 6 HOURS AS NEEDED FOR WHEEZING OR SHORTNESS OF [id number], NP Taking Active             Patient Active Problem List   Diagnosis Date Noted   Other chronic pain 12/25/2020   Allergic rhinitis 05/26/2019   Urticaria 12/01/2018   COPD mixed type (HCC) 12/01/2018   Cigarette smoker 06/08/2018   Arthritis 02/19/2018   Hemorrhoids 02/18/2018   Reactive airway disease 02/18/2018   Migraines 12/12/2017   Deviated septum 12/03/2017   Heart murmur 12/03/2017   Depression 12/03/2017   Panic disorder     Conditions to be addressed/monitored per PCP order:  HLD and COPD  Care Plan : RN Care Manager Plan of Care  Updates made by 02/02/2018, RN since 09/10/2021 12:00 AM     Problem: Chronic health managment needs related to COPD      Long-Range Goal: Development of Plan of Care to address health managment needs related to COPD   Start Date: 01/27/2021  Expected End Date: 10/30/2021  Priority: High  Note:   Current Barriers:  Chronic Disease Management support and education needs related to HLD Ms. Westrup has not had a chance to attend Lung CT and needs to reschedule missed PCP visit. She is spending time helping a friend in another county. She reports taking her medications and feeling better. She will follow  up on requesting Pulmonology referral when she returns. She denies any needs at this time.  RNCM Clinical Goal(s):  Patient will verbalize understanding of plan for management of HLD as evidenced by patient verbalization and self monitoring activity take all medications exactly as prescribed and will call provider for medication related questions as evidenced by documentation in EMR    attend all scheduled medical appointments: reschedule with PCP as evidenced by provider documentation in EMR        continue to work with RN Care Manager and/or Social Worker to address care management and care coordination needs related to HLD as evidenced by adherence to CM Team Scheduled appointments     through collaboration with RN Care manager, provider, and care team.   Interventions: Inter-disciplinary care team collaboration (see longitudinal plan of care) Evaluation of current  treatment plan related to  self management and patient's adherence to plan as established by provider Discussed the importance in attending rescheduling PCP appointment and rescheduling Lung CT Advised patient to schedule virtual visit with PCP while she is out of the [place] Provided therapeutic listening   COPD Interventions:  (Status:  Goal on track:  NO.) Long Term Goal Provided education about and advised patient to utilize infection prevention strategies to reduce risk of respiratory infection Assessed social determinant of health barriers Advised patient to reschedule and attend Lung CT Encouraged patient to discuss needing a pulmonology referral with her PCP  Hyperlipidemia:  (Status: Condition stable. Not addressed this visit.) Long Term Goal  Lab Results  Component Value Date   CHOL 137 02/14/2021   HDL 22 (L) 02/14/2021   LDLCALC 46 02/14/2021   TRIG 470 (H) 02/14/2021   CHOLHDL 6.2 (H) 02/14/2021     Medication review performed; medication list updated in electronic medical record.  Counseled on importance of  regular laboratory monitoring as prescribed; Provided HLD educational materials; Reviewed exercise goals and target of 150 minutes per week; Assessed social determinant of health barriers;  Encouraged patient to discuss any concerns or questions with provider Encouraged smoking cessation-patient does not desire to quit at this time  Patient Goals/Self-Care Activities: Take medications as prescribed   Attend all scheduled provider appointments Call pharmacy for medication refills 3-7 days in advance of running out of medications Attend church or other social activities Perform all self care activities independently  Call provider office for new concerns or questions  - adhere to prescribed diet: heart healthy - develop an exercise routine       Follow Up:  Patient agrees to Care Plan and Follow-up.  Plan: The Managed Medicaid care management team will reach out to the patient again over the next 60 days.  Date/time of next scheduled RN care management/care coordination outreach:  11/10/21 @ 2:30pm  Lurena Joiner RN, BSN [place] RN Care Coordinator

## 2021-09-10 NOTE — Patient Instructions (Signed)
Visit Information  Karen Foster was given information about Medicaid Managed Care team care coordination services as a part of their [facility] Medicaid benefit. Karen Foster verbally consented to engagement with the [facility] Managed Care team.   If you are experiencing a medical emergency, please call 911 or report to your local emergency department or urgent care.   If you have a non-emergency medical problem during routine business hours, please contact your provider's office and ask to speak with a nurse.   For questions related to your [facility] health plan, please call: [phone] or go here:[email or web address]  If you would like to schedule transportation through your [facility] plan, please call the following number at least 2 days in advance of your appointment: [phone].  You can also use the MTM portal or MTM mobile app to manage your rides. For the portal, please go to [email or web address]  Call the [facility] Crisis Line at [phone], at any time, 24 hours a day, 7 days a week. If you are in danger or need immediate medical attention call 911.  If you would like help to quit smoking, call 1-800-QUIT-NOW (812-367-1675) OR Espaol: 1-855-Djelo-Ya (7-902-409-7353) o para ms informacin haga clic aqu or Text READY to 299-242 to register via text  Karen Foster,   Please see education materials related to smoking and heart healthy diet provided by MyChart link.  Patient verbalizes understanding of instructions and care plan provided today and agrees to view in MyChart. Active MyChart status and patient understanding of how to access instructions and care plan via MyChart confirmed with patient.     Telephone follow up appointment with Managed Medicaid care management team member scheduled for:11/10/21 @ 2:30pm  Estanislado Emms RN, BSN [place]  [facility] RN Care Coordinator   Following is a copy of your plan of care:  Care Plan :  RN Care Manager Plan of Care  Updates made by Heidi Dach, RN since 09/10/2021 12:00 AM     Problem: Chronic health managment needs related to COPD      Long-Range Goal: Development of Plan of Care to address health managment needs related to COPD   Start Date: 01/27/2021  Expected End Date: 10/30/2021  Priority: High  Note:   Current Barriers:  Chronic Disease Management support and education needs related to HLD Karen Foster has not had a chance to attend Lung CT and needs to reschedule missed PCP visit. She is spending time helping a friend in another county. She reports taking her medications and feeling better. She will follow up on requesting Pulmonology referral when she returns. She denies any needs at this time.  RNCM Clinical Goal(s):  Patient will verbalize understanding of plan for management of HLD as evidenced by patient verbalization and self monitoring activity take all medications exactly as prescribed and will call provider for medication related questions as evidenced by documentation in EMR    attend all scheduled medical appointments: reschedule with PCP as evidenced by provider documentation in EMR        continue to work with RN Care Manager and/or Social Worker to address care management and care coordination needs related to HLD as evidenced by adherence to CM Team Scheduled appointments     through collaboration with RN Care manager, provider, and care team.   Interventions: Inter-disciplinary care team collaboration (see longitudinal plan of care) Evaluation of current treatment plan related to  self management and patient's adherence to plan as established by provider Discussed  the importance in attending rescheduling PCP appointment and rescheduling Lung CT Advised patient to schedule virtual visit with PCP while she is out of the county Provided therapeutic listening   COPD Interventions:  (Status:  Goal on track:  NO.) Long Term Goal Provided education  about and advised patient to utilize infection prevention strategies to reduce risk of respiratory infection Assessed social determinant of health barriers Advised patient to reschedule and attend Lung CT Encouraged patient to discuss needing a pulmonology referral with her PCP  Hyperlipidemia:  (Status: Condition stable. Not addressed this visit.) Long Term Goal  Lab Results  Component Value Date   CHOL 137 02/14/2021   HDL 22 (L) 02/14/2021   LDLCALC 46 02/14/2021   TRIG 470 (H) 02/14/2021   CHOLHDL 6.2 (H) 02/14/2021     Medication review performed; medication list updated in electronic medical record.  Counseled on importance of regular laboratory monitoring as prescribed; Provided HLD educational materials; Reviewed exercise goals and target of 150 minutes per week; Assessed social determinant of health barriers;  Encouraged patient to discuss any concerns or questions with provider Encouraged smoking cessation-patient does not desire to quit at this time  Patient Goals/Self-Care Activities: Take medications as prescribed   Attend all scheduled provider appointments Call pharmacy for medication refills 3-7 days in advance of running out of medications Attend church or other social activities Perform all self care activities independently  Call provider office for new concerns or questions  - adhere to prescribed diet: heart healthy - develop an exercise routine

## 2021-09-12 ENCOUNTER — Other Ambulatory Visit: Payer: Self-pay | Admitting: Nurse Practitioner

## 2021-09-12 NOTE — Telephone Encounter (Signed)
Requested Prescriptions  Pending Prescriptions Disp Refills  . gabapentin (NEURONTIN) 300 MG capsule [Pharmacy Med Name: Gabapentin 300 MG Oral Capsule] 120 capsule 0    Sig: Take 1 capsule by mouth 4 times daily     Neurology: Anticonvulsants - gabapentin Failed - 09/12/2021 11:07 AM      Failed - Cr in normal range and within 360 days    Creatinine, Ser  Date Value Ref Range Status  02/14/2021 0.55 (L) 0.57 - 1.00 mg/dL Final         Passed - Completed PHQ-2 or PHQ-9 in the last 360 days      Passed - Valid encounter within last 12 months    Recent Outpatient Visits          3 months ago Severe episode of recurrent major depressive disorder, without psychotic features (HCC)   [facility] Larae Grooms, NP   3 months ago COPD mixed type [facility])   [facility] Larae Grooms, NP   5 months ago COPD mixed type [facility])   [facility] Larae Grooms, NP   6 months ago Thrush   [facility] Larae Grooms, NP   7 months ago Annual physical exam   [facility] Larae Grooms, NP

## 2021-09-23 ENCOUNTER — Other Ambulatory Visit: Payer: Self-pay | Admitting: Nurse Practitioner

## 2021-09-24 NOTE — Telephone Encounter (Signed)
Requested Prescriptions  Pending Prescriptions Disp Refills  . VENTOLIN HFA 108 (90 Base) MCG/ACT inhaler [Pharmacy Med Name: Ventolin HFA 108 (90 Base) MCG/ACT Inhalation Aerosol Solution] 18 g 0    Sig: INHALE 2 PUFFS BY MOUTH EVERY 6 HOURS AS NEEDED FOR WHEEZING FOR SHORTNESS OF BREATH     Pulmonology:  [facility] 2 Passed - 09/23/2021  9:14 AM      Passed - Last BP in normal range    BP Readings from Last 1 Encounters:  08/05/21 116/76         Passed - Last Heart Rate in normal range    Pulse Readings from Last 1 Encounters:  08/05/21 69         Passed - Valid encounter within last 12 months    Recent Outpatient Visits          3 months ago Severe episode of recurrent major depressive disorder, without psychotic features (HCC)   [facility] Larae Grooms, NP   4 months ago COPD mixed type [facility])   [facility] Larae Grooms, NP   5 months ago COPD mixed type [facility])   [facility] Larae Grooms, NP   6 months ago Thrush   [facility] Larae Grooms, NP   7 months ago Annual physical exam   [facility] Larae Grooms, NP

## 2021-09-30 DIAGNOSIS — Z720 Tobacco use: Secondary | ICD-10-CM | POA: Diagnosis not present

## 2021-09-30 DIAGNOSIS — R0602 Shortness of breath: Secondary | ICD-10-CM | POA: Diagnosis not present

## 2021-09-30 DIAGNOSIS — K219 Gastro-esophageal reflux disease without esophagitis: Secondary | ICD-10-CM | POA: Diagnosis not present

## 2021-09-30 DIAGNOSIS — Z419 Encounter for procedure for purposes other than remedying health state, unspecified: Secondary | ICD-10-CM | POA: Diagnosis not present

## 2021-09-30 DIAGNOSIS — Z20822 Contact with and (suspected) exposure to covid-19: Secondary | ICD-10-CM | POA: Diagnosis not present

## 2021-09-30 DIAGNOSIS — R0989 Other specified symptoms and signs involving the circulatory and respiratory systems: Secondary | ICD-10-CM | POA: Diagnosis not present

## 2021-09-30 DIAGNOSIS — J441 Chronic obstructive pulmonary disease with (acute) exacerbation: Secondary | ICD-10-CM | POA: Diagnosis not present

## 2021-09-30 DIAGNOSIS — E785 Hyperlipidemia, unspecified: Secondary | ICD-10-CM | POA: Diagnosis not present

## 2021-09-30 DIAGNOSIS — M79602 Pain in left arm: Secondary | ICD-10-CM | POA: Diagnosis not present

## 2021-09-30 DIAGNOSIS — R059 Cough, unspecified: Secondary | ICD-10-CM | POA: Diagnosis not present

## 2021-09-30 DIAGNOSIS — L03114 Cellulitis of left upper limb: Secondary | ICD-10-CM | POA: Diagnosis not present

## 2021-10-09 ENCOUNTER — Other Ambulatory Visit: Payer: Self-pay

## 2021-10-09 NOTE — Telephone Encounter (Signed)
Rx refill request for cyclobenzaprine 10 MG. Last rx sent on 07/22/21 #90 0RF. Last seen 05/22/2021. No future visits scheduled. Please advise.

## 2021-10-15 ENCOUNTER — Other Ambulatory Visit: Payer: Self-pay

## 2021-10-15 ENCOUNTER — Other Ambulatory Visit: Payer: Self-pay | Admitting: Nurse Practitioner

## 2021-10-15 NOTE — Telephone Encounter (Signed)
Requested medication (s) are due for refill today: yes  Requested medication (s) are on the active medication list: yes  Last refill:  09/24/21 #18g 0 refills  Future visit scheduled: no  Notes to clinic:  do you want to give additional refills?     Requested Prescriptions  Pending Prescriptions Disp Refills   VENTOLIN HFA 108 (90 Base) MCG/ACT inhaler [Pharmacy Med Name: Ventolin HFA 108 (90 Base) MCG/ACT Inhalation Aerosol Solution] 18 g 0    Sig: INHALE 2 PUFFS INTO LUNGS EVERY 6 HOURS AS NEEDED FOR WHEEZING FOR SHORTNESS OF BREATH     Pulmonology:  [facility] 2 Passed - 10/15/2021  9:20 AM      Passed - Last BP in normal range    BP Readings from Last 1 Encounters:  08/05/21 116/76         Passed - Last Heart Rate in normal range    Pulse Readings from Last 1 Encounters:  08/05/21 69         Passed - Valid encounter within last 12 months    Recent Outpatient Visits           4 months ago Severe episode of recurrent major depressive disorder, without psychotic features (HCC)   [facility] Larae Grooms, NP   4 months ago COPD mixed type [facility])   [facility] Larae Grooms, NP   6 months ago COPD mixed type [facility])   [facility] Larae Grooms, NP   7 months ago Thrush   [facility] Larae Grooms, NP   8 months ago Annual physical exam   [facility] Larae Grooms, NP

## 2021-10-15 NOTE — Telephone Encounter (Signed)
Rx refill request from [facility] for Cyclobenzaprine 10 MG, last filled 07/22/21 #90 with 0RF. Last seen in the office on 05/29/2021. No upcoming appt with Larae Grooms. Please advise.

## 2021-10-21 ENCOUNTER — Ambulatory Visit (INDEPENDENT_AMBULATORY_CARE_PROVIDER_SITE_OTHER): Payer: Medicaid Other | Admitting: Physician Assistant

## 2021-10-21 ENCOUNTER — Encounter: Payer: Self-pay | Admitting: Physician Assistant

## 2021-10-21 VITALS — BP 99/63 | HR 80 | Temp 97.9°F | Wt 147.7 lb

## 2021-10-21 DIAGNOSIS — R000 Tachycardia, unspecified: Secondary | ICD-10-CM

## 2021-10-21 DIAGNOSIS — F332 Major depressive disorder, recurrent severe without psychotic features: Secondary | ICD-10-CM

## 2021-10-21 DIAGNOSIS — I341 Nonrheumatic mitral (valve) prolapse: Secondary | ICD-10-CM | POA: Diagnosis not present

## 2021-10-21 DIAGNOSIS — G8929 Other chronic pain: Secondary | ICD-10-CM

## 2021-10-21 DIAGNOSIS — K219 Gastro-esophageal reflux disease without esophagitis: Secondary | ICD-10-CM | POA: Diagnosis not present

## 2021-10-21 DIAGNOSIS — J449 Chronic obstructive pulmonary disease, unspecified: Secondary | ICD-10-CM

## 2021-10-21 MED ORDER — GABAPENTIN 300 MG PO CAPS: 300.0000 mg | ORAL_CAPSULE | Freq: Four times a day (QID) | ORAL | 2 refills | Status: DC

## 2021-10-21 MED ORDER — VENTOLIN HFA 108 (90 BASE) MCG/ACT IN AERS: 2.0000 | INHALATION_SPRAY | Freq: Four times a day (QID) | RESPIRATORY_TRACT | 1 refills | Status: DC | PRN

## 2021-10-21 MED ORDER — BUDESONIDE-FORMOTEROL FUMARATE 160-4.5 MCG/ACT IN AERO: 2.0000 | INHALATION_SPRAY | Freq: Two times a day (BID) | RESPIRATORY_TRACT | 3 refills | Status: DC

## 2021-10-21 MED ORDER — DULOXETINE HCL 20 MG PO CPEP: 20.0000 mg | ORAL_CAPSULE | Freq: Every day | ORAL | 1 refills | Status: DC

## 2021-10-21 MED ORDER — QUETIAPINE FUMARATE 25 MG PO TABS: 50.0000 mg | ORAL_TABLET | Freq: Two times a day (BID) | ORAL | 2 refills | Status: DC

## 2021-10-21 MED ORDER — METOPROLOL TARTRATE 25 MG PO TABS: 25.0000 mg | ORAL_TABLET | Freq: Two times a day (BID) | ORAL | 1 refills | Status: DC

## 2021-10-21 MED ORDER — BENZONATATE 100 MG PO CAPS: 200.0000 mg | ORAL_CAPSULE | Freq: Three times a day (TID) | ORAL | 1 refills | Status: DC | PRN

## 2021-10-21 MED ORDER — PANTOPRAZOLE SODIUM 20 MG PO TBEC: 20.0000 mg | DELAYED_RELEASE_TABLET | Freq: Two times a day (BID) | ORAL | 1 refills | Status: DC

## 2021-10-21 MED ORDER — SPIRIVA RESPIMAT 2.5 MCG/ACT IN AERS
2.0000 | INHALATION_SPRAY | Freq: Every day | RESPIRATORY_TRACT | 1 refills | Status: DC
Start: 2021-10-21 — End: 2022-04-02

## 2021-10-21 MED ORDER — CYCLOBENZAPRINE HCL 10 MG PO TABS: 10.0000 mg | ORAL_TABLET | Freq: Three times a day (TID) | ORAL | 0 refills | Status: DC | PRN

## 2021-10-21 NOTE — Assessment & Plan Note (Signed)
Chronic, historic condition Currently using Spiriva, Symbicort and Ventolin for symptoms as well as Tessalon pearls for cough Refill provided today Continue current medications Recommend flu shot in in about 6 weeks for fall  Follow up in 3 months for monitoring and medication management

## 2021-10-21 NOTE — Progress Notes (Signed)
Established Patient Office Visit  Name: Karen Foster   MRN: [id number]    DOB: [birth year]   Date:10/21/2021  Today's Provider: Talitha Givens, MHS, PA-C Introduced myself to the patient as a PA-C and provided education on APPs in clinical practice.         Subjective  Chief Complaint  Chief Complaint  Patient presents with   Depression   Hyperlipidemia   Hypertension    Depression        Associated symptoms include no headaches. Hyperlipidemia Pertinent negatives include no chest pain or shortness of breath.  Hypertension Pertinent negatives include no blurred vision, chest pain, headaches, palpitations or shortness of breath.     HYPERTENSION / HYPERLIPIDEMIA Satisfied with current treatment? yes Duration of hypertension: chronic BP monitoring frequency: daily BP range: 115/65 BP medication side effects: yes- reports some low BP with metoprolol  Past BP meds:  Metoprolol  Duration of hyperlipidemia: chronic Cholesterol medication side effects: no Cholesterol supplements: none Past cholesterol medications: rosuvastatin (crestor) Medication compliance: excellent compliance Aspirin: no Recent stressors: no Recurrent headaches: no Visual changes: no Palpitations: no Dyspnea: no Chest pain: no Lower extremity edema: no Dizzy/lightheaded: no   DEPRESSION Mood status: stable Satisfied with current treatment?: yes Symptom severity: mild  Duration of current treatment : years Side effects: no Medication compliance: excellent compliance Psychotherapy/counseling: no    Previous psychiatric medications: cymbalta and seroquel Depressed mood: no Anxious mood: no Anhedonia: no Significant weight loss or gain: yes Insomnia: no    Fatigue: no Feelings of worthlessness or guilt: no Impaired concentration/indecisiveness: no Suicidal ideations: no Hopelessness: no Crying spells: no    10/21/2021    3:15 PM 05/22/2021    1:08 PM 04/11/2021   10:53 AM  02/28/2021   10:55 AM 02/14/2021    2:15 PM  Depression screen PHQ 2/9  Decreased Interest 1 0 0 0 0  Down, Depressed, Hopeless 0 0 0 0 0  PHQ - 2 Score 1 0 0 0 0  Altered sleeping _0 Tired, decreased energy 0 _1 Change in appetite 0 0 0 0 1  Feeling bad or failure about yourself  0 0 0 0 0  Trouble concentrating 0 0 0 0 0  Moving slowly or fidgety/restless 0 0 0 0 0  Suicidal thoughts 0 0 0 0 0  PHQ-9 Score _2 Difficult doing work/chores Not difficult at all Not difficult at all Not difficult at all Not difficult at all Not difficult at all      10/21/2021    3:16 PM 05/22/2021    1:08 PM 04/11/2021   10:53 AM 02/28/2021   10:56 AM  GAD 7 : Generalized Anxiety Score  Nervous, Anxious, on Edge _3 Control/stop worrying 1 0 0 0  Worry too much - different things 0 0 0 0  Trouble relaxing _4 Restless 1 1 0 0  Easily annoyed or irritable 1 0 0 0  Afraid - awful might happen 0 0 0 0  Total GAD 7 Score _5 Anxiety Difficulty Not difficult at all Not difficult at all Not difficult at all Not difficult at all       Patient Active Problem List   Diagnosis Date Noted   Gastroesophageal reflux disease 10/21/2021   Tachycardia 10/21/2021   Mitral valve prolapse 10/21/2021  Other chronic pain 12/25/2020   Allergic rhinitis 05/26/2019   Urticaria 12/01/2018   COPD mixed type ([place]) 12/01/2018   Cigarette smoker 06/08/2018   Arthritis 02/19/2018   Hemorrhoids 02/18/2018   Reactive airway disease 02/18/2018   Migraines 12/12/2017   Deviated septum 12/03/2017   Heart murmur 12/03/2017   Depression 12/03/2017   Panic disorder     Past Surgical History:  Procedure Laterality Date   FRACTURE SURGERY     JOINT REPLACEMENT  2011   Hip   REVISION TOTAL HIP ARTHROPLASTY  2011   SEPTOPLASTY  2007   TONSILLECTOMY     TOTAL ABDOMINAL HYSTERECTOMY W/ BILATERAL SALPINGOOPHORECTOMY      Family History  Problem Relation Age of Onset   Kidney  failure Mother    Heart attack Mother    Asthma Mother    Kidney disease Mother    Diabetes Mother    Cancer Father    Heart disease Maternal Aunt    Hypertension Maternal Aunt    Cancer Maternal Uncle    Asthma Maternal Uncle    Heart attack Paternal Aunt    Heart attack Paternal Uncle    Diabetes Paternal Uncle    Kidney failure Maternal Grandmother    Multiple myeloma Maternal Grandmother    Other Maternal Grandmother        multiple myeloma   Heart attack Maternal Grandfather    Hyperlipidemia Maternal Grandfather    Diabetes Maternal Grandfather    Prostate cancer Neg Hx    Bladder Cancer Neg Hx     Social History   Tobacco Use   Smoking status: Every Day    Packs/day: 0.50    Years: 40.00    Total pack years: 20.00    Types: Cigarettes   Smokeless tobacco: Never  Substance Use Topics   Alcohol use: No     Current Outpatient Medications:    Aspirin-Acetaminophen (GOODYS BODY PAIN PO), Take by mouth., Disp: , Rfl:    QUEtiapine (SEROQUEL) 25 MG tablet, Take 2-3 tablets (50-75 mg total) by mouth 2 (two) times daily. Take up to 50 mg (two tablets) in the morning and up to 75 mg ( 3 tablets) at night., Disp: 180 tablet, Rfl: 2   rosuvastatin (CRESTOR) 10 MG tablet, Take 1 tablet (10 mg total) by mouth daily., Disp: 90 tablet, Rfl: 1   benzonatate (TESSALON) 100 MG capsule, Take 2 capsules (200 mg total) by mouth 3 (three) times daily as needed for cough., Disp: 90 capsule, Rfl: 1   budesonide-formoterol (SYMBICORT) 160-4.5 MCG/ACT inhaler, Inhale 2 puffs into the lungs 2 (two) times daily., Disp: 1 each, Rfl: 3   cyclobenzaprine (FLEXERIL) 10 MG tablet, Take 1 tablet (10 mg total) by mouth 3 (three) times daily as needed for muscle spasms., Disp: 90 tablet, Rfl: 0   DULoxetine (CYMBALTA) 20 MG capsule, Take 1 capsule (20 mg total) by mouth daily., Disp: 90 capsule, Rfl: 1   gabapentin (NEURONTIN) 300 MG capsule, Take 1 capsule (300 mg total) by mouth 4 (four) times  daily., Disp: 120 capsule, Rfl: 2   metoprolol tartrate (LOPRESSOR) 25 MG tablet, Take 1 tablet (25 mg total) by mouth 2 (two) times daily., Disp: 180 tablet, Rfl: 1   pantoprazole (PROTONIX) 20 MG tablet, Take 1 tablet (20 mg total) by mouth 2 (two) times daily., Disp: 180 tablet, Rfl: 1   Tiotropium Bromide Monohydrate (SPIRIVA RESPIMAT) 2.5 MCG/ACT AERS, Inhale 2 puffs into the lungs daily., Disp: 4 g, Rfl: 1  VENTOLIN HFA 108 (90 Base) MCG/ACT inhaler, Inhale 2 puffs into the lungs every 6 (six) hours as needed for wheezing or shortness of breath., Disp: 18 g, Rfl: 1  Allergies  Allergen Reactions   Bee Venom Anaphylaxis   Ibuprofen Anaphylaxis   Sulfa Antibiotics Anaphylaxis   Tomato Anaphylaxis and Nausea And Vomiting   Hydroxyzine Nausea Only    I personally reviewed active problem list, medication list, allergies, health maintenance, notes from last encounter, lab results with the patient/caregiver today.   Review of Systems  Constitutional:  Negative for chills and fever.  Eyes:  Negative for blurred vision and double vision.  Respiratory:  Negative for shortness of breath.   Cardiovascular:  Negative for chest pain and palpitations.  Gastrointestinal:  Negative for diarrhea, nausea and vomiting.  Neurological:  Negative for dizziness and headaches.  Psychiatric/Behavioral:  Positive for depression.       Objective  Vitals:   10/21/21 1501  BP: 99/63  Pulse: 80  Temp: 97.9 F (36.6 C)  TempSrc: Oral  SpO2: 98%  Weight: 147 lb 11.2 oz (67 kg)    Body mass index is 24.2 kg/m.  Physical Exam Vitals reviewed.  Constitutional:      General: She is awake.     Appearance: Normal appearance. She is well-developed and normal weight.  HENT:     Head: Normocephalic and atraumatic.  Cardiovascular:     Rate and Rhythm: Normal rate and regular rhythm.     Pulses:          Radial pulses are 2+ on the right side and 2+ on the left side.     Heart sounds: Normal  heart sounds. No murmur heard.    No friction rub. No gallop.  Pulmonary:     Effort: Pulmonary effort is normal.     Breath sounds: Decreased air movement present. Examination of the right-lower field reveals decreased breath sounds. Examination of the left-lower field reveals decreased breath sounds. Decreased breath sounds present. No wheezing, rhonchi or rales.  Musculoskeletal:     Cervical back: Normal range of motion and neck supple.     Right lower leg: No edema.     Left lower leg: No edema.  Neurological:     Mental Status: She is alert.  Psychiatric:        Attention and Perception: Attention and perception normal.        Mood and Affect: Mood and affect normal.        Speech: Speech normal.        Behavior: Behavior normal. Behavior is cooperative.      No results found for this or any previous visit (from the past 2160 hour(s)).   PHQ2/9:    10/21/2021    3:15 PM 05/22/2021    1:08 PM 04/11/2021   10:53 AM 02/28/2021   10:55 AM 02/14/2021    2:15 PM  Depression screen PHQ 2/9  Decreased Interest 1 0 0 0 0  Down, Depressed, Hopeless 0 0 0 0 0  PHQ - 2 Score 1 0 0 0 0  Altered sleeping _0 Tired, decreased energy 0 _1 Change in appetite 0 0 0 0 1  Feeling bad or failure about yourself  0 0 0 0 0  Trouble concentrating 0 0 0 0 0  Moving slowly or fidgety/restless 0 0 0 0 0  Suicidal thoughts 0 0 0 0 0  PHQ-9 Score 2 2  _0 Difficult doing work/chores Not difficult at all Not difficult at all Not difficult at all Not difficult at all Not difficult at all      Fall Risk:    10/21/2021    3:13 PM 05/22/2021    1:08 PM 04/11/2021   10:53 AM 02/28/2021   10:54 AM 02/14/2021    2:14 PM  [place] in the past year? 0 0 0 0 0  Number falls in past yr: 0 0 0 0 0  Injury with Fall? 0 0 0 0 0  Risk for fall due to : _1   Follow up Falls evaluation completed Falls evaluation  completed Falls evaluation completed Falls evaluation completed Falls evaluation completed      Functional Status Survey:      Assessment & Plan  Problem List Items Addressed This Visit       Cardiovascular and Mediastinum   Mitral valve prolapse    Chronic, historic condition Confirmed with Echo in May 2023 Managed with metoprolol 25 mg PO BID - continue this as long as BP is not lowered too much Patient is followed by Cardiology- continue collaboration  Continue with medications  Follow up in 3 months for monitoring       Relevant Medications   metoprolol tartrate (LOPRESSOR) 25 MG tablet     Respiratory   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    Chronic, historic condition Currently using Spiriva, Symbicort and Ventolin for symptoms as well as Tessalon pearls for cough Refill provided today Continue current medications Recommend flu shot in in about 6 weeks for fall  Follow up in 3 months for monitoring and medication management      Relevant Medications   benzonatate (TESSALON) 100 MG capsule   budesonide-formoterol (SYMBICORT) 160-4.5 MCG/ACT inhaler   Tiotropium Bromide Monohydrate (SPIRIVA RESPIMAT) 2.5 MCG/ACT AERS   VENTOLIN HFA 108 (90 Base) MCG/ACT inhaler     Digestive   Gastroesophageal reflux disease    Chronic, historic condition Appears well managed with Protonix Continue medication and avoid trigger foods Follow up in 3 months for monitoring and medication management       Relevant Medications   pantoprazole (PROTONIX) 20 MG tablet     Other   Depression    Chronic, historic condition,  Currently taking Duloxetine 20 mg PO QD and Quetiapine 25 mg PO 2 up to 2 tablets in AM and up to 3 tablets QHS Continue current medications  Follow up in 3 months for monitoring and medication management       Relevant Medications   QUEtiapine (SEROQUEL) 25 MG tablet   DULoxetine (CYMBALTA) 20 MG capsule   Other chronic pain    Chronic, historic condition   Appears well controlled with flexeril 10 mg PO TID PRN, Gabapentin 300 mg PO QID, and Duloxetine 20 mg PO QD Continue current medications Follow up in 3 months for monitoring and routine management       Relevant Medications   cyclobenzaprine (FLEXERIL) 10 MG tablet   DULoxetine (CYMBALTA) 20 MG capsule   gabapentin (NEURONTIN) 300 MG capsule   Tachycardia    See A&P for mitral valve prolapse       Relevant Medications   metoprolol tartrate (LOPRESSOR) 25 MG tablet     Return in about 3 months (around 01/21/2022) for COPD, depression and HLD.   I,  E , PA-C,  have reviewed all documentation for this visit. The documentation on 10/21/21 for the exam, diagnosis, procedures, and orders are all accurate and complete.   Talitha Givens, MHS, PA-C [facility]

## 2021-10-21 NOTE — Assessment & Plan Note (Signed)
Chronic, historic condition  Appears well controlled with flexeril 10 mg PO TID PRN, Gabapentin 300 mg PO QID, and Duloxetine 20 mg PO QD Continue current medications Follow up in 3 months for monitoring and routine management

## 2021-10-21 NOTE — Assessment & Plan Note (Signed)
Chronic, historic condition,  Currently taking Duloxetine 20 mg PO QD and Quetiapine 25 mg PO 2 up to 2 tablets in AM and up to 3 tablets QHS Continue current medications  Follow up in 3 months for monitoring and medication management

## 2021-10-21 NOTE — Assessment & Plan Note (Signed)
See A&P for mitral valve prolapse

## 2021-10-21 NOTE — Assessment & Plan Note (Signed)
Chronic, historic condition Confirmed with Echo in May 2023 Managed with metoprolol 25 mg PO BID - continue this as long as BP is not lowered too much Patient is followed by Cardiology- continue collaboration  Continue with medications  Follow up in 3 months for monitoring

## 2021-10-21 NOTE — Assessment & Plan Note (Signed)
Chronic, historic condition Appears well managed with Protonix Continue medication and avoid trigger foods Follow up in 3 months for monitoring and medication management

## 2021-10-24 ENCOUNTER — Telehealth: Payer: Self-pay | Admitting: Nurse Practitioner

## 2021-10-24 NOTE — Telephone Encounter (Signed)
Sarah calling from [facility] is calling to a verbal ok that the pt is need of a quantity increase on  QUEtiapine (SEROQUEL) 25 MG tablet [[id number]] Cb- [phone]

## 2021-10-24 NOTE — Telephone Encounter (Signed)
Verbal okay to dispense 180 tabs. Spoke with pharmacy.

## 2021-10-24 NOTE — Telephone Encounter (Signed)
Quantity was for 180 tabs.  If she ends up using 3 a day I will fill it sooner.  For now only 180 tabs to be dispensed.

## 2021-10-31 DIAGNOSIS — Z419 Encounter for procedure for purposes other than remedying health state, unspecified: Secondary | ICD-10-CM | POA: Diagnosis not present

## 2021-11-07 DIAGNOSIS — M25552 Pain in left hip: Secondary | ICD-10-CM | POA: Diagnosis not present

## 2021-11-07 DIAGNOSIS — Z87898 Personal history of other specified conditions: Secondary | ICD-10-CM | POA: Diagnosis not present

## 2021-11-07 DIAGNOSIS — R059 Cough, unspecified: Secondary | ICD-10-CM | POA: Diagnosis not present

## 2021-11-07 DIAGNOSIS — J441 Chronic obstructive pulmonary disease with (acute) exacerbation: Secondary | ICD-10-CM | POA: Diagnosis not present

## 2021-11-07 DIAGNOSIS — G8929 Other chronic pain: Secondary | ICD-10-CM | POA: Diagnosis not present

## 2021-11-07 DIAGNOSIS — Z96642 Presence of left artificial hip joint: Secondary | ICD-10-CM | POA: Diagnosis not present

## 2021-11-10 ENCOUNTER — Other Ambulatory Visit: Payer: Self-pay

## 2021-11-10 NOTE — Patient Outreach (Signed)
Care Coordination  11/10/2021  NA WALDRIP 1960/04/11 [id number]  Successful outreach with Ms. Mcgregory. However, she is working today and would like to reschedule to another day after 3:30pm. A new appointment was scheduled for 11/20/21 @ 3:45pm.  Estanislado Emms RN, BSN [place]  [facility] RN Care Coordinator

## 2021-11-20 ENCOUNTER — Other Ambulatory Visit: Payer: Self-pay | Admitting: *Deleted

## 2021-11-20 NOTE — Patient Instructions (Signed)
Visit Information  Ms. [name]  - as a part of your Medicaid benefit, you are eligible for care management and care coordination services at no cost or copay. I was unable to reach you by phone today but would be happy to help you with your health related needs. Please feel free to call me @ [phone].   A member of the Managed Medicaid care management team will reach out to you again over the next 14 days.   Lurena Joiner RN, BSN [place]  [employer] [employer] manager

## 2021-11-20 NOTE — Patient Outreach (Signed)
  Medicaid Managed Care   Unsuccessful Attempt Note   11/20/2021 Name: Karen Foster MRN: [id number] DOB: 21-Jan-1961  Referred by: Jon Billings, NP Reason for referral : High Risk Managed Medicaid (Unsuccessful RNCM follow up telephone outreach)   An unsuccessful telephone outreach was attempted today. The patient was referred to the case management team for assistance with care management and care coordination.    Follow Up Plan: The Managed Medicaid care management team will reach out to the patient again over the next 14 days.    Lurena Joiner RN, BSN [place]  [employer] [employer] manager

## 2021-11-30 DIAGNOSIS — Z419 Encounter for procedure for purposes other than remedying health state, unspecified: Secondary | ICD-10-CM | POA: Diagnosis not present

## 2021-12-03 ENCOUNTER — Other Ambulatory Visit: Payer: Self-pay | Admitting: *Deleted

## 2021-12-03 NOTE — Patient Outreach (Signed)
Medicaid Managed Care   Nurse Care Manager Note  12/03/2021 Name:  Karen Foster MRN:  [id number] DOB:  [birth year]  Karen Foster is an 61 y.o. year old female who is Foster primary patient of Karen Billings, NP.  The [facility] Managed Care Coordination team was consulted for assistance with:    HLD COPD  Karen Foster was given information about Medicaid Managed Care Coordination team services today. Karen Foster Patient agreed to services and verbal consent obtained.  Engaged with patient by telephone for follow up visit in response to provider referral for case management and/or care coordination services.   Assessments/Interventions:  Review of past medical history, allergies, medications, health status, including review of consultants reports, laboratory and other test data, was performed as part of comprehensive evaluation and provision of chronic care management services.  SDOH (Social Determinants of Health) assessments and interventions performed: SDOH Interventions    Flowsheet Row Patient Outreach Telephone from 12/03/2021 in [place] Coordination Office Visit from 10/21/2021 in [place] Patient Outreach Telephone from 08/05/2021 in [place] Coordination Telephone from 06/24/2021 in [place] Coordination Patient Outreach Telephone from 06/20/2021 in [facility] Office Visit from 05/22/2021 in [place] Interventions        Food Insecurity Interventions Intervention Not Indicated  [Patient reports no food insecurity at this time] -- Intervention Not Indicated Other (Comment)  [Per patient request emailed [employer] list.] Other (Comment)  Engineer, maintenance Guide referral for local food pantries] --  Housing Interventions -- -- -- -- Intervention Not Indicated --  Transportation Interventions -- -- Intervention Not Indicated --  -- --  Depression Interventions/Treatment  -- Medication -- -- -- Currently on Treatment, Medication       Care Plan  Allergies  Allergen Reactions   Bee Venom Anaphylaxis   Ibuprofen Anaphylaxis   Sulfa Antibiotics Anaphylaxis   Tomato Anaphylaxis and Nausea And Vomiting   Hydroxyzine Nausea Only    Medications Reviewed Today     Reviewed by Karen Montane, RN (Registered Nurse) on 12/03/21 at [place] List Status: <None>   Medication Order Taking? Sig Documenting Provider Last Dose Status Informant  Aspirin-Acetaminophen (GOODYS BODY PAIN PO) [id number] Yes Take by mouth. [provider] Taking Active   budesonide-formoterol (SYMBICORT) 160-4.5 MCG/ACT inhaler [id number] Yes Inhale 2 puffs into the lungs 2 (two) times daily. Foster, Karen E, PA-C Taking Active   cyclobenzaprine (FLEXERIL) 10 MG tablet [id number] Yes Take 1 tablet (10 mg total) by mouth 3 (three) times daily as needed for muscle spasms. Foster, Karen E, PA-C Taking Active   DULoxetine (CYMBALTA) 20 MG capsule [id number] Yes Take 1 capsule (20 mg total) by mouth daily. Foster, Karen E, PA-C Taking Active   gabapentin (NEURONTIN) 300 MG capsule [id number] Yes Take 1 capsule (300 mg total) by mouth 4 (four) times daily. Foster, Karen E, PA-C Taking Active   metoprolol tartrate (LOPRESSOR) 25 MG tablet [id number] Yes Take 1 tablet (25 mg total) by mouth 2 (two) times daily. Foster, Karen E, PA-C Taking Active   pantoprazole (PROTONIX) 20 MG tablet [id number] Yes Take 1 tablet (20 mg total) by mouth 2 (two) times daily. Foster, Karen E, PA-C Taking Active   QUEtiapine (SEROQUEL) 25 MG tablet [id number] Yes Take 2-3 tablets (50-75 mg total) by mouth 2 (two) times daily. Take up to 50 mg (two tablets) in the morning and  up to 75 mg ( 3 tablets) at night. Foster, Karen Conroy, PA-C Taking Active            Med Note (Karen Foster   Wed Dec 03, 2021  4:08 PM) Taking PRN  rosuvastatin (CRESTOR) 10 MG tablet [id number] Yes Take 1 tablet (10 mg  total) by mouth daily. Karen Grooms, NP Taking Active   Tiotropium Bromide Monohydrate (SPIRIVA RESPIMAT) 2.5 MCG/ACT AERS [id number] Yes Inhale 2 puffs into the lungs daily. Foster, Karen Conroy, PA-C Taking Active   VENTOLIN HFA 108 (90 Base) MCG/ACT inhaler [id number] Yes Inhale 2 puffs into the lungs every 6 (six) hours as needed for wheezing or shortness of breath. Foster, Karen Conroy, PA-C Taking Active             Patient Active Problem List   Diagnosis Date Noted   Gastroesophageal reflux disease 10/21/2021   Tachycardia 10/21/2021   Mitral valve prolapse 10/21/2021   Other chronic pain 12/25/2020   Allergic rhinitis 05/26/2019   COPD mixed type ([facility]) 12/01/2018   Cigarette smoker 06/08/2018   Arthritis 02/19/2018   Hemorrhoids 02/18/2018   Reactive airway disease 02/18/2018   Migraines 12/12/2017   Deviated septum 12/03/2017   Heart murmur 12/03/2017   Depression 12/03/2017   Panic disorder     Conditions to be addressed/monitored per PCP order:  HLD and COPD  Care Plan : RN Care Manager Plan of Care  Updates made by Karen Dach, RN since 12/03/2021 12:00 AM     Problem: Chronic health managment needs related to COPD      Long-Range Goal: Development of Plan of Care to address health managment needs related to COPD   Start Date: 01/27/2021  Expected End Date: 10/30/2021  Priority: High  Note:   Current Barriers:  Chronic Disease Management support and education needs related to HLD Karen Foster has been helping Foster friend in another county. She reports taking her medications, eating better, smoking less and feeling better. She will follow up on requesting Pulmonology referral when she returns from the beach. She denies any needs at this time.  RNCM Clinical Goal(s):  Patient will verbalize understanding of plan for management of HLD as evidenced by patient verbalization and self monitoring activity take all medications exactly as prescribed and will call provider for  medication related questions as evidenced by documentation in EMR    attend all scheduled medical appointments: reschedule with PCP as evidenced by provider documentation in EMR        continue to work with RN Care Manager and/or Social Worker to address care management and care coordination needs related to HLD as evidenced by adherence to CM Team Scheduled appointments     through collaboration with Medical illustrator, provider, and care team.   Interventions: Inter-disciplinary care team collaboration (see longitudinal plan of care) Evaluation of current treatment plan related to  self management and patient's adherence to plan as established by provider Provided therapeutic listening   COPD Interventions:  (Status:  Goal on track:  Yes.) Long Term Goal Provided education about and advised patient to utilize infection prevention strategies to reduce risk of respiratory infection Assessed social determinant of health barriers Congratulated patient on cutting back on smoking from 1pk/day to 1/2pk/day, encouraged her to continue working on smoking cessation Advised patient to increase exercise activity to 150 minutes per week   Hyperlipidemia:  (Status: Condition stable. Not addressed this visit.) Long Term Goal  Lab Results  Component Value Date  CHOL 137 02/14/2021   HDL 22 (L) 02/14/2021   LDLCALC 46 02/14/2021   TRIG 470 (H) 02/14/2021   CHOLHDL 6.2 (H) 02/14/2021     Medication review performed; medication list updated in electronic medical record.  Counseled on importance of regular laboratory monitoring as prescribed; Provided HLD educational materials; Reviewed exercise goals and target of 150 minutes per week; Assessed social determinant of health barriers;  Encouraged patient to discuss any concerns or questions with provider Encouraged smoking cessation-patient down to 1/2 pack/day Advised patient to increase vegetables and fruits in her diet, cut down on pepsi and increase  water intake  Patient Goals/Self-Care Activities: Take medications as prescribed   Attend all scheduled provider appointments Call pharmacy for medication refills 3-7 days in advance of running out of medications Attend church or other social activities Perform all self care activities independently  Call provider office for new concerns or questions  - adhere to prescribed diet: heart healthy - develop an exercise routine       Follow Up:  Patient agrees to Care Plan and Follow-up.  Plan: The Managed Medicaid care management team will reach out to the patient again over the next 60 days.  Date/time of next scheduled RN care management/care coordination outreach:  02/02/22 @ 3:45pm  Estanislado Emms RN, BSN [place]  [facility]

## 2021-12-03 NOTE — Patient Instructions (Signed)
Visit Information  Karen Foster was given information about Medicaid Managed Care team care coordination services as a part of their [facility] Medicaid benefit. Karen Foster verbally consented to engagement with the [facility] Managed Care team.   If you are experiencing a medical emergency, please call 911 or report to your local emergency department or urgent care.   If you have a non-emergency medical problem during routine business hours, please contact your provider's office and ask to speak with a nurse.   For questions related to your [facility] health plan, please call: [phone] or go here:[email or web address]  If you would like to schedule transportation through your [facility] plan, please call the following number at least 2 days in advance of your appointment: [phone].  You can also use the MTM portal or MTM mobile app to manage your rides. For the portal, please go to [email or web address].  Call the [place] at [phone], at any time, 24 hours a day, 7 days a week. If you are in danger or need immediate medical attention call 911.  If you would like help to quit smoking, call 1-800-QUIT-NOW (785) 126-9447) OR Espaol: 1-855-Djelo-Ya (9-735-329-9242) o para ms informacin haga clic aqu or Text READY to 200-400 to register via text  Karen Foster,   Please see education materials related to COPD and HLD provided by MyChart link.  Patient verbalizes understanding of instructions and care plan provided today and agrees to view in [place]. Active MyChart status and patient understanding of how to access instructions and care plan via MyChart confirmed with patient.     Telephone follow up appointment with Managed Medicaid care management team member scheduled for:02/02/22 @ 3:45pm  Lurena Joiner RN, BSN [place] RN Care Coordinator   Following is a copy of your plan of care:  Care Plan : RN Care Manager  Plan of Care  Updates made by Melissa Montane, RN since 12/03/2021 12:00 AM     Problem: Chronic health managment needs related to COPD      Long-Range Goal: Development of Plan of Care to address health managment needs related to COPD   Start Date: 01/27/2021  Expected End Date: 10/30/2021  Priority: High  Note:   Current Barriers:  Chronic Disease Management support and education needs related to HLD Karen Foster has been helping a friend in another county. She reports taking her medications, eating better, smoking less and feeling better. She will follow up on requesting Pulmonology referral when she returns from the beach. She denies any needs at this time.  RNCM Clinical Goal(s):  Patient will verbalize understanding of plan for management of HLD as evidenced by patient verbalization and self monitoring activity take all medications exactly as prescribed and will call provider for medication related questions as evidenced by documentation in EMR    attend all scheduled medical appointments: reschedule with PCP as evidenced by provider documentation in EMR        continue to work with RN Care Manager and/or Social Worker to address care management and care coordination needs related to HLD as evidenced by adherence to CM Team Scheduled appointments     through collaboration with Consulting civil engineer, provider, and care team.   Interventions: Inter-disciplinary care team collaboration (see longitudinal plan of care) Evaluation of current treatment plan related to  self management and patient's adherence to plan as established by provider Provided therapeutic listening   COPD Interventions:  (Status:  Goal on track:  Yes.) Long Term Goal Provided education about and advised patient to utilize infection prevention strategies to reduce risk of respiratory infection Assessed social determinant of health barriers Congratulated patient on cutting back on smoking from 1pk/day to 1/2pk/day,  encouraged her to continue working on smoking cessation Advised patient to increase exercise activity to 150 minutes per week   Hyperlipidemia:  (Status: Condition stable. Not addressed this visit.) Long Term Goal  Lab Results  Component Value Date   CHOL 137 02/14/2021   HDL 22 (L) 02/14/2021   LDLCALC 46 02/14/2021   TRIG 470 (H) 02/14/2021   CHOLHDL 6.2 (H) 02/14/2021     Medication review performed; medication list updated in electronic medical record.  Counseled on importance of regular laboratory monitoring as prescribed; Provided HLD educational materials; Reviewed exercise goals and target of 150 minutes per week; Assessed social determinant of health barriers;  Encouraged patient to discuss any concerns or questions with provider Encouraged smoking cessation-patient down to 1/2 pack/day Advised patient to increase vegetables and fruits in her diet, cut down on pepsi and increase water intake  Patient Goals/Self-Care Activities: Take medications as prescribed   Attend all scheduled provider appointments Call pharmacy for medication refills 3-7 days in advance of running out of medications Attend church or other social activities Perform all self care activities independently  Call provider office for new concerns or questions  - adhere to prescribed diet: heart healthy - develop an exercise routine

## 2021-12-05 ENCOUNTER — Ambulatory Visit: Payer: Self-pay

## 2021-12-05 NOTE — Telephone Encounter (Signed)
  Chief Complaint: vaginal dryness Symptoms: vaginal dryness Frequency: this week Pertinent Negatives: Patient denies pain or itching Disposition: [] ED /[] Urgent Care (no appt availability in office) / [] Appointment(In office/virtual)/ []  [place] [facility]/ [] Home Care/ [] Refused Recommended Disposition /[] [place] [facility]/ [x]  Follow-up with PCP Additional Notes: pt states she is out of state in [facility] and wanting something prescribed for vaginal dryness. She has tried OTC while she has been there but nothing helping. Pt states she hadn't had intercourse in 10+ years and is very uncomfortable. I did advise that typically when out of state that providers were unable to prescribe but I would send message back and if they cannot she could try Replen's OTC. Pt verbalized understanding.   Summary: Vaginal Dryness Advice   Pt is calling for advice - She is out of town and would like to know what she can do for vaginal dryness. Please advise      Reason for Disposition  [1] Vaginal dryness or itching AND [2] nearing menopause or after menopause  Answer Assessment - Initial Assessment Questions 1. SYMPTOM: "What's the main symptom you're concerned about?" (e.g., pain, itching, dryness)     dryness 2. LOCATION: "Where is the  sx located?" (e.g., inside/outside, left/right)     Vaginal  3. ONSET: "When did the  sx  start?"     This week 4. PAIN: "Is there any pain?" If Yes, ask: "How bad is it?" (Scale: 1-10; mild, moderate, severe)   -  MILD (1-3): Doesn't interfere with normal activities.    -  MODERATE (4-7): Interferes with normal activities (e.g., work or school) or awakens from sleep.     -  SEVERE (8-10): Excruciating pain, unable to do any normal activities.     Mild to moderate, uncomfortable 5. ITCHING: "Is there any itching?" If Yes, ask: "How bad is it?" (Scale: 1-10; mild, moderate, severe)     no 6. CAUSE: "What do you think is causing the discharge?" "Have you had the same  problem before? What happened then?"     Not been sexual in 10 + years.  Protocols used: Vaginal Symptoms-A-AH

## 2021-12-08 NOTE — Telephone Encounter (Signed)
Patient needs a UA and wet prep due to symptoms coming on so suddenly.  Should be seen in office.

## 2021-12-08 NOTE — Telephone Encounter (Signed)
Patient will need an appt

## 2021-12-08 NOTE — Telephone Encounter (Signed)
Patient states she just got back to [place] this morning, she is not in town. Can you do a virtual appt?

## 2021-12-08 NOTE — Telephone Encounter (Signed)
Spoke with patient, states she is not coming to [place] often. Patient states " it did not come on sudden, I have not have sex in over 10 years". I advised patient if she feels like she needs to be seen sooner, she should go to a near by [facility] or ED. Pt voiced understanding.

## 2021-12-16 ENCOUNTER — Other Ambulatory Visit: Payer: Self-pay | Admitting: Nurse Practitioner

## 2021-12-16 ENCOUNTER — Other Ambulatory Visit: Payer: Self-pay | Admitting: Physician Assistant

## 2021-12-16 DIAGNOSIS — G8929 Other chronic pain: Secondary | ICD-10-CM

## 2021-12-16 NOTE — Telephone Encounter (Signed)
Requested medication (s) are due for refill today: yes  Requested medication (s) are on the active medication list: yes  Last refill:  07/21/21 #90/1  Future visit scheduled: yes  Notes to clinic:  Unable to refill per protocol due to failed labs, no updated results.      Requested Prescriptions  Pending Prescriptions Disp Refills   rosuvastatin (CRESTOR) 10 MG tablet [Pharmacy Med Name: Rosuvastatin Calcium 10 MG Oral Tablet] 90 tablet 0    Sig: Take 1 tablet by mouth once daily     Cardiovascular:  Antilipid - Statins 2 Failed - 12/16/2021  5:49 PM      Failed - Cr in normal range and within 360 days    Creatinine, Ser  Date Value Ref Range Status  02/14/2021 0.55 (L) 0.57 - 1.00 mg/dL Final         Failed - Lipid Panel in normal range within the last 12 months    Cholesterol, Total  Date Value Ref Range Status  02/14/2021 137 100 - 199 mg/dL Final   LDL Chol Calc (NIH)  Date Value Ref Range Status  02/14/2021 46 0 - 99 mg/dL Final   HDL  Date Value Ref Range Status  02/14/2021 22 (L) >39 mg/dL Final   Triglycerides  Date Value Ref Range Status  02/14/2021 470 (H) 0 - 149 mg/dL Final         Passed - Patient is not pregnant      Passed - Valid encounter within last 12 months    Recent Outpatient Visits           1 month ago COPD mixed type ([place])   [place], Erin E, PA-C   6 months ago Severe episode of recurrent major depressive disorder, without psychotic features ([place])   [place], Karen, NP   6 months ago COPD mixed type [facility])   [facility] Jon Billings, NP   8 months ago COPD mixed type [facility])   [facility] Jon Billings, NP   9 months ago [place] Jon Billings, NP       Future Appointments             In 1 month Jon Billings, NP [facility], [place]

## 2021-12-16 NOTE — Telephone Encounter (Signed)
Requested medication (s) are due for refill today: yes  Requested medication (s) are on the active medication list: yes  Last refill:  10/21/21 #90/0  Future visit scheduled: yes  Notes to clinic:  Unable to refill per protocol, cannot delegate.     Requested Prescriptions  Pending Prescriptions Disp Refills   cyclobenzaprine (FLEXERIL) 10 MG tablet [Pharmacy Med Name: Cyclobenzaprine HCl 10 MG Oral Tablet] 90 tablet 0    Sig: Take 1 tablet by mouth three times daily as needed for muscle spasm     Not Delegated - Analgesics:  Muscle Relaxants Failed - 12/16/2021  5:50 PM      Failed - This refill cannot be delegated      Passed - Valid encounter within last 6 months    Recent Outpatient Visits           1 month ago COPD mixed type ([place])   [place], Erin E, PA-C   6 months ago Severe episode of recurrent major depressive disorder, without psychotic features ([place])   [place], Karen, NP   6 months ago COPD mixed type [facility])   [facility] Jon Billings, NP   8 months ago COPD mixed type [facility])   [facility] Jon Billings, NP   9 months ago [place], NP       Future Appointments             In 1 month Jon Billings, NP [facility], [place]

## 2021-12-17 NOTE — Telephone Encounter (Signed)
Refill request for crestor 10 MG, Last seen 11/07/2021 , next OV 01/21/2022. Filled on   07/21/21 #90/1

## 2021-12-18 ENCOUNTER — Telehealth: Payer: Self-pay | Admitting: Nurse Practitioner

## 2021-12-18 ENCOUNTER — Encounter: Payer: Self-pay | Admitting: Physician Assistant

## 2021-12-18 ENCOUNTER — Ambulatory Visit (INDEPENDENT_AMBULATORY_CARE_PROVIDER_SITE_OTHER): Payer: Medicaid Other | Admitting: Physician Assistant

## 2021-12-18 ENCOUNTER — Telehealth: Payer: Self-pay

## 2021-12-18 VITALS — BP 134/81 | HR 85 | Temp 98.2°F | Ht 65.51 in | Wt 148.4 lb

## 2021-12-18 DIAGNOSIS — N760 Acute vaginitis: Secondary | ICD-10-CM | POA: Diagnosis not present

## 2021-12-18 DIAGNOSIS — R059 Cough, unspecified: Secondary | ICD-10-CM

## 2021-12-18 DIAGNOSIS — B9689 Other specified bacterial agents as the cause of diseases classified elsewhere: Secondary | ICD-10-CM | POA: Diagnosis not present

## 2021-12-18 DIAGNOSIS — J449 Chronic obstructive pulmonary disease, unspecified: Secondary | ICD-10-CM

## 2021-12-18 DIAGNOSIS — N898 Other specified noninflammatory disorders of vagina: Secondary | ICD-10-CM

## 2021-12-18 LAB — URINALYSIS, ROUTINE W REFLEX MICROSCOPIC
Bilirubin, UA: NEGATIVE
Glucose, UA: NEGATIVE
Ketones, UA: NEGATIVE
Leukocytes,UA: NEGATIVE
Nitrite, UA: NEGATIVE
Protein,UA: NEGATIVE
RBC, UA: NEGATIVE
Specific Gravity, UA: 1.01 (ref 1.005–1.030)
Urobilinogen, Ur: 0.2 mg/dL (ref 0.2–1.0)
pH, UA: 5.5 (ref 5.0–7.5)

## 2021-12-18 LAB — WET PREP FOR TRICH, YEAST, CLUE
Clue Cell Exam: POSITIVE — AB
Trichomonas Exam: NEGATIVE
Yeast Exam: NEGATIVE

## 2021-12-18 MED ORDER — METRONIDAZOLE 500 MG PO TABS: 500.0000 mg | ORAL_TABLET | Freq: Two times a day (BID) | ORAL | 0 refills | Status: DC

## 2021-12-18 MED ORDER — PREDNISONE 20 MG PO TABS: 40.0000 mg | ORAL_TABLET | Freq: Every day | ORAL | 0 refills | Status: AC

## 2021-12-18 MED ORDER — BENZONATATE 200 MG PO CAPS: 200.0000 mg | ORAL_CAPSULE | Freq: Two times a day (BID) | ORAL | 0 refills | Status: DC | PRN

## 2021-12-18 MED ORDER — ESTRADIOL 0.1 MG/GM VA CREA: 1.0000 | TOPICAL_CREAM | Freq: Every day | VAGINAL | 12 refills | Status: DC

## 2021-12-18 MED ORDER — MONTELUKAST SODIUM 10 MG PO TABS: 10.0000 mg | ORAL_TABLET | Freq: Every day | ORAL | 3 refills | Status: DC

## 2021-12-18 NOTE — Telephone Encounter (Signed)
Pt saw Junie Panning today and was prescribed benzonatate (TESSALON) 200 MG capsule Twice a day  However, Erin told pt she was going to increase this med. (Due to bad cough) But pt's previous Rx was for 2/ 100MG  TID Which is 600mg .  This is not an increase, but a decrease she said,  so pt would like to know what to to do?  Pt did not pick up this Rx.  Please advise.

## 2021-12-18 NOTE — Telephone Encounter (Signed)
Returned patient's call and informed her that she is to take 1 200 mg Benzonatate cap twice daily as prescribed.Patient verbalized understanding

## 2021-12-18 NOTE — Progress Notes (Signed)
Acute Office Visit   Patient: Karen Foster   DOB: Sep 06, 1960   61 y.o. Female  MRN: [id number] Visit Date: 12/18/2021  Today's healthcare provider: Oswaldo Conroy Amellia Panik, PA-C  Introduced myself to the patient as a Secondary school teacher and provided education on APPs in clinical practice.    Chief Complaint  Patient presents with   Cough    For past 2 weeks   Vaginal driness   Subjective    HPI HPI     Cough    Additional comments: For past 2 weeks      Last edited by Sherolyn Buba, CMA on 12/18/2021  2:52 PM.        She reports she is having a cough  She is having exacerbations every 2-3 months  She is taking tessalon perls but states this in not improving symptoms States this typically requires a Zpack, Tussionex and tessalon pearls to resolve She repeatedly mentioned getting Tussionex cough syrup during visit and mentioned that it can be obtained OTC for limited use.  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   budesonide-formoterol (SYMBICORT) 160-4.5 MCG/ACT inhaler Inhale 2 puffs into the lungs 2 (two) times daily.   cyclobenzaprine (FLEXERIL) 10 MG tablet Take 1 tablet by mouth three times daily as needed for muscle spasm   DULoxetine  (CYMBALTA) 20 MG capsule Take 1 capsule (20 mg total) by mouth daily.   gabapentin (NEURONTIN) 300 MG capsule Take 1 capsule (300 mg total) by mouth 4 (four) times daily.   metoprolol tartrate (LOPRESSOR) 25 MG tablet Take 1 tablet (25 mg total) by mouth 2 (two) times daily.   pantoprazole (PROTONIX) 20 MG tablet Take 1 tablet (20 mg total) by mouth 2 (two) times daily.   rosuvastatin (CRESTOR) 10 MG tablet Take 1 tablet by mouth once daily   VENTOLIN HFA 108 (90 Base) MCG/ACT inhaler Inhale 2 puffs into the lungs every 6 (six) hours as needed for wheezing or shortness of breath.   [DISCONTINUED] benzonatate (TESSALON) 100 MG capsule Take by mouth 3 (three) times daily as needed for cough.   QUEtiapine (SEROQUEL) 25 MG tablet Take 2-3 tablets (50-75 mg total) by mouth 2 (two) times daily. Take up to 50 mg (two tablets) in the morning and up to 75 mg ( 3 tablets) at night.   Tiotropium Bromide Monohydrate (SPIRIVA RESPIMAT) 2.5 MCG/ACT AERS Inhale 2 puffs into the lungs daily.   No facility-administered medications prior to visit.    Review of Systems  Genitourinary:  Positive for dyspareunia and vaginal pain.       Objective    BP 134/81  Pulse 85   Temp 98.2 F (36.8 C) (Oral)   Ht 5' 5.51" (1.664 m)   Wt 148 lb 6.4 oz (67.3 kg)   SpO2 98%   BMI 24.31 kg/m    Physical Exam Vitals reviewed.  Constitutional:      General: She is awake.     Appearance: Normal appearance. She is well-developed and well-groomed.  HENT:     Head: Normocephalic and atraumatic.  Eyes:     General: Lids are normal. Gaze aligned appropriately.     Extraocular Movements: Extraocular movements intact.     Conjunctiva/sclera: Conjunctivae normal.  Cardiovascular:     Rate and Rhythm: Normal rate and regular rhythm.     Heart sounds: Normal heart sounds.  Pulmonary:     Effort: Pulmonary effort is normal.     Breath sounds: Normal breath sounds. No stridor or transmitted upper airway sounds. No  decreased breath sounds, wheezing, rhonchi or rales.  Musculoskeletal:     Cervical back: Normal range of motion.  Neurological:     General: No focal deficit present.     Mental Status: She is alert and oriented to person, place, and time.     GCS: GCS eye subscore is 4. GCS verbal subscore is 5. GCS motor subscore is 6.  Psychiatric:        Attention and Perception: Attention and perception normal.        Mood and Affect: Mood and affect normal.        Speech: Speech normal.        Behavior: Behavior normal. Behavior is cooperative.       Results for orders placed or performed in visit on 12/18/21  WET PREP FOR [place], YEAST, CLUE   Specimen: Sterile Swab   Sterile Swab  Result Value Ref Range   Trichomonas Exam Negative Negative   Yeast Exam Negative Negative   Clue Cell Exam Positive (A) Negative  Urinalysis, Routine w reflex microscopic  Result Value Ref Range   Specific Gravity, UA 1.010 1.005 - 1.030   pH, UA 5.5 5.0 - 7.5   Color, UA Yellow Yellow   Appearance Ur Clear Clear   Leukocytes,UA Negative Negative   Protein,UA Negative Negative/Trace   Glucose, UA Negative Negative   Ketones, UA Negative Negative   RBC, UA Negative Negative   Bilirubin, UA Negative Negative   Urobilinogen, Ur 0.2 0.2 - 1.0 mg/dL   Nitrite, UA Negative Negative    Assessment & Plan      No follow-ups on file.      Problem List Items Addressed This Visit       Respiratory   COPD mixed type ([place])    Chronic, historic condition Suspect potential exacerbation at this time due to persistent coughing and congestion She is using Spiriva, Symbicort and Ventolin for management and seems to be tolerating well Will increase Tessalon perls to 200 mg PO BID PRN from 100 mg strength and add Montelukast 10 mg PO QD to assist with allergies  Will also add Prednisone 40 mg PO QD x 5 day burst to assist with coughing and wheezing  Do not recommend Tussionex cough medicine despite patient request  as I skeptical it will provide reduction to coughing  Follow up as needed for persistent or progressing symptoms       Relevant Medications   benzonatate (TESSALON) 200 MG capsule   montelukast (SINGULAIR) 10 MG tablet   predniSONE (DELTASONE) 20 MG tablet  Genitourinary   Vaginal dryness    Unsure of chronicity Results of wet prep revealed clue cells so will send in script for Flagyl 500 mg PO BID  7 days to treat for BV Suspect she may have some age related  vaginal atrophy as well so will add Estrace vaginal cream to assist with this Recommend using lubricants during intercourse Will refer to Ob/Gyn for persistent or progressing symptoms       Relevant Medications   estradiol (ESTRACE) 0.1 MG/GM vaginal cream   metroNIDAZOLE (FLAGYL) 500 MG tablet   Other Relevant Orders   WET PREP FOR TRICH, YEAST, CLUE (Completed)   Urinalysis, Routine w reflex microscopic (Completed)     Other   Cough in adult - Primary    Acute, recurrent See COPD A&P for management plan       Relevant Medications   benzonatate (TESSALON) 200 MG capsule   montelukast (SINGULAIR) 10 MG tablet   predniSONE (DELTASONE) 20 MG tablet   Other Visit Diagnoses     Bacterial vaginosis       Relevant Medications   metroNIDAZOLE (FLAGYL) 500 MG tablet        No follow-ups on file.   I, Trayon Krantz E Dvante Hands, PA-C, have reviewed all documentation for this visit. The documentation on 12/19/21 for the exam, diagnosis, procedures, and orders are all accurate and complete.   Jacquelin Hawking, MHS, PA-C [facility]

## 2021-12-18 NOTE — Telephone Encounter (Signed)
Pt call for clarification of medication prescribed today. Rn was able to clarify rx's for pt.

## 2021-12-19 DIAGNOSIS — R059 Cough, unspecified: Secondary | ICD-10-CM | POA: Insufficient documentation

## 2021-12-19 DIAGNOSIS — N898 Other specified noninflammatory disorders of vagina: Secondary | ICD-10-CM | POA: Insufficient documentation

## 2021-12-19 NOTE — Assessment & Plan Note (Signed)
Acute, recurrent See COPD A&P for management plan

## 2021-12-19 NOTE — Assessment & Plan Note (Signed)
Chronic, historic condition Suspect potential exacerbation at this time due to persistent coughing and congestion She is using Spiriva, Symbicort and Ventolin for management and seems to be tolerating well Will increase Tessalon perls to 200 mg PO BID PRN from 100 mg strength and add Montelukast 10 mg PO QD to assist with allergies  Will also add Prednisone 40 mg PO QD x 5 day burst to assist with coughing and wheezing  Do not recommend Tussionex cough medicine despite patient request as I skeptical it will provide reduction to coughing  Follow up as needed for persistent or progressing symptoms

## 2021-12-19 NOTE — Assessment & Plan Note (Signed)
Unsure of chronicity Results of wet prep revealed clue cells so will send in script for Flagyl 500 mg PO BID  7 days to treat for BV Suspect she may have some age related  vaginal atrophy as well so will add Estrace vaginal cream to assist with this Recommend using lubricants during intercourse Will refer to Ob/Gyn for persistent or progressing symptoms

## 2021-12-24 NOTE — Congregational Nurse Program (Signed)
  Dept: [phone]   Congregational Nurse Program Note  Date of Encounter: 12/24/2021 Client to [facility] clinic for vital sign check, support and to discuss a medications that was prescribed by her PCP last week. BP 130/74 (BP Location: Left Arm, Patient Position: Sitting, Cuff Size: Normal)   Pulse 78   SpO2 99% . Estradiol cream and flagyl were reviewed with client. Questions answered and suggestions for over the counter aides for vaginal dryness were discussed.  Client is currently in a stable living situation and appears very well and happy. Support given.   Past Medical History: Past Medical History:  Diagnosis Date   Allergy    Anxiety    Arthritis    Depression    GERD (gastroesophageal reflux disease)    Glaucoma    Heart murmur    Kidney stones    Migraines    Panic attack    Panic attack    Urticaria 12/01/2018    Encounter Details:  CNP Questionnaire - 12/24/21 1200       Questionnaire   Ask client: Do you give verbal consent for me to treat you today? Yes    Student Assistance N/A    Location Patient [place]    Visit Setting with Client Organization    Patient Status Unknown   currenlty living with a friend in a camper   Insurance Medicaid   Well care   Insurance/Financial Assistance Referral N/A    Medication N/A    Medical Provider Yes   [facility]   Screening Referrals Made N/A    Medical Referrals Made N/A    Medical Appointment Made N/A    Recently w/o PCP, now 1st time PCP visit completed due to CNs referral or appointment made [name]   has food stamps, also uses food pantry at the [place]   has friends that provide trnasportation, or the [employer] bus   Housing/Utilities No permanent housing   currenlty living in a car on a  friends property   Interpersonal Safety N/A    Interventions Advocate/Support;Reviewed Medications    Abnormal to Normal Screening Since Last CN Visit N/A     Screenings CN Performed Blood Pressure;Pulse Ox    Sent Client to Lab for: N/A    Did client attend any of the following based off CNs referral or appointments made? N/A    ED Visit Averted N/A    Life-Saving Intervention Made N/A

## 2021-12-31 DIAGNOSIS — Z419 Encounter for procedure for purposes other than remedying health state, unspecified: Secondary | ICD-10-CM | POA: Diagnosis not present

## 2022-01-03 ENCOUNTER — Telehealth: Payer: Medicaid Other | Admitting: Nurse Practitioner

## 2022-01-03 DIAGNOSIS — J40 Bronchitis, not specified as acute or chronic: Secondary | ICD-10-CM

## 2022-01-03 DIAGNOSIS — R059 Cough, unspecified: Secondary | ICD-10-CM

## 2022-01-03 MED ORDER — BENZONATATE 200 MG PO CAPS: 200.0000 mg | ORAL_CAPSULE | Freq: Two times a day (BID) | ORAL | 0 refills | Status: DC | PRN

## 2022-01-03 MED ORDER — PROMETHAZINE-DM 6.25-15 MG/5ML PO SYRP: 5.0000 mL | ORAL_SOLUTION | Freq: Four times a day (QID) | ORAL | 0 refills | Status: DC | PRN

## 2022-01-03 MED ORDER — DOXYCYCLINE HYCLATE 100 MG PO TABS: 100.0000 mg | ORAL_TABLET | Freq: Two times a day (BID) | ORAL | 0 refills | Status: DC

## 2022-01-03 NOTE — Progress Notes (Signed)

## 2022-01-03 NOTE — Addendum Note (Signed)
Addended by: Chevis Pretty on: 01/03/2022 04:54 PM   Modules accepted: Orders

## 2022-01-21 ENCOUNTER — Ambulatory Visit: Payer: Medicaid Other | Admitting: Nurse Practitioner

## 2022-01-28 ENCOUNTER — Other Ambulatory Visit: Payer: Self-pay | Admitting: Physician Assistant

## 2022-01-28 DIAGNOSIS — J449 Chronic obstructive pulmonary disease, unspecified: Secondary | ICD-10-CM

## 2022-01-30 DIAGNOSIS — Z419 Encounter for procedure for purposes other than remedying health state, unspecified: Secondary | ICD-10-CM | POA: Diagnosis not present

## 2022-02-02 ENCOUNTER — Encounter: Payer: Self-pay | Admitting: *Deleted

## 2022-02-02 ENCOUNTER — Other Ambulatory Visit: Payer: Medicaid Other | Admitting: *Deleted

## 2022-02-02 ENCOUNTER — Other Ambulatory Visit: Payer: Self-pay | Admitting: Physician Assistant

## 2022-02-02 ENCOUNTER — Encounter: Payer: Self-pay | Admitting: Nurse Practitioner

## 2022-02-02 DIAGNOSIS — J449 Chronic obstructive pulmonary disease, unspecified: Secondary | ICD-10-CM

## 2022-02-02 NOTE — Patient Outreach (Signed)
Medicaid Managed Care   Nurse Care Manager Note  02/02/2022 Name:  MCKENNA GAMM MRN:  [id number] DOB:  Jul 03, 1960  [name] is an 61 y.o. year old female who is a primary patient of Larae Grooms, NP.  The [facility] Managed Care Coordination team was consulted for assistance with:    Patient agreed to services and verbal consent obtained.  Ms. Chenette was given information about Medicaid Managed Care Coordination team services today. [name] Patient agreed to services and verbal consent obtained.  Engaged with patient by telephone for follow up visit in response to provider referral for case management and/or care coordination services.   Assessments/Interventions:  Review of past medical history, allergies, medications, health status, including review of consultants reports, laboratory and other test data, was performed as part of comprehensive evaluation and provision of chronic care management services.  SDOH (Social Determinants of Health) assessments and interventions performed: SDOH Interventions    Flowsheet Row Patient Outreach Telephone from 02/02/2022 in [place] [facility] Patient Outreach Telephone from 12/03/2021 in [facility] Community Care Coordination Office Visit from 10/21/2021 in [facility] Patient Outreach Telephone from 08/05/2021 in [facility] Care Coordination Telephone from 06/24/2021 in [facility] Coord[facility] Patient Outreach Telephone from 06/20/2021 in [facility] Coord[facility]  SDOH Interventions        Food Insecurity Interventions Intervention Not Indicated Intervention Not Indicated  [Patient reports no food insecurity at this time] -- Intervention Not Indicated Other (Comment)  [Per patient request emailed [employer] list.] Other (Comment)  Conservator, museum/gallery Guide referral for local food pantries]  Housing Interventions -- -- --  -- -- Intervention Not Indicated  Transportation Interventions Intervention Not Indicated -- -- Intervention Not Indicated -- --  Depression Interventions/Treatment  -- -- Medication -- -- --       Care Plan  Allergies  Allergen Reactions   Bee Venom Anaphylaxis   Ibuprofen Anaphylaxis   Sulfa Antibiotics Anaphylaxis   Tomato Anaphylaxis and Nausea And Vomiting   Hydroxyzine Nausea Only    Medications Reviewed Today     Reviewed by Heidi Dach, RN (Registered Nurse) on 02/02/22 at 1545  Med List Status: <None>   Medication Order Taking? Sig Documenting Provider Last Dose Status Informant  Aspirin-Acetaminophen (GOODYS BODY PAIN PO) [id number] Yes Take by mouth. [provider] Taking Active   benzonatate (TESSALON) 100 MG capsule [id number] Yes TAKE 2 CAPSULES BY MOUTH THREE TIMES DAILY AS NEEDED FOR COUGH Larae Grooms, NP Taking Active   benzonatate (TESSALON) 200 MG capsule [id number] No Take 1 capsule (200 mg total) by mouth 2 (two) times daily as needed for cough.  Patient not taking: Reported on 02/02/2022   Bennie Pierini, FNP Not Taking Active   budesonide-formoterol [facility]) 160-4.5 MCG/ACT inhaler [id number] Yes Inhale 2 puffs into the lungs 2 (two) times daily. Mecum, Erin E, PA-C Taking Active   cyclobenzaprine (FLEXERIL) 10 MG tablet [id number] Yes Take 1 tablet by mouth three times daily as needed for muscle spasm Larae Grooms, NP Taking Active   doxycycline (VIBRA-TABS) 100 MG tablet [id number] No Take 1 tablet (100 mg total) by mouth 2 (two) times daily. 1 po bid  Patient not taking: Reported on 02/02/2022   Bennie Pierini, FNP Not Taking Active            Med Note Ardelia Mems, Rhylee Pucillo A   Mon Feb 02, 2022  3:39 PM) completed  DULoxetine (  CYMBALTA) 20 MG capsule [id number] Yes Take 1 capsule (20 mg total) by mouth daily. Mecum, Erin E, PA-C Taking Active   estradiol (ESTRACE) 0.1 MG/GM vaginal cream [id number] No Place 1 Applicatorful  vaginally at bedtime.  Patient not taking: Reported on 02/02/2022   Mecum, Oswaldo Conroy, PA-C Not Taking Active   gabapentin (NEURONTIN) 300 MG capsule [id number] Yes Take 1 capsule (300 mg total) by mouth 4 (four) times daily. Mecum, Erin E, PA-C Taking Active   metoprolol tartrate (LOPRESSOR) 25 MG tablet [id number] Yes Take 1 tablet (25 mg total) by mouth 2 (two) times daily. Mecum, Erin E, PA-C Taking Active   metroNIDAZOLE (FLAGYL) 500 MG tablet [id number] No Take 1 tablet (500 mg total) by mouth 2 (two) times daily.  Patient not taking: Reported on 02/02/2022   Mecum, Oswaldo Conroy, PA-C Not Taking Active            Med Note (Angus Amini A   Mon Feb 02, 2022  3:41 PM) completed  montelukast (SINGULAIR) 10 MG tablet [id number] Yes Take 1 tablet (10 mg total) by mouth at bedtime. Mecum, Erin E, PA-C Taking Active   pantoprazole (PROTONIX) 20 MG tablet [id number] Yes Take 1 tablet (20 mg total) by mouth 2 (two) times daily. Mecum, Erin E, PA-C Taking Active   promethazine-dextromethorphan (PROMETHAZINE-DM) 6.25-15 MG/5ML syrup [id number] No Take 5 mLs by mouth 4 (four) times daily as needed for cough.  Patient not taking: Reported on 02/02/2022   Bennie Pierini, FNP Not Taking Active            Med Note (Dory Demont A   Mon Feb 02, 2022  3:44 PM) Ran out  QUEtiapine (SEROQUEL) 25 MG tablet [id number] Yes Take 2-3 tablets (50-75 mg total) by mouth 2 (two) times daily. Take up to 50 mg (two tablets) in the morning and up to 75 mg ( 3 tablets) at night. Mecum, Oswaldo Conroy, PA-C Taking Active            Med Note (Aneta Hendershott A   Wed Dec 03, 2021  4:08 PM) Taking PRN  rosuvastatin (CRESTOR) 10 MG tablet [id number] Yes Take 1 tablet by mouth once daily Larae Grooms, NP Taking Active   Tiotropium Bromide Monohydrate (SPIRIVA RESPIMAT) 2.5 MCG/ACT AERS [id number] Yes Inhale 2 puffs into the lungs daily. Mecum, Oswaldo Conroy, PA-C Taking Active   VENTOLIN HFA 108 (90 Base) MCG/ACT inhaler [id number] Yes Inhale 2 puffs into  the lungs every 6 (six) hours as needed for wheezing or shortness of breath. Mecum, Oswaldo Conroy, PA-C Taking Active             Patient Active Problem List   Diagnosis Date Noted   Cough in adult 12/19/2021   Vaginal dryness 12/19/2021   Gastroesophageal reflux disease 10/21/2021   Tachycardia 10/21/2021   Mitral valve prolapse 10/21/2021   Other chronic pain 12/25/2020   Allergic rhinitis 05/26/2019   COPD mixed type (HCC) 12/01/2018   Cigarette smoker 06/08/2018   Arthritis 02/19/2018   Hemorrhoids 02/18/2018   Reactive airway disease 02/18/2018   Migraines 12/12/2017   Deviated septum 12/03/2017   Heart murmur 12/03/2017   Depression 12/03/2017   Panic disorder     Conditions to be addressed/monitored per PCP order:  HLD and COPD  Care Plan : RN Care Manager Plan of Care  Updates made by Heidi Dach, RN since 02/02/2022 12:00 AM     Problem: Chronic health managment needs related to COPD  Long-Range Goal: Development of Plan of Care to address health managment needs related to COPD   Start Date: 01/27/2021  Expected End Date: 04/30/2022  Priority: High  Note:   Current Barriers:  Chronic Disease Management support and education needs related to HLD and COPD Ms. Archibald was approved for disability, continues to have [employer]. She continues to have a cough which is relieved with Tessalon Pearle. Has not followed up with Pulmonology.  RNCM Clinical Goal(s):  Patient will verbalize understanding of plan for management of HLD as evidenced by patient verbalization and self monitoring activity take all medications exactly as prescribed and will call provider for medication related questions as evidenced by documentation in EMR    attend all scheduled medical appointments: reschedule with PCP and Pulmonology as evidenced by provider documentation in EMR        continue to work with RN Care Manager and/or Social Worker to address care management and care coordination needs  related to HLD as evidenced by adherence to CM Team Scheduled appointments     through collaboration with Medical illustrator, provider, and care team.   Interventions: Inter-disciplinary care team collaboration (see longitudinal plan of care) Evaluation of current treatment plan related to  self management and patient's adherence to plan as established by provider Provided therapeutic listening   COPD Interventions:  (Status:  Goal on track:  NO.) Long Term Goal Provided education about and advised patient to utilize infection prevention strategies to reduce risk of respiratory infection Assessed social determinant of health barriers Congratulated patient on cutting back on smoking from 1pk/day to 1/3 pk/day, encouraged her to continue working on smoking cessation Advised patient to schedule with Pulmonology, previously seen by [facility]-patient will call to schedule Advised patient to discuss referral to Asthma and Allergy with PCP   Hyperlipidemia:  (Status: Condition stable. Not addressed this visit.) Long Term Goal  Lab Results  Component Value Date   CHOL 137 02/14/2021   HDL 22 (L) 02/14/2021   LDLCALC 46 02/14/2021   TRIG 470 (H) 02/14/2021   CHOLHDL 6.2 (H) 02/14/2021     Medication review performed; medication list updated in electronic medical record.  Counseled on importance of regular laboratory monitoring as prescribed; Provided HLD educational materials; Reviewed exercise goals and target of 150 minutes per week; Assessed social determinant of health barriers;  Encouraged patient to discuss any concerns or questions with provider   Patient Goals/Self-Care Activities: Take medications as prescribed   Attend all scheduled provider appointments Call pharmacy for medication refills 3-7 days in advance of running out of medications Attend church or other social activities Perform all self care activities independently  Call provider office for new concerns or  questions  - adhere to prescribed diet: heart healthy - develop an exercise routine       Follow Up:  Patient agrees to Care Plan and Follow-up.  Plan: The Managed Medicaid care management team will reach out to the patient again over the next 60 days.  Date/time of next scheduled RN care management/care coordination outreach:  04/06/22 @ 3:30pm  Estanislado Emms RN, BSN [place]  [facility] RN Care Coordinator

## 2022-02-02 NOTE — Patient Instructions (Signed)
Visit Information  Karen Foster was given information about Medicaid Managed Care team care coordination services as a part of their [facility] Medicaid benefit. Karen Foster verbally consented to engagement with the [facility] Managed Care team.   If you are experiencing a medical emergency, please call 911 or report to your local emergency department or urgent care.   If you have a non-emergency medical problem during routine business hours, please contact your provider's office and ask to speak with a nurse.   For questions related to your [facility] health plan, please call: [phone] or go here:[email or web address]  If you would like to schedule transportation through your [facility] plan, please call the following number at least 2 days in advance of your appointment: [phone].  You can also use the MTM portal or MTM mobile app to manage your rides. For the portal, please go to [email or web address]  Call the [facility] Crisis Line at [phone], at any time, 24 hours a day, 7 days a week. If you are in danger or need immediate medical attention call 911.  If you would like help to quit smoking, call 1-800-QUIT-NOW ((223) 755-5367) OR Espaol: 1-855-Djelo-Ya (6-599-357-0177) o para ms informacin haga clic aqu or Text READY to 939-030 to register via text  Karen Foster,   Please see education materials related to cough provided by MyChart link.  Patient verbalizes understanding of instructions and care plan provided today and agrees to view in MyChart. Active MyChart status and patient understanding of how to access instructions and care plan via MyChart confirmed with patient.     Telephone follow up appointment with Managed Medicaid care management team member scheduled for:04/06/22 @ 3:30pm  Estanislado Emms RN, BSN [place]  [facility] RN Care Coordinator   Following is a copy of your plan of care:  Care Plan : RN Care Manager Plan of  Care  Updates made by Heidi Dach, RN since 02/02/2022 12:00 AM     Problem: Chronic health managment needs related to COPD      Long-Range Goal: Development of Plan of Care to address health managment needs related to COPD   Start Date: 01/27/2021  Expected End Date: 04/30/2022  Priority: High  Note:   Current Barriers:  Chronic Disease Management support and education needs related to HLD and COPD Karen Foster was approved for disability, continues to have [employer]. She continues to have a cough which is relieved with Tessalon Pearle. Has not followed up with Pulmonology.  RNCM Clinical Goal(s):  Patient will verbalize understanding of plan for management of HLD as evidenced by patient verbalization and self monitoring activity take all medications exactly as prescribed and will call provider for medication related questions as evidenced by documentation in EMR    attend all scheduled medical appointments: reschedule with PCP and Pulmonology as evidenced by provider documentation in EMR        continue to work with RN Care Manager and/or Social Worker to address care management and care coordination needs related to HLD as evidenced by adherence to CM Team Scheduled appointments     through collaboration with RN Care manager, provider, and care team.   Interventions: Inter-disciplinary care team collaboration (see longitudinal plan of care) Evaluation of current treatment plan related to  self management and patient's adherence to plan as established by provider Provided therapeutic listening   COPD Interventions:  (Status:  Goal on track:  NO.) Long Term Goal Provided education about and advised patient to utilize infection  prevention strategies to reduce risk of respiratory infection Assessed social determinant of health barriers Congratulated patient on cutting back on smoking from 1pk/day to 1/3 pk/day, encouraged her to continue working on smoking cessation Advised patient to  schedule with Pulmonology, previously seen by [facility]-patient will call to schedule Advised patient to discuss referral to Asthma and Allergy with PCP   Hyperlipidemia:  (Status: Condition stable. Not addressed this visit.) Long Term Goal  Lab Results  Component Value Date   CHOL 137 02/14/2021   HDL 22 (L) 02/14/2021   LDLCALC 46 02/14/2021   TRIG 470 (H) 02/14/2021   CHOLHDL 6.2 (H) 02/14/2021     Medication review performed; medication list updated in electronic medical record.  Counseled on importance of regular laboratory monitoring as prescribed; Provided HLD educational materials; Reviewed exercise goals and target of 150 minutes per week; Assessed social determinant of health barriers;  Encouraged patient to discuss any concerns or questions with provider   Patient Goals/Self-Care Activities: Take medications as prescribed   Attend all scheduled provider appointments Call pharmacy for medication refills 3-7 days in advance of running out of medications Attend church or other social activities Perform all self care activities independently  Call provider office for new concerns or questions  - adhere to prescribed diet: heart healthy - develop an exercise routine

## 2022-02-03 NOTE — Telephone Encounter (Signed)
Unable to refill per protocol, request too soon. Last refill 01/28/22. Will refuse duplicate request.  Requested Prescriptions  Pending Prescriptions Disp Refills   benzonatate (TESSALON) 100 MG capsule [Pharmacy Med Name: Benzonatate 100 MG Oral Capsule] 90 capsule 0    Sig: TAKE 2 CAPSULES BY MOUTH THREE TIMES DAILY AS NEEDED FOR COUGH     Ear, Nose, and Throat:  Antitussives/Expectorants Passed - 02/02/2022  4:09 PM      Passed - Valid encounter within last 12 months    Recent Outpatient Visits           1 month ago Cough in adult   [employer], Erin E, PA-C   3 months ago COPD mixed type (HCC)   [facility] Mecum, Erin E, PA-C   8 months ago Severe episode of recurrent major depressive disorder, without psychotic features (HCC)   [facility] Larae Grooms, NP   8 months ago COPD mixed type [facility])   [facility] Larae Grooms, NP   9 months ago COPD mixed type [facility])   [facility] Larae Grooms, NP

## 2022-02-10 ENCOUNTER — Telehealth: Payer: Medicaid Other | Admitting: Family Medicine

## 2022-02-10 DIAGNOSIS — R053 Chronic cough: Secondary | ICD-10-CM

## 2022-02-10 DIAGNOSIS — J020 Streptococcal pharyngitis: Secondary | ICD-10-CM | POA: Diagnosis not present

## 2022-02-10 DIAGNOSIS — J029 Acute pharyngitis, unspecified: Secondary | ICD-10-CM | POA: Diagnosis not present

## 2022-02-10 DIAGNOSIS — Z1152 Encounter for screening for COVID-19: Secondary | ICD-10-CM | POA: Diagnosis not present

## 2022-02-10 DIAGNOSIS — R0602 Shortness of breath: Secondary | ICD-10-CM | POA: Diagnosis not present

## 2022-02-10 DIAGNOSIS — R059 Cough, unspecified: Secondary | ICD-10-CM | POA: Diagnosis not present

## 2022-02-10 DIAGNOSIS — Z720 Tobacco use: Secondary | ICD-10-CM | POA: Diagnosis not present

## 2022-02-10 DIAGNOSIS — J209 Acute bronchitis, unspecified: Secondary | ICD-10-CM | POA: Diagnosis not present

## 2022-02-10 DIAGNOSIS — R0981 Nasal congestion: Secondary | ICD-10-CM | POA: Diagnosis not present

## 2022-02-10 NOTE — Progress Notes (Signed)
[place]   Needs in person- has had first line treatment in Nov for similar symptoms.

## 2022-02-14 ENCOUNTER — Other Ambulatory Visit: Payer: Self-pay | Admitting: Physician Assistant

## 2022-02-14 ENCOUNTER — Telehealth: Payer: Self-pay | Admitting: Physician Assistant

## 2022-02-14 DIAGNOSIS — J449 Chronic obstructive pulmonary disease, unspecified: Secondary | ICD-10-CM

## 2022-02-19 NOTE — Telephone Encounter (Signed)
Pt scheduled 12/26 @ 11:20

## 2022-02-19 NOTE — Telephone Encounter (Signed)
Pt wants to speak to clinic, she has questions about why her medications have been denied. Please advise and call back,   Best contact: [phone]

## 2022-02-19 NOTE — Telephone Encounter (Signed)
Medications were denied because patient needs an in office appointment. Patient was advised of this at her recent e-visit on 02/10/22. Please call to schedule.

## 2022-02-24 ENCOUNTER — Ambulatory Visit: Payer: Medicaid Other | Admitting: Nurse Practitioner

## 2022-03-02 DIAGNOSIS — Z419 Encounter for procedure for purposes other than remedying health state, unspecified: Secondary | ICD-10-CM | POA: Diagnosis not present

## 2022-03-24 ENCOUNTER — Encounter: Payer: Self-pay | Admitting: Nurse Practitioner

## 2022-03-25 ENCOUNTER — Other Ambulatory Visit: Payer: Self-pay | Admitting: Nurse Practitioner

## 2022-03-25 ENCOUNTER — Other Ambulatory Visit: Payer: Self-pay | Admitting: Physician Assistant

## 2022-03-25 DIAGNOSIS — J449 Chronic obstructive pulmonary disease, unspecified: Secondary | ICD-10-CM

## 2022-03-25 NOTE — Telephone Encounter (Signed)
Requested medication (s) are due for refill today: yes  Requested medication (s) are on the active medication list: yes  Last refill:  01/28/22 #20 0 refills  Future visit scheduled: yes in 1 week  Notes to clinic: no refills remain. Do you want to refill Rx? Patient requesting refills.      Requested Prescriptions  Pending Prescriptions Disp Refills   benzonatate (TESSALON) 100 MG capsule 20 capsule 0     Ear, Nose, and Throat:  Antitussives/Expectorants Passed - 03/25/2022 12:33 PM      Passed - Valid encounter within last 12 months    Recent Outpatient Visits           3 months ago Cough in adult   [place], Erin E, PA-C   5 months ago COPD mixed type [facility])   [facility] Mecum, Erin E, PA-C   10 months ago Severe episode of recurrent major depressive disorder, without psychotic features ([place])   [place], Karen, NP   10 months ago COPD mixed type [facility])   [place], NP   11 months ago COPD mixed type [facility])   [place] Jon Billings, NP       Future Appointments             In 1 week Jon Billings, NP [place]

## 2022-03-25 NOTE — Telephone Encounter (Signed)
Called pharmacy to check on status of medication. No Rx sent for refills for tessalon

## 2022-03-25 NOTE — Telephone Encounter (Signed)
Medication Refill - Medication: Tessalon Pearls   benzonatate (TESSALON) 100 MG capsule   Has the patient contacted their pharmacy? Yes.   (Agent: If no, request that the patient contact the pharmacy for the refill. If patient does not wish to contact the pharmacy document the reason why and proceed with request.) (Agent: If yes, when and what did the pharmacy advise?)  Preferred Pharmacy (with phone number or street name):  [place], [place] [address]  Phone: [phone] Fax: [phone]  Hours: Not open 24 hours   Has the patient been seen for an appointment in the last year OR does the patient have an upcoming appointment? Yes.    Agent: Please be advised that RX refills may take up to 3 business days. We ask that you follow-up with your pharmacy.

## 2022-03-31 ENCOUNTER — Telehealth: Payer: Self-pay | Admitting: Nurse Practitioner

## 2022-03-31 NOTE — Telephone Encounter (Signed)
Copied from [place] [id number]. Topic: Appointment Scheduling - Scheduling Inquiry for Clinic >> Mar 31, 2022 11:36 AM Tiffany B wrote: Reason for CRM: Patient is in [facility] and would prefer to come in tomorrow, any work ins or cancellations please advise patient directly today. If not patient will keep appointment for Thursday.Patient states appointment is for a med check but is experiencing cough and sore throat

## 2022-04-02 ENCOUNTER — Ambulatory Visit (INDEPENDENT_AMBULATORY_CARE_PROVIDER_SITE_OTHER): Payer: Medicaid Other | Admitting: Nurse Practitioner

## 2022-04-02 ENCOUNTER — Encounter: Payer: Self-pay | Admitting: Nurse Practitioner

## 2022-04-02 VITALS — BP 107/62 | HR 108 | Temp 98.2°F | Wt 143.7 lb

## 2022-04-02 DIAGNOSIS — E782 Mixed hyperlipidemia: Secondary | ICD-10-CM | POA: Diagnosis not present

## 2022-04-02 DIAGNOSIS — R059 Cough, unspecified: Secondary | ICD-10-CM | POA: Diagnosis not present

## 2022-04-02 DIAGNOSIS — F332 Major depressive disorder, recurrent severe without psychotic features: Secondary | ICD-10-CM | POA: Diagnosis not present

## 2022-04-02 DIAGNOSIS — Z419 Encounter for procedure for purposes other than remedying health state, unspecified: Secondary | ICD-10-CM | POA: Diagnosis not present

## 2022-04-02 DIAGNOSIS — J449 Chronic obstructive pulmonary disease, unspecified: Secondary | ICD-10-CM | POA: Diagnosis not present

## 2022-04-02 MED ORDER — ROSUVASTATIN CALCIUM 10 MG PO TABS: 10.0000 mg | ORAL_TABLET | Freq: Every day | ORAL | 1 refills | Status: DC

## 2022-04-02 MED ORDER — VENTOLIN HFA 108 (90 BASE) MCG/ACT IN AERS: 2.0000 | INHALATION_SPRAY | Freq: Four times a day (QID) | RESPIRATORY_TRACT | 1 refills | Status: DC | PRN

## 2022-04-02 MED ORDER — BUDESONIDE-FORMOTEROL FUMARATE 160-4.5 MCG/ACT IN AERO: 2.0000 | INHALATION_SPRAY | Freq: Two times a day (BID) | RESPIRATORY_TRACT | 3 refills | Status: DC

## 2022-04-02 MED ORDER — SPIRIVA RESPIMAT 2.5 MCG/ACT IN AERS: 2.0000 | INHALATION_SPRAY | Freq: Every day | RESPIRATORY_TRACT | 1 refills | Status: DC

## 2022-04-02 MED ORDER — BENZONATATE 200 MG PO CAPS: 200.0000 mg | ORAL_CAPSULE | Freq: Two times a day (BID) | ORAL | 0 refills | Status: DC | PRN

## 2022-04-02 NOTE — Assessment & Plan Note (Signed)
Chronic. Not well controlled.  Coughing daily.  Endorses using Spiriva and symbicort as prescribed.  Refilled at visit today.  Referral placed for patient to see Pulmonology due to cough.  Follow up in 3 months.  Call sooner if concerns arise.

## 2022-04-02 NOTE — Progress Notes (Deleted)
   There were no vitals taken for this visit.   Subjective:    Patient ID: Karen Foster, female    DOB: 1961-01-31, 62 y.o.   MRN: [id number]  HPI: Karen Foster is a 62 y.o. female  No chief complaint on file.  UPPER RESPIRATORY TRACT INFECTION Worst symptom: Fever: {Blank single:19197::"yes","no"} Cough: {Blank single:19197::"yes","no"} Shortness of breath: {Blank single:19197::"yes","no"} Wheezing: {Blank single:19197::"yes","no"} Chest pain: {Blank single:19197::"yes","no","yes, with cough"} Chest tightness: {Blank single:19197::"yes","no"} Chest congestion: {Blank single:19197::"yes","no"} Nasal congestion: {Blank single:19197::"yes","no"} Runny nose: {Blank single:19197::"yes","no"} Post nasal drip: {Blank single:19197::"yes","no"} Sneezing: {Blank single:19197::"yes","no"} Sore throat: {Blank single:19197::"yes","no"} Swollen glands: {Blank single:19197::"yes","no"} Sinus pressure: {Blank single:19197::"yes","no"} Headache: {Blank single:19197::"yes","no"} Face pain: {Blank single:19197::"yes","no"} Toothache: {Blank single:19197::"yes","no"} Ear pain: {Blank single:19197::"yes","no"} {Blank single:19197::""right","left", "bilateral"} Ear pressure: {Blank single:19197::"yes","no"} {Blank single:19197::""right","left", "bilateral"} Eyes red/itching:{Blank single:19197::"yes","no"} Eye drainage/crusting: {Blank single:19197::"yes","no"}  Vomiting: {Blank single:19197::"yes","no"} Rash: {Blank single:19197::"yes","no"} Fatigue: {Blank single:19197::"yes","no"} Sick contacts: {Blank single:19197::"yes","no"} Strep contacts: {Blank single:19197::"yes","no"}  Context: {Blank multiple:19196::"better","worse","stable","fluctuating"} Recurrent sinusitis: {Blank single:19197::"yes","no"} Relief with OTC cold/cough medications: {Blank single:19197::"yes","no"}  Treatments attempted: {Blank multiple:19196::"none","cold/sinus","mucinex","anti-histamine","pseudoephedrine","cough  syrup","antibiotics"}   Relevant past medical, surgical, family and social history reviewed and updated as indicated. Interim medical history since our last visit reviewed. Allergies and medications reviewed and updated.  Review of Systems  Per HPI unless specifically indicated above     Objective:    There were no vitals taken for this visit.  Wt Readings from Last 3 Encounters:  12/18/21 148 lb 6.4 oz (67.3 kg)  10/21/21 147 lb 11.2 oz (67 kg)  06/09/21 168 lb 4 oz (76.3 kg)    Physical Exam  Results for orders placed or performed in visit on 12/18/21  WET PREP FOR [place], YEAST, CLUE   Specimen: Sterile Swab   Sterile Swab  Result Value Ref Range   Trichomonas Exam Negative Negative   Yeast Exam Negative Negative   Clue Cell Exam Positive (A) Negative  Urinalysis, Routine w reflex microscopic  Result Value Ref Range   Specific Gravity, UA 1.010 1.005 - 1.030   pH, UA 5.5 5.0 - 7.5   Color, UA Yellow Yellow   Appearance Ur Clear Clear   Leukocytes,UA Negative Negative   Protein,UA Negative Negative/Trace   Glucose, UA Negative Negative   Ketones, UA Negative Negative   RBC, UA Negative Negative   Bilirubin, UA Negative Negative   Urobilinogen, Ur 0.2 0.2 - 1.0 mg/dL   Nitrite, UA Negative Negative      Assessment & Plan:   Problem List Items Addressed This Visit   None    Follow up plan: No follow-ups on file.

## 2022-04-02 NOTE — Assessment & Plan Note (Signed)
Chronic. Not well controlled.  Coughing daily.  Endorses using Spiriva and symbicort as prescribed.  Refilled at visit today.  Referral placed for patient to see Pulmonology due to cough.  Follow up in 3 months.  Call sooner if concerns arise.  

## 2022-04-02 NOTE — Progress Notes (Signed)
BP 107/62   Pulse (!) 108   Temp 98.2 F (36.8 C) (Oral)   Wt 143 lb 11.2 oz (65.2 kg)   SpO2 98%   BMI 23.54 kg/m    Subjective:    Patient ID: Karen Foster, female    DOB: 26-May-1960, 62 y.o.   MRN: [id number]  HPI: Karen Foster is a 62 y.o. female  Chief Complaint  Patient presents with   Depression   Hyperlipidemia   Cough    COPD Patient states she has been having an ongoing cough. She has cut down her smoking to less than a half a pack a day.  Using the spirivia and symbicort. COPD status: Ongoing Satisfied with current treatment?: no Oxygen use: no Dyspnea frequency: everyday Cough frequency: yes Rescue inhaler frequency:  not everyday but when she does use it no more than twice daily.   Limitation of activity: yes Productive cough: no Last Spirometry:  Pneumovax: Up to Date Influenza: Up to Date     Relevant past medical, surgical, family and social history reviewed and updated as indicated. Interim medical history since our last visit reviewed. Allergies and medications reviewed and updated.  Review of Systems  Eyes:  Negative for visual disturbance.  Respiratory:  Positive for cough. Negative for chest tightness and shortness of breath.   Cardiovascular:  Negative for chest pain, palpitations and leg swelling.  Neurological:  Negative for dizziness and headaches.    Per HPI unless specifically indicated above     Objective:    BP 107/62   Pulse (!) 108   Temp 98.2 F (36.8 C) (Oral)   Wt 143 lb 11.2 oz (65.2 kg)   SpO2 98%   BMI 23.54 kg/m   Wt Readings from Last 3 Encounters:  04/02/22 143 lb 11.2 oz (65.2 kg)  12/18/21 148 lb 6.4 oz (67.3 kg)  10/21/21 147 lb 11.2 oz (67 kg)    Physical Exam Vitals and nursing note reviewed.  Constitutional:      General: She is not in acute distress.    Appearance: Normal appearance. She is normal weight. She is not ill-appearing, toxic-appearing or diaphoretic.  HENT:     Head: Normocephalic.      Right Ear: External ear normal.     Left Ear: External ear normal.     Nose: Nose normal.     Mouth/Throat:     Mouth: Mucous membranes are moist.     Pharynx: Oropharynx is clear.  Eyes:     General:        Right eye: No discharge.        Left eye: No discharge.     Extraocular Movements: Extraocular movements intact.     Conjunctiva/sclera: Conjunctivae normal.     Pupils: Pupils are equal, round, and reactive to light.  Cardiovascular:     Rate and Rhythm: Normal rate and regular rhythm.     Heart sounds: No murmur heard. Pulmonary:     Effort: Pulmonary effort is normal. No respiratory distress.     Breath sounds: No wheezing or rales.  Musculoskeletal:     Cervical back: Normal range of motion and neck supple.  Skin:    General: Skin is warm and dry.     Capillary Refill: Capillary refill takes less than 2 seconds.  Neurological:     General: No focal deficit present.     Mental Status: She is alert and oriented to person, place, and time. Mental status is  at baseline.  Psychiatric:        Mood and Affect: Mood normal.        Behavior: Behavior normal.        Thought Content: Thought content normal.        Judgment: Judgment normal.     Results for orders placed or performed in visit on 12/18/21  WET PREP FOR [place], YEAST, CLUE   Specimen: Sterile Swab   Sterile Swab  Result Value Ref Range   Trichomonas Exam Negative Negative   Yeast Exam Negative Negative   Clue Cell Exam Positive (A) Negative  Urinalysis, Routine w reflex microscopic  Result Value Ref Range   Specific Gravity, UA 1.010 1.005 - 1.030   pH, UA 5.5 5.0 - 7.5   Color, UA Yellow Yellow   Appearance Ur Clear Clear   Leukocytes,UA Negative Negative   Protein,UA Negative Negative/Trace   Glucose, UA Negative Negative   Ketones, UA Negative Negative   RBC, UA Negative Negative   Bilirubin, UA Negative Negative   Urobilinogen, Ur 0.2 0.2 - 1.0 mg/dL   Nitrite, UA Negative Negative       Assessment & Plan:   Problem List Items Addressed This Visit       Respiratory   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 Not well controlled.  Coughing daily.  Endorses using Spiriva and symbicort as prescribed.  Refilled at visit today.  Referral placed for patient to see Pulmonology due to cough.  Follow up in 3 months.  Call sooner if concerns arise.       Relevant Medications   budesonide-formoterol (SYMBICORT) 160-4.5 MCG/ACT inhaler   benzonatate (TESSALON) 200 MG capsule   VENTOLIN HFA 108 (90 Base) MCG/ACT inhaler   Tiotropium Bromide Monohydrate (SPIRIVA RESPIMAT) 2.5 MCG/ACT AERS   Other Relevant Orders   Comp Met (CMET)   Ambulatory referral to Pulmonology     Other   Cough in adult    Chronic. Not well controlled.  Coughing daily.  Endorses using Spiriva and symbicort as prescribed.  Refilled at visit today.  Referral placed for patient to see Pulmonology due to cough.  Follow up in 3 months.  Call sooner if concerns arise.       Relevant Medications   benzonatate (TESSALON) 200 MG capsule   Mixed hyperlipidemia    Chronic.  Controlled.  Continue with current medication regimen of Crestor 5mg  daily. Refills sent today.  Labs ordered today.  Return to clinic in 6 months for reevaluation.  Call sooner if concerns arise.        Relevant Medications   rosuvastatin (CRESTOR) 10 MG tablet   Other Relevant Orders   Lipid Profile     Follow up plan: Return in about 3 months (around 07/01/2022) for Physical and Fasting labs.

## 2022-04-02 NOTE — Assessment & Plan Note (Signed)
Chronic.  Controlled.  Continue with current medication regimen of Crestor 5mg daily.  Refills sent today.  Labs ordered today.  Return to clinic in 6 months for reevaluation.  Call sooner if concerns arise.  ? ?

## 2022-04-03 LAB — COMPREHENSIVE METABOLIC PANEL
ALT: 11 IU/L (ref 0–32)
AST: 13 IU/L (ref 0–40)
Albumin/Globulin Ratio: 2 (ref 1.2–2.2)
Albumin: 4.5 g/dL (ref 3.8–4.9)
Alkaline Phosphatase: 100 IU/L (ref 44–121)
BUN/Creatinine Ratio: 21 (ref 12–28)
BUN: 25 mg/dL (ref 8–27)
Bilirubin Total: <0.2 mg/dL (ref 0.0–1.2)
CO2: 22 mmol/L (ref 20–29)
Calcium: 9.2 mg/dL (ref 8.7–10.3)
Chloride: 107 mmol/L — ABNORMAL HIGH (ref 96–106)
Creatinine, Ser: 1.18 mg/dL — ABNORMAL HIGH (ref 0.57–1.00)
Globulin, Total: 2.3 g/dL (ref 1.5–4.5)
Glucose: 76 mg/dL (ref 70–99)
Potassium: 4.9 mmol/L (ref 3.5–5.2)
Sodium: 142 mmol/L (ref 134–144)
Total Protein: 6.8 g/dL (ref 6.0–8.5)
eGFR: 53 mL/min/{1.73_m2} — ABNORMAL LOW (ref >59)

## 2022-04-03 LAB — LIPID PANEL
Chol/HDL Ratio: 5.1 ratio — ABNORMAL HIGH (ref 0.0–4.4)
Cholesterol, Total: 133 mg/dL (ref 100–199)
HDL: 26 mg/dL — ABNORMAL LOW (ref >39)
LDL Chol Calc (NIH): 69 mg/dL (ref 0–99)
Triglycerides: 231 mg/dL — ABNORMAL HIGH (ref 0–149)
VLDL Cholesterol Cal: 38 mg/dL (ref 5–40)

## 2022-04-03 NOTE — Progress Notes (Signed)
Hi Karen Foster.  Your cholesterol has improved from prior.  However, Your kidney function looks like you are a little dehydrated.  Make sure you are drinking at least 64 ounces of water daily. See you at our next visit.

## 2022-04-06 ENCOUNTER — Other Ambulatory Visit: Payer: Medicaid Other | Admitting: *Deleted

## 2022-04-06 ENCOUNTER — Encounter: Payer: Self-pay | Admitting: *Deleted

## 2022-04-06 NOTE — Patient Outreach (Signed)
Medicaid Managed Care   Nurse Care Manager Note  04/06/2022 Name:  AVIYANNA COLBAUGH MRN:  [id number] DOB:  11-11-1960  [name] is an 62 y.o. year old female who is a primary patient of Jon Billings, NP.  The [facility] Managed Care Coordination team was consulted for assistance with:    HLD COPD  Ms. Eckles was given information about Medicaid Managed Care Coordination team services today. [name] Patient agreed to services and verbal consent obtained.  Engaged with patient by telephone for follow up visit in response to provider referral for case management and/or care coordination services.   Assessments/Interventions:  Review of past medical history, allergies, medications, health status, including review of consultants reports, laboratory and other test data, was performed as part of comprehensive evaluation and provision of chronic care management services.  SDOH (Social Determinants of Health) assessments and interventions performed: SDOH Interventions    Flowsheet Row Patient Outreach Telephone from 04/06/2022 in [place] Office Visit from 04/02/2022 in [place] Patient Outreach Telephone from 02/02/2022 in [place] Patient Outreach Telephone from 12/03/2021 in [place] Coordination Office Visit from 10/21/2021 in [place] Patient Outreach Telephone from 08/05/2021 in [place] Interventions        Food Insecurity Interventions -- -- Intervention Not Indicated Intervention Not Indicated  [Patient reports no food insecurity at this time] -- Intervention Not Indicated  Housing Interventions Intervention Not Indicated -- -- -- -- --  Transportation Interventions -- -- Intervention Not Indicated -- -- Intervention Not Indicated  Depression Interventions/Treatment  -- Medication, Currently on  Treatment -- -- Medication --       Care Plan  Allergies  Allergen Reactions   Bee Venom Anaphylaxis   Ibuprofen Anaphylaxis   Sulfa Antibiotics Anaphylaxis   Tomato Anaphylaxis and Nausea And Vomiting   Hydroxyzine Nausea Only    Medications Reviewed Today     Reviewed by Melissa Montane, RN (Registered Nurse) on 04/06/22 at [place] List Status: <None>   Medication Order Taking? Sig Documenting Provider Last Dose Status Informant  Aspirin-Acetaminophen (GOODYS BODY PAIN PO) [id number] Yes Take by mouth. [provider] Taking Active   benzonatate (TESSALON) 200 MG capsule [id number] Yes Take 1 capsule (200 mg total) by mouth 2 (two) times daily as needed for cough. Jon Billings, NP Taking Active   budesonide-formoterol [facility]) 160-4.5 MCG/ACT inhaler [id number] Yes Inhale 2 puffs into the lungs 2 (two) times daily. Jon Billings, NP Taking Active   cyclobenzaprine (FLEXERIL) 10 MG tablet [id number] Yes Take 1 tablet by mouth three times daily as needed for muscle spasm Jon Billings, NP Taking Active   DULoxetine (CYMBALTA) 20 MG capsule [id number] Yes Take 1 capsule (20 mg total) by mouth daily. Mecum, Erin E, PA-C Taking Active   gabapentin (NEURONTIN) 300 MG capsule [id number] Yes Take 1 capsule (300 mg total) by mouth 4 (four) times daily. Mecum, Erin E, PA-C Taking Active   metoprolol tartrate (LOPRESSOR) 25 MG tablet [id number] Yes Take 1 tablet (25 mg total) by mouth 2 (two) times daily. Mecum, Erin E, PA-C Taking Active   montelukast (SINGULAIR) 10 MG tablet [id number] Yes Take 1 tablet (10 mg total) by mouth at bedtime. Mecum, Erin E, PA-C Taking Active   pantoprazole (PROTONIX) 20 MG tablet [id number] Yes Take 1 tablet (20 mg total) by mouth 2 (two) times daily. Mecum,  Dani Gobble, PA-C Taking Active   QUEtiapine (SEROQUEL) 25 MG tablet [id number] Yes Take 2-3 tablets (50-75 mg total) by mouth 2 (two) times daily. Take up to 50 mg (two tablets) in the morning and  up to 75 mg ( 3 tablets) at night. Mecum, Dani Gobble, PA-C Taking Active            Med Note (Robertine Kipper A   Wed Dec 03, 2021  4:08 PM) Taking PRN  rosuvastatin (CRESTOR) 10 MG tablet [id number] Yes Take 1 tablet (10 mg total) by mouth daily. Jon Billings, NP Taking Active   Tiotropium Bromide Monohydrate (SPIRIVA RESPIMAT) 2.5 MCG/ACT AERS [id number] Yes Inhale 2 puffs into the lungs daily. Jon Billings, NP Taking Active   VENTOLIN HFA 108 (90 Base) MCG/ACT inhaler [id number] Yes Inhale 2 puffs into the lungs every 6 (six) hours as needed for wheezing or shortness of breath. Jon Billings, NP Taking Active             Patient Active Problem List   Diagnosis Date Noted   Mixed hyperlipidemia 04/02/2022   Cough in adult 12/19/2021   Vaginal dryness 12/19/2021   Gastroesophageal reflux disease 10/21/2021   Tachycardia 10/21/2021   Mitral valve prolapse 10/21/2021   Other chronic pain 12/25/2020   Allergic rhinitis 05/26/2019   COPD mixed type ([place]) 12/01/2018   Cigarette smoker 06/08/2018   Arthritis 02/19/2018   Hemorrhoids 02/18/2018   Reactive airway disease 02/18/2018   Migraines 12/12/2017   Deviated septum 12/03/2017   Heart murmur 12/03/2017   Depression 12/03/2017   Panic disorder     Conditions to be addressed/monitored per PCP order:  HLD and COPD  Care Plan : RN Care Manager Plan of Care  Updates made by Melissa Montane, RN since 04/06/2022 12:00 AM     Problem: Chronic health managment needs related to COPD      Long-Range Goal: Development of Plan of Care to address health managment needs related to COPD   Start Date: 01/27/2021  Expected End Date: 06/30/2022  Priority: High  Note:   Current Barriers:  Chronic Disease Management support and education needs related to HLD and COPD Ms. Robitaille continues to have a cough that is worse at night. Recent PCP visit, referral was placed for Pulmonology. Ms. Weyandt has a new puppy and is enjoying her puppy's  company.  RNCM Clinical Goal(s):  Patient will verbalize understanding of plan for management of HLD as evidenced by patient verbalization and self monitoring activity take all medications exactly as prescribed and will call provider for medication related questions as evidenced by documentation in EMR    attend all scheduled medical appointments: reschedule with PCP and Pulmonology as evidenced by provider documentation in EMR        continue to work with RN Care Manager and/or Social Worker to address care management and care coordination needs related to HLD as evidenced by adherence to CM Team Scheduled appointments     through collaboration with Consulting civil engineer, provider, and care team.   Interventions: Inter-disciplinary care team collaboration (see longitudinal plan of care) Evaluation of current treatment plan related to  self management and patient's adherence to plan as established by provider Provided therapeutic listening   COPD Interventions:  (Status:  Goal on track:  Yes.) Long Term Goal Provided education about and advised patient to utilize infection prevention strategies to reduce risk of respiratory infection Assessed social determinant of health barriers Provided education on managing COPD Discussed Pulmonology  referral Reviewed provider notes   Hyperlipidemia:  (Status: Condition stable. Not addressed this visit.) Long Term Goal  Lab Results  Component Value Date   CHOL 133 04/02/2022   HDL 26 (L) 04/02/2022   LDLCALC 69 04/02/2022   TRIG 231 (H) 04/02/2022   CHOLHDL 5.1 (H) 04/02/2022     Medication review performed; medication list updated in electronic medical record.  Counseled on importance of regular laboratory monitoring as prescribed; Reviewed exercise goals and target of 150 minutes per week; Assessed social determinant of health barriers;  Provided education on heart healthy diet and managing cholesterol Encouraged patient to discuss any concerns or  questions with provider   Patient Goals/Self-Care Activities: Take medications as prescribed   Attend all scheduled provider appointments Call pharmacy for medication refills 3-7 days in advance of running out of medications Attend church or other social activities Perform all self care activities independently  Call provider office for new concerns or questions  - adhere to prescribed diet: heart healthy - develop an exercise routine       Follow Up:  Patient agrees to Care Plan and Follow-up.  Plan: The Managed Medicaid care management team will reach out to the patient again over the next 60 days.  Date/time of next scheduled RN care management/care coordination outreach:  06/08/22 @ 3:30pm  Lurena Joiner RN, BSN [place] RN Care Coordinator

## 2022-04-06 NOTE — Patient Instructions (Signed)
Visit Information  Ms. Holsapple was given information about Medicaid Managed Care team care coordination services as a part of their [facility] Medicaid benefit. [name] verbally consented to engagement with the [facility] Managed Care team.   If you are experiencing a medical emergency, please call 911 or report to your local emergency department or urgent care.   If you have a non-emergency medical problem during routine business hours, please contact your provider's office and ask to speak with a nurse.   For questions related to your [facility] health plan, please call: [phone] or go here:[email or web address]  If you would like to schedule transportation through your [facility] plan, please call the following number at least 2 days in advance of your appointment: [phone].  You can also use the MTM portal or MTM mobile app to manage your rides. For the portal, please go to [email or web address].  Call the [place] at [phone], at any time, 24 hours a day, 7 days a week. If you are in danger or need immediate medical attention call 911.  If you would like help to quit smoking, call 1-800-QUIT-NOW 402-140-3220) OR Espaol: 1-855-Djelo-Ya (3-500-938-1829) o para ms informacin haga clic aqu or Text READY to 200-400 to register via text  Ms. Weckwerth,   Please see education materials related to COPD and cholesterol provided by MyChart link.  Patient verbalizes understanding of instructions and care plan provided today and agrees to view in [place]. Active MyChart status and patient understanding of how to access instructions and care plan via MyChart confirmed with patient.     Telephone follow up appointment with Managed Medicaid care management team member scheduled for:06/08/22 @ 3:30pm  Lurena Joiner RN, BSN [place] RN Care Coordinator   Following is a copy of your plan of care:  Care Plan : RN Care  Manager Plan of Care  Updates made by Melissa Montane, RN since 04/06/2022 12:00 AM     Problem: Chronic health managment needs related to COPD      Long-Range Goal: Development of Plan of Care to address health managment needs related to COPD   Start Date: 01/27/2021  Expected End Date: 06/30/2022  Priority: High  Note:   Current Barriers:  Chronic Disease Management support and education needs related to HLD and COPD Ms. Beharry continues to have a cough that is worse at night. Recent PCP visit, referral was placed for Pulmonology. Ms. Lemire has a new puppy and is enjoying her puppy's company.  RNCM Clinical Goal(s):  Patient will verbalize understanding of plan for management of HLD as evidenced by patient verbalization and self monitoring activity take all medications exactly as prescribed and will call provider for medication related questions as evidenced by documentation in EMR    attend all scheduled medical appointments: reschedule with PCP and Pulmonology as evidenced by provider documentation in EMR        continue to work with RN Care Manager and/or Social Worker to address care management and care coordination needs related to HLD as evidenced by adherence to CM Team Scheduled appointments     through collaboration with RN Care manager, provider, and care team.   Interventions: Inter-disciplinary care team collaboration (see longitudinal plan of care) Evaluation of current treatment plan related to  self management and patient's adherence to plan as established by provider Provided therapeutic listening   COPD Interventions:  (Status:  Goal on track:  Yes.) Long Term Goal Provided education about  and advised patient to utilize infection prevention strategies to reduce risk of respiratory infection Assessed social determinant of health barriers Provided education on managing COPD Discussed Pulmonology referral Reviewed provider notes   Hyperlipidemia:  (Status: Condition  stable. Not addressed this visit.) Long Term Goal  Lab Results  Component Value Date   CHOL 133 04/02/2022   HDL 26 (L) 04/02/2022   LDLCALC 69 04/02/2022   TRIG 231 (H) 04/02/2022   CHOLHDL 5.1 (H) 04/02/2022     Medication review performed; medication list updated in electronic medical record.  Counseled on importance of regular laboratory monitoring as prescribed; Reviewed exercise goals and target of 150 minutes per week; Assessed social determinant of health barriers;  Provided education on heart healthy diet and managing cholesterol Encouraged patient to discuss any concerns or questions with provider   Patient Goals/Self-Care Activities: Take medications as prescribed   Attend all scheduled provider appointments Call pharmacy for medication refills 3-7 days in advance of running out of medications Attend church or other social activities Perform all self care activities independently  Call provider office for new concerns or questions  - adhere to prescribed diet: heart healthy - develop an exercise routine

## 2022-04-13 ENCOUNTER — Ambulatory Visit: Payer: Medicaid Other | Admitting: Nurse Practitioner

## 2022-04-16 ENCOUNTER — Other Ambulatory Visit: Payer: Self-pay | Admitting: Nurse Practitioner

## 2022-04-16 DIAGNOSIS — G8929 Other chronic pain: Secondary | ICD-10-CM

## 2022-04-16 DIAGNOSIS — R059 Cough, unspecified: Secondary | ICD-10-CM

## 2022-04-16 DIAGNOSIS — K219 Gastro-esophageal reflux disease without esophagitis: Secondary | ICD-10-CM

## 2022-04-16 MED ORDER — GABAPENTIN 300 MG PO CAPS: 300.0000 mg | ORAL_CAPSULE | Freq: Four times a day (QID) | ORAL | 2 refills | Status: DC

## 2022-04-16 MED ORDER — BENZONATATE 200 MG PO CAPS: 200.0000 mg | ORAL_CAPSULE | Freq: Two times a day (BID) | ORAL | 0 refills | Status: DC | PRN

## 2022-04-16 NOTE — Telephone Encounter (Signed)
Requested medications are due for refill today.  unsure  Requested medications are on the active medications list.  yes  Last refill. 12/17/2021 #90 0 rf  Future visit scheduled.   yes  Notes to clinic.  Refill not delegated.    Requested Prescriptions  Pending Prescriptions Disp Refills   cyclobenzaprine (FLEXERIL) 10 MG tablet 360 tablet 1    Sig: Take 1 tablet (10 mg total) by mouth 3 (three) times daily as needed. for muscle spams     Not Delegated - Analgesics:  Muscle Relaxants Failed - 04/16/2022  4:27 PM      Failed - This refill cannot be delegated      Passed - Valid encounter within last 6 months    Recent Outpatient Visits           2 weeks ago COPD mixed type [facility])   [place] Jon Billings, NP   3 months ago Cough in adult   [place], Erin E, PA-C   5 months ago COPD mixed type [facility])   [place], Erin E, PA-C   10 months ago Severe episode of recurrent major depressive disorder, without psychotic features [facility])   [place] Jon Billings, NP   10 months ago COPD mixed type [facility])   [place] Jon Billings, NP       Future Appointments             In 2 months Jon Billings, NP [place], [facility]            Signed Prescriptions Disp Refills   benzonatate (TESSALON) 200 MG capsule 20 capsule 0    Sig: Take 1 capsule (200 mg total) by mouth 2 (two) times daily as needed for cough.     Ear, Nose, and Throat:  Antitussives/Expectorants Passed - 04/16/2022  4:27 PM      Passed - Valid encounter within last 12 months    Recent Outpatient Visits           2 weeks ago COPD mixed type [facility])   [place], NP   3 months ago Cough in adult   [place], Dani Gobble, PA-C   5 months ago COPD mixed type  [facility])   [place], Erin E, PA-C   10 months ago Severe episode of recurrent major depressive disorder, without psychotic features [facility])   [place] Jon Billings, NP   10 months ago COPD mixed type [facility])   [place] Jon Billings, NP       Future Appointments             In 2 months Jon Billings, NP [place], [facility]             gabapentin (NEURONTIN) 300 MG capsule 120 capsule 2    Sig: Take 1 capsule (300 mg total) by mouth 4 (four) times daily.     Neurology: Anticonvulsants - gabapentin Failed - 04/16/2022  4:27 PM      Failed - Cr in normal range and within 360 days    Creatinine, Ser  Date Value Ref Range Status  04/02/2022 1.18 (H) 0.57 - 1.00 mg/dL Final         Passed - Completed PHQ-2 or PHQ-9 in the last  360 days      Passed - Valid encounter within last 12 months    Recent Outpatient Visits           2 weeks ago COPD mixed type [facility])   [place], NP   3 months ago Cough in adult   [place], Dani Gobble, PA-C   5 months ago COPD mixed type [facility])   [place] Mecum, Erin E, PA-C   10 months ago Severe episode of recurrent major depressive disorder, without psychotic features [facility])   [place] Jon Billings, NP   10 months ago COPD mixed type [facility])   [place] Jon Billings, NP       Future Appointments             In 2 months Jon Billings, NP [place], [facility]

## 2022-04-16 NOTE — Telephone Encounter (Signed)
Requested Prescriptions  Pending Prescriptions Disp Refills   benzonatate (TESSALON) 200 MG capsule 20 capsule 0    Sig: Take 1 capsule (200 mg total) by mouth 2 (two) times daily as needed for cough.     Ear, Nose, and Throat:  Antitussives/Expectorants Passed - 04/16/2022  4:27 PM      Passed - Valid encounter within last 12 months    Recent Outpatient Visits           2 weeks ago COPD mixed type [facility])   [place], NP   3 months ago Cough in adult   [place], Dani Gobble, PA-C   5 months ago COPD mixed type [facility])   [place], Erin E, PA-C   10 months ago Severe episode of recurrent major depressive disorder, without psychotic features [facility])   [place] Jon Billings, NP   10 months ago COPD mixed type [facility])   [place] Jon Billings, NP       Future Appointments             In 2 months Jon Billings, NP [place], [facility]             gabapentin (NEURONTIN) 300 MG capsule 120 capsule 2    Sig: Take 1 capsule (300 mg total) by mouth 4 (four) times daily.     Neurology: Anticonvulsants - gabapentin Failed - 04/16/2022  4:27 PM      Failed - Cr in normal range and within 360 days    Creatinine, Ser  Date Value Ref Range Status  04/02/2022 1.18 (H) 0.57 - 1.00 mg/dL Final         Passed - Completed PHQ-2 or PHQ-9 in the last 360 days      Passed - Valid encounter within last 12 months    Recent Outpatient Visits           2 weeks ago COPD mixed type [facility])   [place], NP   3 months ago Cough in adult   [place], Erin E, PA-C   5 months ago COPD mixed type [facility])   [place], Erin E, PA-C   10 months ago Severe episode of recurrent major depressive disorder,  without psychotic features [facility])   [place] Jon Billings, NP   10 months ago COPD mixed type [facility])   [place] Jon Billings, NP       Future Appointments             In 2 months Jon Billings, NP [place], [facility]             cyclobenzaprine (FLEXERIL) 10 MG tablet 360 tablet 1    Sig: Take 1 tablet (10 mg total) by mouth 3 (three) times daily as needed. for muscle spams     Not Delegated - Analgesics:  Muscle Relaxants Failed - 04/16/2022  4:27 PM      Failed - This refill cannot be delegated      Passed - Valid encounter within last 6 months    Recent Outpatient Visits           2 weeks ago COPD mixed type [facility])   [place] Jon Billings, NP  3 months ago Cough in adult   [place] [facility] Mecum, Erin E, PA-C   5 months ago COPD mixed type [facility])   [place] Mecum, Erin E, PA-C   10 months ago Severe episode of recurrent major depressive disorder, without psychotic features [facility])   [place], Karen, NP   10 months ago COPD mixed type [facility])   [place] Jon Billings, NP       Future Appointments             In 2 months Jon Billings, NP [place], [facility]

## 2022-04-16 NOTE — Telephone Encounter (Signed)
Pt called to report that she misspoke. She meant to say that she needs her pantoprazole instead of her cyclobenzaprine.  [place], [place] - [address].  [address] [place] [address]  Phone: [phone] Fax: [phone]

## 2022-04-16 NOTE — Telephone Encounter (Signed)
Pt needs refill for benzonatate (TESSALON) 200 MG capsule until her pulmonology appt in March   Medication Refill - Medication: gabapentin (NEURONTIN) 300 MG capsule [id number]  cyclobenzaprine (FLEXERIL) 10 MG tablet [id number]    Has the patient contacted their pharmacy? No. (Agent: If no, request that the patient contact the pharmacy for the refill. If patient does not wish to contact the pharmacy document the reason why and proceed with request.) (Agent: If yes, when and what did the pharmacy advise?)  Preferred Pharmacy (with phone number or street name):  [facility], [place].     Has the patient been seen for an appointment in the last year OR does the patient have an upcoming appointment? Yes.    Agent: Please be advised that RX refills may take up to 3 business days. We ask that you follow-up with your pharmacy.

## 2022-04-16 NOTE — Telephone Encounter (Signed)
Requested medications are due for refill today.  Unsure  Requested medications are on the active medications list.  yes  Last refill. 12/17/2021 #90 0 rf  Future visit scheduled.   yes  Notes to clinic.  Refill not delegated.    Requested Prescriptions  Pending Prescriptions Disp Refills   cyclobenzaprine (FLEXERIL) 10 MG tablet 360 tablet 1    Sig: Take 1 tablet (10 mg total) by mouth 3 (three) times daily as needed. for muscle spams     Not Delegated - Analgesics:  Muscle Relaxants Failed - 04/16/2022  5:36 PM      Failed - This refill cannot be delegated      Passed - Valid encounter within last 6 months    Recent Outpatient Visits           2 weeks ago COPD mixed type [facility])   [place] Jon Billings, NP   3 months ago Cough in adult   [place], Erin E, PA-C   5 months ago COPD mixed type [facility])   [place], Erin E, PA-C   10 months ago Severe episode of recurrent major depressive disorder, without psychotic features [facility])   [place] Jon Billings, NP   10 months ago COPD mixed type [facility])   [place] Jon Billings, NP       Future Appointments             In 2 months Jon Billings, NP [place], [facility]            Signed Prescriptions Disp Refills   benzonatate (TESSALON) 200 MG capsule 20 capsule 0    Sig: Take 1 capsule (200 mg total) by mouth 2 (two) times daily as needed for cough.     Ear, Nose, and Throat:  Antitussives/Expectorants Passed - 04/16/2022  4:27 PM      Passed - Valid encounter within last 12 months    Recent Outpatient Visits           2 weeks ago COPD mixed type [facility])   [place], NP   3 months ago Cough in adult   [place], Dani Gobble, PA-C   5 months ago COPD mixed type  [facility])   [place], Erin E, PA-C   10 months ago Severe episode of recurrent major depressive disorder, without psychotic features [facility])   [place] Jon Billings, NP   10 months ago COPD mixed type [facility])   [place] Jon Billings, NP       Future Appointments             In 2 months Jon Billings, NP [place], [facility]             gabapentin (NEURONTIN) 300 MG capsule 120 capsule 2    Sig: Take 1 capsule (300 mg total) by mouth 4 (four) times daily.     Neurology: Anticonvulsants - gabapentin Failed - 04/16/2022  4:27 PM      Failed - Cr in normal range and within 360 days    Creatinine, Ser  Date Value Ref Range Status  04/02/2022 1.18 (H) 0.57 - 1.00 mg/dL Final         Passed - Completed PHQ-2 or PHQ-9 in the last  360 days      Passed - Valid encounter within last 12 months    Recent Outpatient Visits           2 weeks ago COPD mixed type [facility])   [place], NP   3 months ago Cough in adult   [place], Dani Gobble, PA-C   5 months ago COPD mixed type [facility])   [place] Mecum, Erin E, PA-C   10 months ago Severe episode of recurrent major depressive disorder, without psychotic features [facility])   [place] Jon Billings, NP   10 months ago COPD mixed type [place])   [place] Jon Billings, NP       Future Appointments             In 2 months Jon Billings, NP [place], [facility]

## 2022-04-17 MED ORDER — CYCLOBENZAPRINE HCL 10 MG PO TABS: 10.0000 mg | ORAL_TABLET | Freq: Three times a day (TID) | ORAL | 1 refills | Status: DC | PRN

## 2022-04-22 MED ORDER — PANTOPRAZOLE SODIUM 20 MG PO TBEC: 20.0000 mg | DELAYED_RELEASE_TABLET | Freq: Two times a day (BID) | ORAL | 1 refills | Status: DC

## 2022-04-22 NOTE — Addendum Note (Signed)
Addended by: Erie Noe on: 04/22/2022 05:20 PM   Modules accepted: Orders

## 2022-04-22 NOTE — Telephone Encounter (Signed)
Pt called to request her pantoprazole (PROTONIX) 20 MG tablet , she requested this on the 15th.  [place], [place]. [address] [address] Phone: [phone]  Fax: [phone]

## 2022-04-22 NOTE — Telephone Encounter (Signed)
Requested Prescriptions  Pending Prescriptions Disp Refills   pantoprazole (PROTONIX) 20 MG tablet 180 tablet 1    Sig: Take 1 tablet (20 mg total) by mouth 2 (two) times daily.     Gastroenterology: Proton Pump Inhibitors Passed - 04/22/2022  5:20 PM      Passed - Valid encounter within last 12 months    Recent Outpatient Visits           2 weeks ago COPD mixed type [facility])   [place] Jon Billings, NP   4 months ago Cough in adult   [place], Dani Gobble, PA-C   6 months ago COPD mixed type [facility])   [place], Erin E, PA-C   10 months ago Severe episode of recurrent major depressive disorder, without psychotic features [facility])   [place] Jon Billings, NP   11 months ago COPD mixed type [facility])   [place] Jon Billings, NP       Future Appointments             In 2 months Jon Billings, NP [place], [facility]            Signed Prescriptions Disp Refills   benzonatate (TESSALON) 200 MG capsule 20 capsule 0    Sig: Take 1 capsule (200 mg total) by mouth 2 (two) times daily as needed for cough.     Ear, Nose, and Throat:  Antitussives/Expectorants Passed - 04/16/2022  4:27 PM      Passed - Valid encounter within last 12 months    Recent Outpatient Visits           2 weeks ago COPD mixed type [facility])   [place], NP   4 months ago Cough in adult   [place], Dani Gobble, PA-C   6 months ago COPD mixed type [facility])   [place], Erin E, PA-C   10 months ago Severe episode of recurrent major depressive disorder, without psychotic features [facility])   [place] Jon Billings, NP   11 months ago COPD mixed type [facility])   [place] Jon Billings, NP       Future Appointments             In 2 months Jon Billings, NP [place], [facility]             gabapentin (NEURONTIN) 300 MG capsule 120 capsule 2    Sig: Take 1 capsule (300 mg total) by mouth 4 (four) times daily.     Neurology: Anticonvulsants - gabapentin Failed - 04/16/2022  4:27 PM      Failed - Cr in normal range and within 360 days    Creatinine, Ser  Date Value Ref Range Status  04/02/2022 1.18 (H) 0.57 - 1.00 mg/dL Final         Passed - Completed PHQ-2 or PHQ-9 in the last 360 days      Passed - Valid encounter within last 12 months    Recent Outpatient Visits           2 weeks ago COPD mixed type [facility])   [place], NP   4 months ago Cough in adult   [place] Mecum, Junie Panning  E, PA-C   6 months ago COPD mixed type ([place])   [place] Mecum, Erin E, PA-C   10 months ago Severe episode of recurrent major depressive disorder, without psychotic features [facility])   [place] Jon Billings, NP   11 months ago COPD mixed type [facility])   [place] Jon Billings, NP       Future Appointments             In 2 months Jon Billings, NP [place], [facility]             cyclobenzaprine (FLEXERIL) 10 MG tablet 360 tablet 1    Sig: Take 1 tablet (10 mg total) by mouth 3 (three) times daily as needed. for muscle spams     Not Delegated - Analgesics:  Muscle Relaxants Failed - 04/17/2022  9:41 AM      Failed - This refill cannot be delegated      Passed - Valid encounter within last 6 months    Recent Outpatient Visits           2 weeks ago COPD mixed type [facility])   [place], NP   4 months ago Cough in adult   [place], Dani Gobble, PA-C   6 months ago COPD mixed type [facility])    [place], Erin E, PA-C   10 months ago Severe episode of recurrent major depressive disorder, without psychotic features [facility])   [place] Jon Billings, NP   11 months ago COPD mixed type [facility])   [place] Jon Billings, NP       Future Appointments             In 2 months Jon Billings, NP [place], [facility]

## 2022-04-29 ENCOUNTER — Other Ambulatory Visit: Payer: Self-pay | Admitting: Nurse Practitioner

## 2022-04-29 DIAGNOSIS — R059 Cough, unspecified: Secondary | ICD-10-CM

## 2022-04-29 NOTE — Telephone Encounter (Signed)
Requesting a 30 day supply, she says her insurance makes her pay 4 dollars per prescription so this will be cheaper for her.  [place], [place] [address]  Phone: [phone] Fax: [phone]   benzonatate (TESSALON) 200 MG capsule

## 2022-04-29 NOTE — Telephone Encounter (Signed)
Requested medication (s) are due for refill today:Yes  Requested medication (s) are on the active medication list:Yes  Last refill:  04/16/22  Future visit scheduled: No  Notes to clinic:  Unsure if provider wants to refill, patient requesting 30 day supply, routing for approval     Requested Prescriptions  Pending Prescriptions Disp Refills   benzonatate (TESSALON) 200 MG capsule 20 capsule 0    Sig: Take 1 capsule (200 mg total) by mouth 2 (two) times daily as needed for cough.     Ear, Nose, and Throat:  Antitussives/Expectorants Passed - 04/29/2022  2:33 PM      Passed - Valid encounter within last 12 months    Recent Outpatient Visits           3 weeks ago COPD mixed type [facility])   [place], NP   4 months ago Cough in adult   [place], Dani Gobble, PA-C   6 months ago COPD mixed type [facility])   [place] Mecum, Erin E, PA-C   11 months ago Severe episode of recurrent major depressive disorder, without psychotic features  [facility])   [place] Jon Billings, NP   11 months ago COPD mixed type [facility])   [place] Jon Billings, NP       Future Appointments             In 2 months Jon Billings, NP [place], [facility]

## 2022-04-30 MED ORDER — BENZONATATE 200 MG PO CAPS: 200.0000 mg | ORAL_CAPSULE | Freq: Two times a day (BID) | ORAL | 0 refills | Status: DC | PRN

## 2022-05-01 DIAGNOSIS — Z419 Encounter for procedure for purposes other than remedying health state, unspecified: Secondary | ICD-10-CM | POA: Diagnosis not present

## 2022-05-29 ENCOUNTER — Other Ambulatory Visit: Payer: Self-pay | Admitting: Nurse Practitioner

## 2022-05-29 DIAGNOSIS — J449 Chronic obstructive pulmonary disease, unspecified: Secondary | ICD-10-CM

## 2022-05-29 NOTE — Telephone Encounter (Signed)
Requested Prescriptions  Pending Prescriptions Disp Refills   Miles 2.5 MCG/ACT AERS [Pharmacy Med Name: Spiriva Respimat 2.5 mcg/actuation solution for inhalation] 4 g 1    Sig: inhale TWO puffs into THE lungs daily     Pulmonology:  Anticholinergic Agents Passed - 05/29/2022 10:53 AM      Passed - Valid encounter within last 12 months    Recent Outpatient Visits           1 month ago COPD mixed type [facility])   [place] Jon Billings, NP   5 months ago Cough in adult   [place], Dani Gobble, PA-C   7 months ago COPD mixed type [facility])   [place], PA-C   1 year ago Severe episode of recurrent major depressive disorder, without psychotic features ([place])   [place] Jon Billings, NP   1 year ago COPD mixed type [facility])   [place] Jon Billings, NP       Future Appointments             In 1 month Jon Billings, NP [place], [facility]             VENTOLIN HFA 108 [phone] Base) MCG/ACT inhaler [Pharmacy Med Name: Ventolin HFA 90 mcg/actuation aerosol inhaler] 18 g 1    Sig: inhale TWO puffs into THE lungs EVERY 6 HOURS AS NEEDED FOR wheezing OR shortness of breath.     Pulmonology:  Beta Agonists 2 Passed - 05/29/2022 10:53 AM      Passed - Last BP in normal range    BP Readings from Last 1 Encounters:  04/02/22 107/62         Passed - Last Heart Rate in normal range    Pulse Readings from Last 1 Encounters:  04/02/22 (!) 108         Passed - Valid encounter within last 12 months    Recent Outpatient Visits           1 month ago COPD mixed type ([place])   [place], NP   5 months ago Cough in adult   [place], Dani Gobble, PA-C   7 months ago COPD mixed type [facility])   [place], PA-C   1 year ago Severe episode of recurrent major depressive disorder, without psychotic features ([place])   [place] Jon Billings, NP   1 year ago COPD mixed type [facility])   [place] Jon Billings, NP       Future Appointments             In 1 month Jon Billings, NP [place], [facility]

## 2022-06-01 DIAGNOSIS — Z419 Encounter for procedure for purposes other than remedying health state, unspecified: Secondary | ICD-10-CM | POA: Diagnosis not present

## 2022-06-03 ENCOUNTER — Other Ambulatory Visit: Payer: Self-pay | Admitting: Nurse Practitioner

## 2022-06-03 DIAGNOSIS — R059 Cough, unspecified: Secondary | ICD-10-CM

## 2022-06-03 NOTE — Telephone Encounter (Signed)
Requested Prescriptions  Pending Prescriptions Disp Refills   benzonatate (TESSALON) 200 MG capsule [Pharmacy Med Name: benzonatate 200 mg capsule] 60 capsule 0    Sig: TAKE ONE CAPSULE BY MOUTH TWICE DAILY AS NEEDED FOR COUGH.     Ear, Nose, and Throat:  Antitussives/Expectorants Passed - 06/03/2022 12:37 PM      Passed - Valid encounter within last 12 months    Recent Outpatient Visits           2 months ago COPD mixed type [facility])   [place], NP   5 months ago Cough in adult   [place], Dani Gobble, PA-C   7 months ago COPD mixed type [facility])   [place], PA-C   1 year ago Severe episode of recurrent major depressive disorder, without psychotic features ([place])   [place] Jon Billings, NP   1 year ago COPD mixed type [facility])   [place] Jon Billings, NP       Future Appointments             In 4 weeks Jon Billings, NP [place], [facility]

## 2022-06-08 ENCOUNTER — Other Ambulatory Visit: Payer: Medicaid Other | Admitting: *Deleted

## 2022-06-08 NOTE — Patient Outreach (Signed)
Medicaid Managed Care   Nurse Care Manager Note  06/08/2022 Name:  Karen Foster MRN:  [id number] DOB:  1960-03-11  Karen Foster is an 62 y.o. year old female who is a primary patient of Karen Grooms, NP.  The [facility] Managed Care Coordination team was consulted for assistance with:    HLD COPD  Ms. Salt was given information about Medicaid Managed Care Coordination team services today. Karen Foster Patient agreed to services and verbal consent obtained.  Engaged with patient by telephone for follow up visit in response to provider referral for case management and/or care coordination services.   Assessments/Interventions:  Review of past medical history, allergies, medications, health status, including review of consultants reports, laboratory and other test data, was performed as part of comprehensive evaluation and provision of chronic care management services.  SDOH (Social Determinants of Health) assessments and interventions performed: SDOH Interventions    Flowsheet Row Patient Outreach Telephone from 04/06/2022 in [facility] Office Visit from 04/02/2022 in [facility] Patient Outreach Telephone from 02/02/2022 in [facility] [facility] Patient Outreach Telephone from 12/03/2021 in [facility] Care Coordination Office Visit from 10/21/2021 in [facility] Patient Outreach Telephone from 08/05/2021 in [facility] Coordination  SDOH Interventions        Food Insecurity Interventions -- -- Intervention Not Indicated Intervention Not Indicated  [Patient reports no food insecurity at this time] -- Intervention Not Indicated  Housing Interventions Intervention Not Indicated -- -- -- -- --  Transportation Interventions -- -- Intervention Not Indicated -- -- Intervention Not Indicated  Depression Interventions/Treatment  -- Medication, Currently on  Treatment -- -- Medication --       Care Plan  Allergies  Allergen Reactions   Bee Venom Anaphylaxis   Ibuprofen Anaphylaxis   Sulfa Antibiotics Anaphylaxis   Tomato Anaphylaxis and Nausea And Vomiting   Hydroxyzine Nausea Only    Medications Reviewed Today     Reviewed by Heidi Dach, RN (Registered Nurse) on 04/06/22 at 1624  Med List Status: <None>   Medication Order Taking? Sig Documenting Provider Last Dose Status Informant  Aspirin-Acetaminophen (GOODYS BODY PAIN PO) [id number] Yes Take by mouth. [provider] Taking Active   benzonatate (TESSALON) 200 MG capsule [id number] Yes Take 1 capsule (200 mg total) by mouth 2 (two) times daily as needed for cough. Karen Grooms, NP Taking Active   budesonide-formoterol [facility]) 160-4.5 MCG/ACT inhaler [id number] Yes Inhale 2 puffs into the lungs 2 (two) times daily. Karen Grooms, NP Taking Active   cyclobenzaprine (FLEXERIL) 10 MG tablet [id number] Yes Take 1 tablet by mouth three times daily as needed for muscle spasm Karen Grooms, NP Taking Active   DULoxetine (CYMBALTA) 20 MG capsule [id number] Yes Take 1 capsule (20 mg total) by mouth daily. Mecum, Erin E, PA-C Taking Active   gabapentin (NEURONTIN) 300 MG capsule [id number] Yes Take 1 capsule (300 mg total) by mouth 4 (four) times daily. Mecum, Erin E, PA-C Taking Active   metoprolol tartrate (LOPRESSOR) 25 MG tablet [id number] Yes Take 1 tablet (25 mg total) by mouth 2 (two) times daily. Mecum, Erin E, PA-C Taking Active   montelukast (SINGULAIR) 10 MG tablet [id number] Yes Take 1 tablet (10 mg total) by mouth at bedtime. Mecum, Erin E, PA-C Taking Active   pantoprazole (PROTONIX) 20 MG tablet [id number] Yes Take 1 tablet (20 mg total) by mouth 2 (two) times daily. Mecum,  Oswaldo ConroyErin E, PA-C Taking Active   QUEtiapine (SEROQUEL) 25 MG tablet [id number] Yes Take 2-3 tablets (50-75 mg total) by mouth 2 (two) times daily. Take up to 50 mg (two tablets) in the morning and  up to 75 mg ( 3 tablets) at night. Mecum, Oswaldo ConroyErin E, PA-C Taking Active            Med Note (Ovie Eastep A   Wed Dec 03, 2021  4:08 PM) Taking PRN  rosuvastatin (CRESTOR) 10 MG tablet [id number] Yes Take 1 tablet (10 mg total) by mouth daily. Karen GroomsHoldsworth, Karen, NP Taking Active   Tiotropium Bromide Monohydrate (SPIRIVA RESPIMAT) 2.5 MCG/ACT AERS [id number] Yes Inhale 2 puffs into the lungs daily. Karen GroomsHoldsworth, Karen, NP Taking Active   VENTOLIN HFA 108 (90 Base) MCG/ACT inhaler [id number] Yes Inhale 2 puffs into the lungs every 6 (six) hours as needed for wheezing or shortness of breath. Karen GroomsHoldsworth, Karen, NP Taking Active             Patient Active Problem List   Diagnosis Date Noted   Mixed hyperlipidemia 04/02/2022   Cough in adult 12/19/2021   Vaginal dryness 12/19/2021   Gastroesophageal reflux disease 10/21/2021   Tachycardia 10/21/2021   Mitral valve prolapse 10/21/2021   Other chronic pain 12/25/2020   Allergic rhinitis 05/26/2019   COPD mixed type 12/01/2018   Cigarette smoker 06/08/2018   Arthritis 02/19/2018   Hemorrhoids 02/18/2018   Reactive airway disease 02/18/2018   Migraines 12/12/2017   Deviated septum 12/03/2017   Heart murmur 12/03/2017   Depression 12/03/2017   Panic disorder     Conditions to be addressed/monitored per PCP order:  HLD and COPD  Care Plan : RN Care Manager Plan of Care  Updates made by Heidi Dachobb, Meela Wareing A, RN since 06/08/2022 12:00 AM  Completed 06/08/2022   Problem: Chronic health managment needs related to COPD Resolved 06/08/2022     Long-Range Goal: Development of Plan of Care to address health managment needs related to COPD Completed 06/08/2022  Start Date: 01/27/2021  Expected End Date: 06/30/2022  Priority: High  Note:   Current Barriers:  Chronic Disease Management support and education needs related to HLD and COPD  RNCM Clinical Goal(s):  Patient will verbalize understanding of plan for management of HLD as evidenced by patient  verbalization and self monitoring activity take all medications exactly as prescribed and will call provider for medication related questions as evidenced by documentation in EMR    attend all scheduled medical appointments: reschedule with PCP and Pulmonology as evidenced by provider documentation in EMR        continue to work with RN Care Manager and/or Social Worker to address care management and care coordination needs related to HLD as evidenced by adherence to CM Team Scheduled appointments     through collaboration with RN Care manager, provider, and care team.   Interventions: Inter-disciplinary care team collaboration (see longitudinal plan of care) Evaluation of current treatment plan related to  self management and patient's adherence to plan as established by provider Provided therapeutic listening   COPD Interventions:  (Status:  Goal Met.) Long Term Goal-Patient has been provided with Pulmonology contact information Provided education about and advised patient to utilize infection prevention strategies to reduce risk of respiratory infection Assessed social determinant of health barriers Provided education on managing COPD Discussed Pulmonology referral, advised patient to contact [facility] [phone], to schedule consult  Hyperlipidemia:  (Status: Goal Met.) Long Term Goal -scheduled for fasting labs and physical  on 07/01/22 Lab Results  Component Value Date   CHOL 133 04/02/2022   HDL 26 (L) 04/02/2022   LDLCALC 69 04/02/2022   TRIG 231 (H) 04/02/2022   CHOLHDL 5.1 (H) 04/02/2022     Medication review performed; medication list updated in electronic medical record.  Counseled on importance of regular laboratory monitoring as prescribed; Reviewed exercise goals and target of 150 minutes per week; Assessed social determinant of health barriers;  Provided education on heart healthy diet and managing cholesterol Encouraged patient to discuss any concerns or  questions with provider   Patient Goals/Self-Care Activities: Take medications as prescribed   Attend all scheduled provider appointments Call pharmacy for medication refills 3-7 days in advance of running out of medications Attend church or other social activities Perform all self care activities independently  Call provider office for new concerns or questions  - adhere to prescribed diet: heart healthy - develop an exercise routine       Follow Up:  Patient agrees to Care Plan and Follow-up.  Plan: The  Patient has been provided with contact information for the Managed Medicaid care management team and has been advised to call with any health related questions or concerns.  Date/time of next scheduled RN care management/care coordination outreach:  no follow up   Estanislado Emms RN, BSN [place]  Managed [facility] RN Care Coordinator [phone]

## 2022-06-08 NOTE — Patient Instructions (Signed)
Visit Information  Ms. Fullen was given information about Medicaid Managed Care team care coordination services as a part of their [facility] Medicaid benefit. [name] verbally consented to engagement with the [facility] Managed Care team.   If you are experiencing a medical emergency, please call 911 or report to your local emergency department or urgent care.   If you have a non-emergency medical problem during routine business hours, please contact your provider's office and ask to speak with a nurse.   For questions related to your [facility] health plan, please call: [phone] or go here:[email or web address]  If you would like to schedule transportation through your [facility] plan, please call the following number at least 2 days in advance of your appointment: [phone].  You can also use the MTM portal or MTM mobile app to manage your rides. For the portal, please go to [email or web address]  Call the [facility] Crisis Line at [phone], at any time, 24 hours a day, 7 days a week. If you are in danger or need immediate medical attention call 911.  If you would like help to quit smoking, call 1-800-QUIT-NOW (361-036-1486) OR Espaol: 1-855-Djelo-Ya (8-832-549-8264) o para ms informacin haga clic aqu or Text READY to 158-309 to register via text  Ms. Laskowski,   Patient verbalizes understanding of instructions and care plan provided today and agrees to view in MyChart. Active MyChart status and patient understanding of how to access instructions and care plan via MyChart confirmed with patient.      No further follow up required: Patient has bee provided with contact information will follow up with RNCM if new needs arise  Estanislado Emms RN, BSN [place]  Managed [facility] RN Care Coordinator [phone]   Following is a copy of your plan of care:  Care Plan : RN Care Manager Plan of Care  Updates made by Heidi Dach, RN since 06/08/2022  12:00 AM  Completed 06/08/2022   Problem: Chronic health managment needs related to COPD Resolved 06/08/2022     Long-Range Goal: Development of Plan of Care to address health managment needs related to COPD Completed 06/08/2022  Start Date: 01/27/2021  Expected End Date: 06/30/2022  Priority: High  Note:   Current Barriers:  Chronic Disease Management support and education needs related to HLD and COPD  RNCM Clinical Goal(s):  Patient will verbalize understanding of plan for management of HLD as evidenced by patient verbalization and self monitoring activity take all medications exactly as prescribed and will call provider for medication related questions as evidenced by documentation in EMR    attend all scheduled medical appointments: reschedule with PCP and Pulmonology as evidenced by provider documentation in EMR        continue to work with RN Care Manager and/or Social Worker to address care management and care coordination needs related to HLD as evidenced by adherence to CM Team Scheduled appointments     through collaboration with Medical illustrator, provider, and care team.   Interventions: Inter-disciplinary care team collaboration (see longitudinal plan of care) Evaluation of current treatment plan related to  self management and patient's adherence to plan as established by provider Provided therapeutic listening   COPD Interventions:  (Status:  Goal Met.) Long Term Goal-Patient has been provided with Pulmonology contact information Provided education about and advised patient to utilize infection prevention strategies to reduce risk of respiratory infection Assessed social determinant of health barriers Provided education on managing COPD Discussed Pulmonology referral, advised patient to contact  [facility] [phone], to schedule consult  Hyperlipidemia:  (Status: Goal Met.) Long Term Goal -scheduled for fasting labs and physical on 07/01/22 Lab Results  Component  Value Date   CHOL 133 04/02/2022   HDL 26 (L) 04/02/2022   LDLCALC 69 04/02/2022   TRIG 231 (H) 04/02/2022   CHOLHDL 5.1 (H) 04/02/2022     Medication review performed; medication list updated in electronic medical record.  Counseled on importance of regular laboratory monitoring as prescribed; Reviewed exercise goals and target of 150 minutes per week; Assessed social determinant of health barriers;  Provided education on heart healthy diet and managing cholesterol Encouraged patient to discuss any concerns or questions with provider   Patient Goals/Self-Care Activities: Take medications as prescribed   Attend all scheduled provider appointments Call pharmacy for medication refills 3-7 days in advance of running out of medications Attend church or other social activities Perform all self care activities independently  Call provider office for new concerns or questions  - adhere to prescribed diet: heart healthy - develop an exercise routine

## 2022-06-25 ENCOUNTER — Other Ambulatory Visit: Payer: Self-pay | Admitting: Nurse Practitioner

## 2022-06-25 DIAGNOSIS — I341 Nonrheumatic mitral (valve) prolapse: Secondary | ICD-10-CM

## 2022-06-25 DIAGNOSIS — R000 Tachycardia, unspecified: Secondary | ICD-10-CM

## 2022-06-25 NOTE — Telephone Encounter (Signed)
Requested Prescriptions  Pending Prescriptions Disp Refills   metoprolol tartrate (LOPRESSOR) 25 MG tablet [Pharmacy Med Name: metoprolol tartrate 25 mg tablet] 180 tablet 1    Sig: TAKE ONE TABLET BY MOUTH TWICE DAILY     Cardiovascular:  Beta Blockers Passed - 06/25/2022 12:14 PM      Passed - Last BP in normal range    BP Readings from Last 1 Encounters:  04/02/22 107/62         Passed - Last Heart Rate in normal range    Pulse Readings from Last 1 Encounters:  04/02/22 (!) 108         Passed - Valid encounter within last 6 months    Recent Outpatient Visits           2 months ago COPD mixed type (HCC)   [place] [facility] Larae Grooms, NP   6 months ago Cough in adult   [place] [facility] Mecum, Oswaldo Conroy, PA-C   8 months ago COPD mixed type [facility])   [place] [facility] Mecum, Erin E, PA-C   1 year ago Severe episode of recurrent major depressive disorder, without psychotic features (HCC)   [place] [facility] Larae Grooms, NP   1 year ago COPD mixed type [facility])   [place] [facility] Larae Grooms, NP       Future Appointments             In 6 days Larae Grooms, NP [place] [facility]

## 2022-07-01 ENCOUNTER — Encounter: Payer: Medicaid Other | Admitting: Nurse Practitioner

## 2022-07-01 DIAGNOSIS — Z419 Encounter for procedure for purposes other than remedying health state, unspecified: Secondary | ICD-10-CM | POA: Diagnosis not present

## 2022-07-01 NOTE — Progress Notes (Deleted)
There were no vitals taken for this visit.   Subjective:    Patient ID: Karen Foster, female    DOB: 1960/04/14, 62 y.o.   MRN: [id number]  HPI: Karen Foster is a 63 y.o. female presenting on 07/01/2022 for comprehensive medical examination. Current medical complaints include:none  She currently lives with: Menopausal Symptoms: no  Patient states her face looks puffy as well as her lower legs.  She feels like she has gained weight.   COPD COPD status: uncontrolled Satisfied with current treatment?: yes Oxygen use: no Dyspnea frequency:  Cough frequency: yes- currently coughing due to the weather Rescue inhaler frequency:   Limitation of activity: yes Productive cough:  Last Spirometry:  Pneumovax: Up to Date Influenza: Up to Date  Depression Screen done today and results listed below:     04/02/2022    1:57 PM 12/18/2021    2:59 PM 10/21/2021    3:15 PM 05/22/2021    1:08 PM 04/11/2021   10:53 AM  Depression screen PHQ 2/9  Decreased Interest 0 0 1 0 0  Down, Depressed, Hopeless 0 0 0 0 0  PHQ - 2 Score 0 0 1 0 0  Altered sleeping 2 2 1 1 2   Tired, decreased energy 1 1 0 1 1  Change in appetite 0 0 0 0 0  Feeling bad or failure about yourself  0 0 0 0 0  Trouble concentrating 0 0 0 0 0  Moving slowly or fidgety/restless 0 0 0 0 0  Suicidal thoughts 0 0 0 0 0  PHQ-9 Score 3 3 2 2 3   Difficult doing work/chores Not difficult at all Not difficult at all Not difficult at all Not difficult at all Not difficult at all    The patient does not have a history of falls. I did complete a risk assessment for falls. A plan of care for falls was documented.   Past Medical History:  Past Medical History:  Diagnosis Date   Allergy    Anxiety    Arthritis    Depression    GERD (gastroesophageal reflux disease)    Glaucoma    Heart murmur    Kidney stones    Migraines    Panic attack    Panic attack    Urticaria 12/01/2018    Surgical History:  Past Surgical History:   Procedure Laterality Date   FRACTURE SURGERY     JOINT REPLACEMENT  2011   Hip   REVISION TOTAL HIP ARTHROPLASTY  2011   SEPTOPLASTY  2007   TONSILLECTOMY     TOTAL ABDOMINAL HYSTERECTOMY W/ BILATERAL SALPINGOOPHORECTOMY      Medications:  Current Outpatient Medications on File Prior to Visit  Medication Sig   Aspirin-Acetaminophen (GOODYS BODY PAIN PO) Take by mouth.   benzonatate (TESSALON) 200 MG capsule TAKE ONE CAPSULE BY MOUTH TWICE DAILY AS NEEDED FOR COUGH.   budesonide-formoterol (SYMBICORT) 160-4.5 MCG/ACT inhaler Inhale 2 puffs into the lungs 2 (two) times daily.   cyclobenzaprine (FLEXERIL) 10 MG tablet Take 1 tablet (10 mg total) by mouth 3 (three) times daily as needed. for muscle spams   DULoxetine (CYMBALTA) 20 MG capsule Take 1 capsule (20 mg total) by mouth daily.   gabapentin (NEURONTIN) 300 MG capsule Take 1 capsule (300 mg total) by mouth 4 (four) times daily.   metoprolol tartrate (LOPRESSOR) 25 MG tablet TAKE ONE TABLET BY MOUTH TWICE DAILY   montelukast (SINGULAIR) 10 MG tablet Take 1 tablet (10  mg total) by mouth at bedtime.   pantoprazole (PROTONIX) 20 MG tablet Take 1 tablet (20 mg total) by mouth 2 (two) times daily.   QUEtiapine (SEROQUEL) 25 MG tablet Take 2-3 tablets (50-75 mg total) by mouth 2 (two) times daily. Take up to 50 mg (two tablets) in the morning and up to 75 mg ( 3 tablets) at night.   rosuvastatin (CRESTOR) 10 MG tablet Take 1 tablet (10 mg total) by mouth daily.   SPIRIVA RESPIMAT 2.5 MCG/ACT AERS inhale TWO puffs into THE lungs daily   VENTOLIN HFA 108 (90 Base) MCG/ACT inhaler inhale TWO puffs into THE lungs EVERY 6 HOURS AS NEEDED FOR wheezing OR shortness of breath.   No current facility-administered medications on file prior to visit.    Allergies:  Allergies  Allergen Reactions   Bee Venom Anaphylaxis   Ibuprofen Anaphylaxis   Sulfa Antibiotics Anaphylaxis   Tomato Anaphylaxis and Nausea And Vomiting   Hydroxyzine Nausea Only     Social History:  Social History   Socioeconomic History   Marital status: Widowed    Spouse name: Not on file   Number of children: Not on file   Years of education: Not on file   Highest education level: Not on file  Occupational History   Not on file  Tobacco Use   Smoking status: Every Day    Packs/day: 0.30    Years: 40.00    Additional pack years: 0.00    Total pack years: 12.00    Types: Cigarettes   Smokeless tobacco: Never  Vaping Use   Vaping Use: Never used  Substance and Sexual Activity   Alcohol use: No   Drug use: No   Sexual activity: Not Currently  Other Topics Concern   Not on file  Social History Narrative   Not on file   Social Determinants of Health   Financial Resource Strain: Not on file  Food Insecurity: Food Insecurity Present (02/02/2022)   Hunger Vital Sign    Worried About Running Out of Food in the Last Year: Sometimes true    Ran Out of Food in the Last Year: Sometimes true  Transportation Needs: Unmet Transportation Needs (02/02/2022)   PRAPARE - Administrator, Civil Service (Medical): Yes    Lack of Transportation (Non-Medical): No  Physical Activity: Not on file  Stress: Not on file  Social Connections: Moderately Isolated (12/24/2020)   Social Connection and Isolation Panel [NHANES]    Frequency of Communication with Friends and Family: More than three times a week    Frequency of Social Gatherings with Friends and Family: More than three times a week    Attends Religious Services: Never    Database administrator or Organizations: Yes    Attends Engineer, structural: More than 4 times per year    Marital Status: Widowed  Catering manager Violence: Not on file   Social History   Tobacco Use  Smoking Status Every Day   Packs/day: 0.30   Years: 40.00   Additional pack years: 0.00   Total pack years: 12.00   Types: Cigarettes  Smokeless Tobacco Never   Social History   Substance and Sexual Activity   Alcohol Use No    Family History:  Family History  Problem Relation Age of Onset   Kidney failure Mother    Heart attack Mother    Asthma Mother    Kidney disease Mother    Diabetes Mother  Cancer Father    Heart disease Maternal Aunt    Hypertension Maternal Aunt    Cancer Maternal Uncle    Asthma Maternal Uncle    Heart attack Paternal Aunt    Heart attack Paternal Uncle    Diabetes Paternal Uncle    Kidney failure Maternal Grandmother    Multiple myeloma Maternal Grandmother    Other Maternal Grandmother        multiple myeloma   Heart attack Maternal Grandfather    Hyperlipidemia Maternal Grandfather    Diabetes Maternal Grandfather    Prostate cancer Neg Hx    Bladder Cancer Neg Hx     Past medical history, surgical history, medications, allergies, family history and social history reviewed with patient today and changes made to appropriate areas of the chart.   ROS All other ROS negative except what is listed above and in the HPI.      Objective:    There were no vitals taken for this visit.  Wt Readings from Last 3 Encounters:  04/02/22 143 lb 11.2 oz (65.2 kg)  12/18/21 148 lb 6.4 oz (67.3 kg)  10/21/21 147 lb 11.2 oz (67 kg)    Physical Exam Vitals and nursing note reviewed. Exam conducted with a chaperone present Wilhemena Durie, CMA).  Constitutional:      General: She is awake. She is not in acute distress.    Appearance: She is well-developed. She is not ill-appearing.  HENT:     Head: Normocephalic and atraumatic.     Right Ear: Hearing, tympanic membrane, ear canal and external ear normal. No drainage.     Left Ear: Hearing, tympanic membrane, ear canal and external ear normal. No drainage.     Nose: Nose normal.     Right Sinus: No maxillary sinus tenderness or frontal sinus tenderness.     Left Sinus: No maxillary sinus tenderness or frontal sinus tenderness.     Mouth/Throat:     Mouth: Mucous membranes are moist.     Pharynx:  Oropharynx is clear. Uvula midline. No pharyngeal swelling, oropharyngeal exudate or posterior oropharyngeal erythema.  Eyes:     General: Lids are normal.        Right eye: No discharge.        Left eye: No discharge.     Extraocular Movements: Extraocular movements intact.     Conjunctiva/sclera: Conjunctivae normal.     Pupils: Pupils are equal, round, and reactive to light.     Visual Fields: Right eye visual fields normal and left eye visual fields normal.  Neck:     Thyroid: No thyromegaly.     Vascular: No carotid bruit.     Trachea: Trachea normal.  Cardiovascular:     Rate and Rhythm: Normal rate and regular rhythm.     Heart sounds: Normal heart sounds. No murmur heard.    No gallop.  Pulmonary:     Effort: Pulmonary effort is normal. No accessory muscle usage or respiratory distress.     Breath sounds: Normal breath sounds.  Chest:  Breasts:    Right: Normal.     Left: Normal.  Abdominal:     General: Bowel sounds are normal.     Palpations: Abdomen is soft. There is no hepatomegaly or splenomegaly.     Tenderness: There is no abdominal tenderness.  Genitourinary:    Vagina: Normal.     Cervix: Normal.     Adnexa: Right adnexa normal and left adnexa normal.  Musculoskeletal:  General: Normal range of motion.     Cervical back: Normal range of motion and neck supple.     Right lower leg: No edema.     Left lower leg: No edema.  Lymphadenopathy:     Head:     Right side of head: No submental, submandibular, tonsillar, preauricular or posterior auricular adenopathy.     Left side of head: No submental, submandibular, tonsillar, preauricular or posterior auricular adenopathy.     Cervical: No cervical adenopathy.     Upper Body:     Right upper body: No supraclavicular, axillary or pectoral adenopathy.     Left upper body: No supraclavicular, axillary or pectoral adenopathy.  Skin:    General: Skin is warm and dry.     Capillary Refill: Capillary refill  takes less than 2 seconds.     Findings: No rash.  Neurological:     Mental Status: She is alert and oriented to person, place, and time.     Gait: Gait is intact.     Deep Tendon Reflexes: Reflexes are normal and symmetric.     Reflex Scores:      Brachioradialis reflexes are 2+ on the right side and 2+ on the left side.      Patellar reflexes are 2+ on the right side and 2+ on the left side. Psychiatric:        Attention and Perception: Attention normal.        Mood and Affect: Mood normal.        Speech: Speech normal.        Behavior: Behavior normal. Behavior is cooperative.        Thought Content: Thought content normal.        Judgment: Judgment normal.     Results for orders placed or performed in visit on 04/02/22  Comp Met (CMET)  Result Value Ref Range   Glucose 76 70 - 99 mg/dL   BUN 25 8 - 27 mg/dL   Creatinine, Ser 4.09 (H) 0.57 - 1.00 mg/dL   eGFR 53 (L) >81 XB/JYN/8.29   BUN/Creatinine Ratio 21 12 - 28   Sodium 142 134 - 144 mmol/L   Potassium 4.9 3.5 - 5.2 mmol/L   Chloride 107 (H) 96 - 106 mmol/L   CO2 22 20 - 29 mmol/L   Calcium 9.2 8.7 - 10.3 mg/dL   Total Protein 6.8 6.0 - 8.5 g/dL   Albumin 4.5 3.8 - 4.9 g/dL   Globulin, Total 2.3 1.5 - 4.5 g/dL   Albumin/Globulin Ratio 2.0 1.2 - 2.2   Bilirubin Total <0.2 0.0 - 1.2 mg/dL   Alkaline Phosphatase 100 44 - 121 IU/L   AST 13 0 - 40 IU/L   ALT 11 0 - 32 IU/L  Lipid Profile  Result Value Ref Range   Cholesterol, Total 133 100 - 199 mg/dL   Triglycerides 562 (H) 0 - 149 mg/dL   HDL 26 (L) >13 mg/dL   VLDL Cholesterol Cal 38 5 - 40 mg/dL   LDL Chol Calc (NIH) 69 0 - 99 mg/dL   Chol/HDL Ratio 5.1 (H) 0.0 - 4.4 ratio      Assessment & Plan:   Problem List Items Addressed This Visit   None    Follow up plan: No follow-ups on file.   LABORATORY TESTING:  - Pap smear: pap done  IMMUNIZATIONS:   - Tdap: Tetanus vaccination status reviewed: last tetanus booster within 10 years. - Influenza: Up  to date -  Pneumovax: Administered today - Prevnar: Not applicable - COVID: Refused - HPV: Not applicable - Shingrix vaccine:  Discussed at visit  SCREENING: -Mammogram: Ordered today  - Colonoscopy: Refused  - Bone Density: Not applicable  -Hearing Test: Not applicable  -Spirometry: Not applicable   PATIENT COUNSELING:   Advised to take 1 mg of folate supplement per day if capable of pregnancy.   Sexuality: Discussed sexually transmitted diseases, partner selection, use of condoms, avoidance of unintended pregnancy  and contraceptive alternatives.   Advised to avoid cigarette smoking.  I discussed with the patient that most people either abstain from alcohol or drink within safe limits (<=14/week and <=4 drinks/occasion for males, <=7/weeks and <= 3 drinks/occasion for females) and that the risk for alcohol disorders and other health effects rises proportionally with the number of drinks per week and how often a drinker exceeds daily limits.  Discussed cessation/primary prevention of drug use and availability of treatment for abuse.   Diet: Encouraged to adjust caloric intake to maintain  or achieve ideal body weight, to reduce intake of dietary saturated fat and total fat, to limit sodium intake by avoiding high sodium foods and not adding table salt, and to maintain adequate dietary potassium and calcium preferably from fresh fruits, vegetables, and low-fat dairy products.    stressed the importance of regular exercise  Injury prevention: Discussed safety belts, safety helmets, smoke detector, smoking near bedding or upholstery.   Dental health: Discussed importance of regular tooth brushing, flossing, and dental visits.    NEXT PREVENTATIVE PHYSICAL DUE IN 1 YEAR. No follow-ups on file.

## 2022-07-09 ENCOUNTER — Other Ambulatory Visit: Payer: Self-pay | Admitting: Nurse Practitioner

## 2022-07-09 DIAGNOSIS — R059 Cough, unspecified: Secondary | ICD-10-CM

## 2022-07-09 NOTE — Telephone Encounter (Signed)
Requested Prescriptions  Pending Prescriptions Disp Refills   benzonatate (TESSALON) 200 MG capsule [Pharmacy Med Name: benzonatate 200 mg capsule] 60 capsule 2    Sig: TAKE ONE CAPSULE BY MOUTH TWICE DAILY AS NEEDED FOR COUGH.     Ear, Nose, and Throat:  Antitussives/Expectorants Passed - 07/09/2022 10:07 AM      Passed - Valid encounter within last 12 months    Recent Outpatient Visits           3 months ago COPD mixed type [facility])   [facility] [facility] Larae Grooms, NP   6 months ago Cough in adult   [place] [facility] Mecum, Oswaldo Conroy, PA-C   8 months ago COPD mixed type [facility])   [place] [facility] Mecum, Oswaldo Conroy, PA-C   1 year ago Severe episode of recurrent major depressive disorder, without psychotic features (HCC)   [place] [facility] Larae Grooms, NP   1 year ago COPD mixed type [facility])   [place] [facility] Larae Grooms, NP

## 2022-07-28 ENCOUNTER — Other Ambulatory Visit: Payer: Self-pay | Admitting: Nurse Practitioner

## 2022-07-28 DIAGNOSIS — J449 Chronic obstructive pulmonary disease, unspecified: Secondary | ICD-10-CM

## 2022-07-29 NOTE — Telephone Encounter (Signed)
Requested Prescriptions  Pending Prescriptions Disp Refills   VENTOLIN HFA 108 (90 Base) MCG/ACT inhaler [Pharmacy Med Name: Ventolin HFA 90 mcg/actuation aerosol inhaler] 18 g 1    Sig: inhale TWO puffs into THE lungs EVERY 6 HOURS AS NEEDED FOR wheezing OR shortness of breath.     Pulmonology:  Beta Agonists 2 Passed - 07/28/2022 12:51 PM      Passed - Last BP in normal range    BP Readings from Last 1 Encounters:  04/02/22 107/62         Passed - Last Heart Rate in normal range    Pulse Readings from Last 1 Encounters:  04/02/22 (!) 108         Passed - Valid encounter within last 12 months    Recent Outpatient Visits           3 months ago COPD mixed type (HCC)   [place] [facility] Larae Grooms, NP   7 months ago Cough in adult   [place] [facility] Mecum, Oswaldo Conroy, PA-C   9 months ago COPD mixed type [facility])   [place] [facility] Mecum, Erin E, PA-C   1 year ago Severe episode of recurrent major depressive disorder, without psychotic features (HCC)   [place] [facility] Larae Grooms, NP   1 year ago COPD mixed type [facility])   [place] [facility] Remy, Clydie Braun, NP               SPIRIVA RESPIMAT 2.5 MCG/ACT AERS [Pharmacy Med Name: Spiriva Respimat 2.5 mcg/actuation solution for inhalation] 4 g 1    Sig: inhale TWO puffs into THE lungs daily     Pulmonology:  Anticholinergic Agents Passed - 07/28/2022 12:51 PM      Passed - Valid encounter within last 12 months    Recent Outpatient Visits           3 months ago COPD mixed type [facility])   [place] [facility] Larae Grooms, NP   7 months ago Cough in adult   [place] [facility] Mecum, Oswaldo Conroy, PA-C   9 months ago COPD mixed type [facility])   [place] [facility] Mecum, Erin E, PA-C   1 year ago Severe episode of recurrent major depressive disorder, without psychotic features  (HCC)   [place] [facility] Larae Grooms, NP   1 year ago COPD mixed type [facility])   [place] [facility] Larae Grooms, NP               SYMBICORT 160-4.5 MCG/ACT inhaler [Pharmacy Med Name: Symbicort 160 mcg-4.5 mcg/actuation HFA aerosol inhaler] 10.2 g 3    Sig: inhale TWO puffs into THE lungs TWICE DAILY     Pulmonology:  Combination Products Passed - 07/28/2022 12:51 PM      Passed - Valid encounter within last 12 months    Recent Outpatient Visits           3 months ago COPD mixed type [facility])   [place] [facility] Larae Grooms, NP   7 months ago Cough in adult   [place] [facility] Mecum, Oswaldo Conroy, PA-C   9 months ago COPD mixed type [facility])   [place] [facility] Mecum, Erin E, PA-C   1 year ago Severe episode of recurrent major depressive disorder, without psychotic features (HCC)   [place] [facility]  Larae Grooms, NP   1 year ago COPD mixed type [facility])   [place] [facility] Larae Grooms, NP

## 2022-08-01 DIAGNOSIS — Z419 Encounter for procedure for purposes other than remedying health state, unspecified: Secondary | ICD-10-CM | POA: Diagnosis not present

## 2022-08-31 DIAGNOSIS — Z419 Encounter for procedure for purposes other than remedying health state, unspecified: Secondary | ICD-10-CM | POA: Diagnosis not present

## 2022-09-02 ENCOUNTER — Telehealth: Payer: Self-pay

## 2022-09-02 NOTE — Telephone Encounter (Signed)
Chart review completed for patient. Patient is due for screening mammogram. Mychart message sent to patient to inquire about scheduling mammogram.  Ellyce Lafevers, Population Health Specialist.  

## 2022-10-01 DIAGNOSIS — Z419 Encounter for procedure for purposes other than remedying health state, unspecified: Secondary | ICD-10-CM | POA: Diagnosis not present

## 2022-10-02 ENCOUNTER — Other Ambulatory Visit: Payer: Self-pay | Admitting: Nurse Practitioner

## 2022-10-02 NOTE — Telephone Encounter (Signed)
Requested Prescriptions  Pending Prescriptions Disp Refills   rosuvastatin (CRESTOR) 10 MG tablet [Pharmacy Med Name: rosuvastatin 10 mg tablet] 90 tablet 0    Sig: TAKE ONE TABLET BY MOUTH DAILY     Cardiovascular:  Antilipid - Statins 2 Failed - 10/02/2022  8:00 AM      Failed - Cr in normal range and within 360 days    Creatinine, Ser  Date Value Ref Range Status  04/02/2022 1.18 (H) 0.57 - 1.00 mg/dL Final         Failed - Lipid Panel in normal range within the last 12 months    Cholesterol, Total  Date Value Ref Range Status  04/02/2022 133 100 - 199 mg/dL Final   LDL Chol Calc (NIH)  Date Value Ref Range Status  04/02/2022 69 0 - 99 mg/dL Final   HDL  Date Value Ref Range Status  04/02/2022 26 (L) >39 mg/dL Final   Triglycerides  Date Value Ref Range Status  04/02/2022 231 (H) 0 - 149 mg/dL Final         Passed - Patient is not pregnant      Passed - Valid encounter within last 12 months    Recent Outpatient Visits           6 months ago COPD mixed type [facility])   [place] [facility] Larae Grooms, NP   9 months ago Cough in adult   [place] [facility] Mecum, Oswaldo Conroy, PA-C   11 months ago COPD mixed type [facility])   [place] [facility] Mecum, Erin E, PA-C   1 year ago Severe episode of recurrent major depressive disorder, without psychotic features (HCC)   [place] [facility] Larae Grooms, NP   1 year ago COPD mixed type [facility])   [place] [facility] Larae Grooms, NP

## 2022-10-06 ENCOUNTER — Other Ambulatory Visit: Payer: Self-pay | Admitting: Nurse Practitioner

## 2022-10-06 ENCOUNTER — Telehealth: Payer: Self-pay | Admitting: Nurse Practitioner

## 2022-10-06 DIAGNOSIS — R059 Cough, unspecified: Secondary | ICD-10-CM

## 2022-10-06 NOTE — Telephone Encounter (Signed)
Called to schedule patient her physical, she states she currently out of town and will be back during the week of August 26th.  Scheduled her an appointment on October 27, 2022 @ 1:20 pm.  Pt is requesting a refill on Benzonatate (Tessalon) cough pearls as she states she really needs them.  Can we send in enough until her appointment on the 10/27/2022?  Please advise.

## 2022-10-07 ENCOUNTER — Other Ambulatory Visit: Payer: Self-pay | Admitting: Physician Assistant

## 2022-10-08 ENCOUNTER — Other Ambulatory Visit: Payer: Self-pay | Admitting: Nurse Practitioner

## 2022-10-08 DIAGNOSIS — G8929 Other chronic pain: Secondary | ICD-10-CM

## 2022-10-09 NOTE — Telephone Encounter (Signed)
Requested Prescriptions  Pending Prescriptions Disp Refills   gabapentin (NEURONTIN) 300 MG capsule [Pharmacy Med Name: gabapentin 300 mg capsule] 120 capsule 2    Sig: TAKE ONE CAPSULE BY MOUTH FOUR TIMES DAILY     Neurology: Anticonvulsants - gabapentin Failed - 10/08/2022  8:49 AM      Failed - Cr in normal range and within 360 days    Creatinine, Ser  Date Value Ref Range Status  04/02/2022 1.18 (H) 0.57 - 1.00 mg/dL Final         Passed - Completed PHQ-2 or PHQ-9 in the last 360 days      Passed - Valid encounter within last 12 months    Recent Outpatient Visits           6 months ago COPD mixed type [facility])   [place] [facility] Larae Grooms, NP   9 months ago Cough in adult   [place] [facility] Mecum, Oswaldo Conroy, PA-C   11 months ago COPD mixed type [facility])   [place] [facility] Mecum, Erin E, PA-C   1 year ago Severe episode of recurrent major depressive disorder, without psychotic features (HCC)   [place] [facility] Larae Grooms, NP   1 year ago COPD mixed type [facility])   [place] [facility] Larae Grooms, NP       Future Appointments             In 2 weeks Mecum, Oswaldo Conroy, PA-C [place] [facility]

## 2022-10-23 ENCOUNTER — Other Ambulatory Visit: Payer: Self-pay | Admitting: Nurse Practitioner

## 2022-10-23 DIAGNOSIS — K219 Gastro-esophageal reflux disease without esophagitis: Secondary | ICD-10-CM

## 2022-10-23 NOTE — Telephone Encounter (Signed)
Requested Prescriptions  Pending Prescriptions Disp Refills   pantoprazole (PROTONIX) 20 MG tablet [Pharmacy Med Name: pantoprazole 20 mg tablet,delayed release] 180 tablet 0    Sig: TAKE ONE TABLET BY MOUTH TWICE DAILY.     Gastroenterology: Proton Pump Inhibitors Passed - 10/23/2022  8:00 AM      Passed - Valid encounter within last 12 months    Recent Outpatient Visits           6 months ago COPD mixed type [facility])   [facility], NP   10 months ago Cough in adult   [place] [facility] Mecum, Oswaldo Conroy, PA-C   1 year ago COPD mixed type [facility])   [place] [facility] Mecum, Oswaldo Conroy, PA-C   1 year ago Severe episode of recurrent major depressive disorder, without psychotic features (HCC)   [place] [facility] Larae Grooms, NP   1 year ago COPD mixed type [facility])   [place] [facility] Larae Grooms, NP       Future Appointments             In 4 days Mecum, Oswaldo Conroy, PA-C [place] [facility]

## 2022-10-27 ENCOUNTER — Ambulatory Visit (INDEPENDENT_AMBULATORY_CARE_PROVIDER_SITE_OTHER): Payer: Medicaid Other | Admitting: Physician Assistant

## 2022-10-27 VITALS — BP 131/82 | HR 97 | Temp 99.1°F | Ht 65.0 in | Wt 151.0 lb

## 2022-10-27 DIAGNOSIS — R059 Cough, unspecified: Secondary | ICD-10-CM

## 2022-10-27 DIAGNOSIS — Z7189 Other specified counseling: Secondary | ICD-10-CM

## 2022-10-27 DIAGNOSIS — F332 Major depressive disorder, recurrent severe without psychotic features: Secondary | ICD-10-CM

## 2022-10-27 DIAGNOSIS — Z1231 Encounter for screening mammogram for malignant neoplasm of breast: Secondary | ICD-10-CM

## 2022-10-27 DIAGNOSIS — Z0000 Encounter for general adult medical examination without abnormal findings: Secondary | ICD-10-CM

## 2022-10-27 DIAGNOSIS — G8929 Other chronic pain: Secondary | ICD-10-CM

## 2022-10-27 DIAGNOSIS — J449 Chronic obstructive pulmonary disease, unspecified: Secondary | ICD-10-CM

## 2022-10-27 DIAGNOSIS — R000 Tachycardia, unspecified: Secondary | ICD-10-CM

## 2022-10-27 DIAGNOSIS — E782 Mixed hyperlipidemia: Secondary | ICD-10-CM

## 2022-10-27 DIAGNOSIS — F17210 Nicotine dependence, cigarettes, uncomplicated: Secondary | ICD-10-CM | POA: Diagnosis not present

## 2022-10-27 DIAGNOSIS — K219 Gastro-esophageal reflux disease without esophagitis: Secondary | ICD-10-CM

## 2022-10-27 DIAGNOSIS — Z1211 Encounter for screening for malignant neoplasm of colon: Secondary | ICD-10-CM

## 2022-10-27 DIAGNOSIS — I341 Nonrheumatic mitral (valve) prolapse: Secondary | ICD-10-CM

## 2022-10-27 MED ORDER — BUDESONIDE-FORMOTEROL FUMARATE 160-4.5 MCG/ACT IN AERO
2.0000 | INHALATION_SPRAY | Freq: Two times a day (BID) | RESPIRATORY_TRACT | 3 refills | Status: DC
Start: 2022-10-27 — End: 2023-03-15

## 2022-10-27 MED ORDER — VENTOLIN HFA 108 (90 BASE) MCG/ACT IN AERS
2.0000 | INHALATION_SPRAY | Freq: Four times a day (QID) | RESPIRATORY_TRACT | 1 refills | Status: DC | PRN
Start: 2022-10-27 — End: 2022-12-29

## 2022-10-27 MED ORDER — CYCLOBENZAPRINE HCL 10 MG PO TABS
10.0000 mg | ORAL_TABLET | Freq: Three times a day (TID) | ORAL | 1 refills | Status: AC | PRN
Start: 2022-10-27 — End: ?

## 2022-10-27 MED ORDER — DULOXETINE HCL 20 MG PO CPEP
20.0000 mg | ORAL_CAPSULE | Freq: Every day | ORAL | 1 refills | Status: DC
Start: 2022-10-27 — End: 2023-03-15

## 2022-10-27 MED ORDER — SPIRIVA RESPIMAT 2.5 MCG/ACT IN AERS
2.0000 | INHALATION_SPRAY | Freq: Every day | RESPIRATORY_TRACT | 1 refills | Status: DC
Start: 2022-10-27 — End: 2022-12-29

## 2022-10-27 MED ORDER — MONTELUKAST SODIUM 10 MG PO TABS
10.0000 mg | ORAL_TABLET | Freq: Every day | ORAL | 1 refills | Status: DC
Start: 2022-10-27 — End: 2023-03-15

## 2022-10-27 MED ORDER — GUAIFENESIN-CODEINE 100-6.33 MG/5ML PO SOLN: 5.0000 mL | Freq: Two times a day (BID) | ORAL | 0 refills | Status: DC | PRN

## 2022-10-27 MED ORDER — QUETIAPINE FUMARATE 25 MG PO TABS
50.0000 mg | ORAL_TABLET | Freq: Two times a day (BID) | ORAL | 4 refills | Status: DC
Start: 2022-10-27 — End: 2023-03-15

## 2022-10-27 MED ORDER — METOPROLOL TARTRATE 25 MG PO TABS
25.0000 mg | ORAL_TABLET | Freq: Two times a day (BID) | ORAL | 1 refills | Status: DC
Start: 2022-10-27 — End: 2023-03-15

## 2022-10-27 MED ORDER — BENZONATATE 200 MG PO CAPS
200.0000 mg | ORAL_CAPSULE | Freq: Two times a day (BID) | ORAL | 0 refills | Status: DC | PRN
Start: 2022-10-27 — End: 2022-12-29

## 2022-10-27 NOTE — Assessment & Plan Note (Signed)
Chronic, historic condition Appears well managed at this time with Metoprolol 25 mg PO BID  She is followed by Cardiology- continue collaboration  Continue current regimen- refills provided today  Follow up in 6 months or sooner if concerns arise

## 2022-10-27 NOTE — Assessment & Plan Note (Signed)
Chronic, historic condition  Reports breathing is usually well controlled but has coughing at this time of the year due to dust and pollen  She is taking Spiriva and Symbicort as directed- refills provided today She is taking Singulair and is requesting codeine cough syrup to assist with coughing as Tessalon pearls are not providing adequate relief PDMP reviewed, no evidence of worrisome controlled substance use patterns at this time  Will provide script for cough syrup per request Refills provided today for inhalers and Tessalon as well Continue current regimen Follow up in 3 months or sooner if concerns arise

## 2022-10-27 NOTE — Assessment & Plan Note (Signed)
Chronic, ongoing Will place referral for lung cancer screening  Recommend she continues to decrease cigarette use to discontinuation Follow up as needed for persistent or progressing symptoms

## 2022-10-27 NOTE — Assessment & Plan Note (Signed)
Chronic, historic condition Appears well managed with Protonix Continue medication and avoid trigger foods Follow up in 3 months for monitoring and medication management

## 2022-10-27 NOTE — Assessment & Plan Note (Signed)
A voluntary discussion about advance care planning including the explanation and discussion of advance directives was extensively discussed  with the patient for 5 minutes with patient and myself present.  Explanation about the health care proxy and Living will was reviewed and packet with forms with explanation of how to fill them out was given.  During this discussion, the patient was not able to identify a health care proxy and plans to fill out the paperwork required.  Patient was offered a separate Advance Care Planning visit for further assistance with forms.

## 2022-10-27 NOTE — Assessment & Plan Note (Signed)
Chronic.  Controlled.  Continue with current medication regimen of Crestor 5mg  daily.  Refills sent today.  Labs ordered today.  Return to clinic in 6 months for reevaluation.  Call sooner if concerns arise.  ? ?

## 2022-10-27 NOTE — Assessment & Plan Note (Signed)
Chronic, historic condition  Appears well controlled with flexeril 10 mg PO TID PRN, Gabapentin 300 mg PO QID, and Duloxetine 20 mg PO QD Continue current medications- refills provided today  Follow up in 3 months for monitoring and routine management

## 2022-10-27 NOTE — Patient Instructions (Signed)
Please call [facility] to schedule your mammogram

## 2022-10-27 NOTE — Assessment & Plan Note (Addendum)
Chronic, historic condition,  Currently taking Duloxetine 20 mg PO QD and Quetiapine 25 mg PO 2 up to 2 tablets in AM and up to 3 tablets at bedtime She reports anxiety has decreased but she is still having sleep issues - particularly sleep maintenance May need to discuss further at follow up - may need to increase Duloxetine to assist further with anxious thoughts at night Continue current medications for now - refills provided  Follow up in 3 months for monitoring and medication management

## 2022-10-27 NOTE — Progress Notes (Signed)
Annual Physical Exam   Name: Karen Foster   MRN: [id number]    DOB: [birth year]   Date:10/27/2022  Today's Provider: Jacquelin Hawking, MHS, PA-C Introduced myself to the patient as a PA-C and provided education on APPs in clinical practice.         Subjective  Chief Complaint  Chief Complaint  Patient presents with   Annual Exam    Pt is fasting    HPI  Patient presents for annual CPE.  Diet: overall normal diet.  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 She uses Gabapentin and flexeril at night but reports she wakes up still. She is also taking Seroquel 50 mg at night  Mood: reports her mood has been pretty good the past few months. Reports good contacts with customers and satisfaction with this.   Flowsheet Row Patient Outreach Telephone from 05/14/2021 in [facility]  AUDIT-C Score 0      Depression: Phq 9 is  negative    10/27/2022    1:38 PM 04/02/2022    1:57 PM 12/18/2021    2:59 PM 10/21/2021    3:15 PM 05/22/2021    1:08 PM  Depression screen PHQ 2/9  Decreased Interest 0 0 0 1 0  Down, Depressed, Hopeless 1 0 0 0 0  PHQ - 2 Score 1 0 0 1 0  Altered sleeping 2 2 2 1 1   Tired, decreased energy 1 1 1  0 1  Change in appetite 0 0 0 0 0  Feeling bad or failure about yourself  0 0 0 0 0  Trouble concentrating 0 0 0 0 0  Moving slowly or fidgety/restless 0 0 0 0 0  Suicidal thoughts 0 0 0 0 0  PHQ-9 Score 4 3 3 2 2   Difficult doing work/chores  Not difficult at all Not difficult at all Not difficult at all Not difficult at all   Hypertension: BP Readings from Last 3 Encounters:  10/27/22 131/82  04/02/22 107/62  12/24/21 130/74   Obesity: Wt Readings from Last 3 Encounters:  10/27/22 151 lb (68.5 kg)  04/02/22 143 lb 11.2 oz (65.2 kg)  12/18/21 148 lb 6.4 oz (67.3 kg)   BMI  Readings from Last 3 Encounters:  10/27/22 25.13 kg/m  04/02/22 23.54 kg/m  12/18/21 24.31 kg/m     Vaccines:   HPV: aged out  Tdap: UTD - next due in 2026 Shingrix: Overdue - offered today  Pneumonia: due - offered today  Flu: postponed until fall  COVID-19: Discussed vaccine and booster recommendations per available CDC guidelines  Hep C Screening: completed  HIV screening: completed  STD testing and prevention (HIV/chl/gon/syphilis): declines today  Intimate partner violence: positive, not active  Sexual History: She is not sexually active  Menstrual History/LMP/Abnormal Bleeding: She had a hysterectomy in 1995 Discussed importance of follow up if any post-menopausal bleeding: NA  Incontinence Symptoms: No.  Breast cancer hx:  - Last Mammogram: Overdue- discussed getting this scheduled soon for screening  - BRCA gene screening:   Osteoporosis Prevention : Discussed high calcium and vitamin D supplementation, weight bearing exercises Bone density :not  applicable  Cervical cancer screening: no longer screening   Skin cancer: Discussed monitoring for atypical lesions  Colorectal cancer screening: Discussed importance of regular screening for colon cancer, Cologuard ordered today    Lung cancer:  Low Dose CT Chest recommended if Age 34-80 years, 20 pack-year currently smoking OR have quit w/in 15years. Patient does qualify.   ECG: NA  Advanced Care Planning: A voluntary discussion about advance care planning including the explanation and discussion of advance directives.  Discussed health care proxy and Living will, and the patient was able to identify a health care proxy as no one.  Patient does not have a living will in effect.  Lipids: Lab Results  Component Value Date   CHOL 133 04/02/2022   CHOL 137 02/14/2021   CHOL 237 (H) 12/25/2020   Lab Results  Component Value Date   HDL 26 (L) 04/02/2022   HDL 22 (L) 02/14/2021   HDL 33 (L) 12/25/2020   Lab Results  Component Value Date   LDLCALC 69 04/02/2022   LDLCALC 46 02/14/2021   LDLCALC 160 (H) 12/25/2020   Lab Results  Component Value Date   TRIG 231 (H) 04/02/2022   TRIG 470 (H) 02/14/2021   TRIG 235 (H) 12/25/2020   Lab Results  Component Value Date   CHOLHDL 5.1 (H) 04/02/2022   CHOLHDL 6.2 (H) 02/14/2021   CHOLHDL 7.2 (H) 12/25/2020   No results found for: "LDLDIRECT"  Glucose: Glucose  Date Value Ref Range Status  04/02/2022 76 70 - 99 mg/dL Final  16/12/9602 95 70 - 99 mg/dL Final  54/10/8117 147 (H) 70 - 99 mg/dL Final   Glucose, Bld  Date Value Ref Range Status  02/10/2016 107 (H) 65 - 99 mg/dL Final  82/95/6213 086 (H) 65 - 99 mg/dL Final    Patient Active Problem List   Diagnosis Date Noted   Advanced care planning/counseling discussion 10/27/2022   Mixed hyperlipidemia 04/02/2022   Cough in adult 12/19/2021   Vaginal dryness 12/19/2021   Gastroesophageal reflux disease 10/21/2021   Tachycardia 10/21/2021   Mitral valve prolapse 10/21/2021    Other chronic pain 12/25/2020   Allergic rhinitis 05/26/2019   COPD mixed type (HCC) 12/01/2018   Cigarette smoker 06/08/2018   Arthritis 02/19/2018   Hemorrhoids 02/18/2018   Reactive airway disease 02/18/2018   Migraines 12/12/2017   Deviated septum 12/03/2017   Heart murmur 12/03/2017   Depression 12/03/2017   Panic disorder     Past Surgical History:  Procedure Laterality Date   FRACTURE SURGERY     JOINT REPLACEMENT  2011   Hip   REVISION TOTAL HIP ARTHROPLASTY  2011   SEPTOPLASTY  2007   TONSILLECTOMY     TOTAL ABDOMINAL HYSTERECTOMY W/ BILATERAL SALPINGOOPHORECTOMY      Family History  Problem Relation Age of Onset   Kidney failure Mother    Heart attack Mother    Asthma Mother    Kidney  disease Mother    Diabetes Mother    Cancer Father    Heart disease Maternal Aunt    Hypertension Maternal Aunt    Cancer Maternal Uncle    Asthma Maternal Uncle    Heart attack Paternal Aunt    Heart attack Paternal Uncle    Diabetes Paternal Uncle    Kidney failure Maternal Grandmother    Multiple myeloma Maternal Grandmother    Other Maternal Grandmother        multiple myeloma   Heart attack Maternal Grandfather    Hyperlipidemia Maternal Grandfather    Diabetes Maternal Grandfather    Prostate cancer Neg Hx    Bladder Cancer Neg Hx     Social History   Socioeconomic History   Marital status: Widowed    Spouse name: Not on file   Number of children: Not on file   Years of education: Not on file   Highest education level: Bachelor's degree (e.g., BA, AB, BS)  Occupational History   Not on file  Tobacco Use   Smoking status: Every Day    Current packs/day: 0.30    Average packs/day: 0.3 packs/day for 40.0 years (12.0 ttl pk-yrs)    Types: Cigarettes   Smokeless tobacco: Never  Vaping Use   Vaping status: Never Used  Substance and Sexual Activity   Alcohol use: No   Drug use: No   Sexual activity: Not Currently  Other Topics Concern   Not on file   Social History Narrative   Not on file   Social Determinants of Health   Financial Resource Strain: Low Risk  (10/27/2022)   Overall Financial Resource Strain (CARDIA)    Difficulty of Paying Living Expenses: Not very hard  Food Insecurity: No Food Insecurity (10/27/2022)   Hunger Vital Sign    Worried About Running Out of Food in the Last Year: Never true    Ran Out of Food in the Last Year: Never true  Transportation Needs: No Transportation Needs (10/27/2022)   PRAPARE - Administrator, Civil Service (Medical): No    Lack of Transportation (Non-Medical): No  Physical Activity: Sufficiently Active (10/27/2022)   Exercise Vital Sign    Days of Exercise per Week: 6 days    Minutes of Exercise per Session: 60 min  Stress: Stress Concern Present (10/27/2022)   [employer] of Occupational Health - Occupational Stress Questionnaire    Feeling of Stress : To some extent  Social Connections: Moderately Isolated (10/27/2022)   Social Connection and Isolation Panel [NHANES]    Frequency of Communication with Friends and Family: Three times a week    Frequency of Social Gatherings with Friends and Family: Three times a week    Attends Religious Services: 1 to 4 times per year    Active Member of Clubs or Organizations: No    Attends Banker Meetings: Not on file    Marital Status: Widowed  Intimate Partner Violence: Unknown (09/17/2021)   [facility]   HITS    Physically Hurt: Not on file    Insult or Talk Down To: Not on file    Threaten Physical Harm: Not on file    Scream or Curse: Not on file     Current Outpatient Medications:    guaiFENesin-Codeine 100-6.33 MG/5ML SOLN, Take 5 mLs by mouth 2 (two) times daily as needed., Disp: 200 mL, Rfl: 0   Aspirin-Acetaminophen (GOODYS BODY PAIN PO), Take by mouth., Disp: ,  Rfl:    benzonatate (TESSALON) 200 MG capsule, Take 1 capsule (200 mg total) by mouth 2 (two) times daily as  needed for cough., Disp: 60 capsule, Rfl: 0   budesonide-formoterol (SYMBICORT) 160-4.5 MCG/ACT inhaler, Inhale 2 puffs into the lungs in the morning and at bedtime., Disp: 10.2 g, Rfl: 3   cyclobenzaprine (FLEXERIL) 10 MG tablet, Take 1 tablet (10 mg total) by mouth 3 (three) times daily as needed. for muscle spams, Disp: 360 tablet, Rfl: 1   DULoxetine (CYMBALTA) 20 MG capsule, Take 1 capsule (20 mg total) by mouth daily., Disp: 90 capsule, Rfl: 1   gabapentin (NEURONTIN) 300 MG capsule, TAKE ONE CAPSULE BY MOUTH FOUR TIMES DAILY, Disp: 120 capsule, Rfl: 2   metoprolol tartrate (LOPRESSOR) 25 MG tablet, Take 1 tablet (25 mg total) by mouth 2 (two) times daily., Disp: 180 tablet, Rfl: 1   montelukast (SINGULAIR) 10 MG tablet, Take 1 tablet (10 mg total) by mouth at bedtime., Disp: 90 tablet, Rfl: 1   pantoprazole (PROTONIX) 20 MG tablet, TAKE ONE TABLET BY MOUTH TWICE DAILY., Disp: 180 tablet, Rfl: 0   QUEtiapine (SEROQUEL) 25 MG tablet, Take 2-3 tablets (50-75 mg total) by mouth 2 (two) times daily. Take up to 50 mg (two tablets) in the morning and up to 75 mg ( 3 tablets) at night., Disp: 180 tablet, Rfl: 4   rosuvastatin (CRESTOR) 10 MG tablet, TAKE ONE TABLET BY MOUTH DAILY, Disp: 90 tablet, Rfl: 0   Tiotropium Bromide Monohydrate (SPIRIVA RESPIMAT) 2.5 MCG/ACT AERS, Inhale 2 puffs into the lungs daily., Disp: 4 g, Rfl: 1   VENTOLIN HFA 108 (90 Base) MCG/ACT inhaler, Inhale 2 puffs into the lungs every 6 (six) hours as needed for wheezing or shortness of breath., Disp: 18 g, Rfl: 1  Allergies  Allergen Reactions   Bee Venom Anaphylaxis   Ibuprofen Anaphylaxis   Sulfa Antibiotics Anaphylaxis   Tomato Anaphylaxis and Nausea And Vomiting   Hydroxyzine Nausea Only     Review of Systems  Constitutional:  Negative for chills, fever and weight loss.  HENT:  Negative for hearing loss, nosebleeds, sore throat and tinnitus.   Eyes:  Negative for blurred vision, double vision and photophobia.   Respiratory:  Negative for shortness of breath and wheezing.   Cardiovascular:  Negative for chest pain, palpitations and leg swelling.  Gastrointestinal:  Positive for heartburn. Negative for blood in stool, constipation, diarrhea, nausea and vomiting.  Genitourinary:  Negative for dysuria.  Musculoskeletal:  Negative for falls.  Skin:  Negative for itching and rash.  Neurological:  Negative for dizziness, tingling, tremors, loss of consciousness, weakness and headaches.  Psychiatric/Behavioral:  Negative for depression. The patient has insomnia. The patient is not nervous/anxious.       Objective  Vitals:   10/27/22 1309  BP: 131/82  Pulse: 97  Temp: 99.1 F (37.3 C)  SpO2: 95%  Weight: 151 lb (68.5 kg)  Height: 5\' 5"  (1.651 m)    Body mass index is 25.13 kg/m.  Physical Exam Vitals reviewed.  Constitutional:      General: She is awake.     Appearance: Normal appearance. She is well-developed and well-groomed.  HENT:     Head: Normocephalic and atraumatic.     Right Ear: Hearing, tympanic membrane and ear canal normal.     Left Ear: Hearing, tympanic membrane and ear canal normal.     Mouth/Throat:     Lips: Pink.     Mouth: Mucous membranes are  moist.     Pharynx: No pharyngeal swelling, oropharyngeal exudate, posterior oropharyngeal erythema, uvula swelling or postnasal drip.  Eyes:     General: Lids are normal. Gaze aligned appropriately.     Extraocular Movements: Extraocular movements intact.     Conjunctiva/sclera: Conjunctivae normal.     Pupils: Pupils are equal, round, and reactive to light.  Neck:     Thyroid: No thyroid mass, thyromegaly or thyroid tenderness.  Cardiovascular:     Rate and Rhythm: Normal rate and regular rhythm.     Pulses: Normal pulses.          Radial pulses are 2+ on the right side and 2+ on the left side.     Heart sounds: Normal heart sounds. No murmur heard.    No friction rub. No gallop.  Pulmonary:     Effort: Pulmonary  effort is normal.     Breath sounds: Decreased air movement present. No decreased breath sounds, wheezing, rhonchi or rales.  Abdominal:     General: Abdomen is flat. Bowel sounds are normal.     Palpations: Abdomen is soft.     Tenderness: There is no abdominal tenderness.  Musculoskeletal:     Cervical back: Normal range of motion and neck supple.     Right lower leg: No edema.     Left lower leg: No edema.  Lymphadenopathy:     Head:     Right side of head: No submental, submandibular or preauricular adenopathy.     Left side of head: No submental, submandibular or preauricular adenopathy.     Cervical:     Right cervical: No superficial or posterior cervical adenopathy.    Left cervical: No superficial or posterior cervical adenopathy.     Upper Body:     Right upper body: No supraclavicular adenopathy.     Left upper body: No supraclavicular adenopathy.  Skin:    General: Skin is warm and dry.  Neurological:     General: No focal deficit present.     Mental Status: She is alert and oriented to person, place, and time.     GCS: GCS eye subscore is 4. GCS verbal subscore is 5. GCS motor subscore is 6.     Cranial Nerves: No cranial nerve deficit, dysarthria or facial asymmetry.     Motor: No weakness, tremor, atrophy or abnormal muscle tone.  Psychiatric:        Attention and Perception: Attention and perception normal.        Mood and Affect: Mood and affect normal.        Speech: Speech normal.        Behavior: Behavior normal. Behavior is cooperative.        Thought Content: Thought content normal.        Cognition and Memory: Cognition normal.        Judgment: Judgment normal.      No results found for this or any previous visit (from the past 2160 hour(s)).   Fall Risk:    10/27/2022    1:38 PM 04/02/2022    1:57 PM 12/18/2021    2:59 PM 10/21/2021    3:13 PM 05/22/2021    1:08 PM  Fall Risk   Falls in the past year? 0 1 1 0 0  Number falls in past yr: 0 0 1 0  0  Injury with Fall? 0 0 1 0 0  Risk for fall due to : No Fall Risks No Fall Risks  History of fall(s) No Fall Risks No Fall Risks  Follow up Falls evaluation completed Falls evaluation completed Falls evaluation completed Falls evaluation completed Falls evaluation completed     Functional Status Survey:     Assessment & Plan  Problem List Items Addressed This Visit       Cardiovascular and Mediastinum   Mitral valve prolapse    Chronic, historic condition Appears well managed at this time with Metoprolol 25 mg PO BID  She is followed by Cardiology- continue collaboration  Continue current regimen- refills provided today  Follow up in 6 months or sooner if concerns arise        Relevant Medications   metoprolol tartrate (LOPRESSOR) 25 MG tablet     Respiratory   COPD mixed type (HCC)    Chronic, historic condition  Reports breathing is usually well controlled but has coughing at this time of the year due to dust and pollen  She is taking Spiriva and Symbicort as directed- refills provided today She is taking Singulair and is requesting codeine cough syrup to assist with coughing as Tessalon pearls are not providing adequate relief PDMP reviewed, no evidence of worrisome controlled substance use patterns at this time  Will provide script for cough syrup per request Refills provided today for inhalers and Tessalon as well Continue current regimen Follow up in 3 months or sooner if concerns arise        Relevant Medications   guaiFENesin-Codeine 100-6.33 MG/5ML SOLN   montelukast (SINGULAIR) 10 MG tablet   benzonatate (TESSALON) 200 MG capsule   budesonide-formoterol (SYMBICORT) 160-4.5 MCG/ACT inhaler   Tiotropium Bromide Monohydrate (SPIRIVA RESPIMAT) 2.5 MCG/ACT AERS   VENTOLIN HFA 108 (90 Base) MCG/ACT inhaler   Other Relevant Orders   Ambulatory Referral Lung Cancer Screening [facility]     Digestive   Gastroesophageal reflux disease    Chronic,  historic condition Appears well managed with Protonix Continue medication and avoid trigger foods Follow up in 3 months for monitoring and medication management         Other   Depression    Chronic, historic condition,  Currently taking Duloxetine 20 mg PO QD and Quetiapine 25 mg PO 2 up to 2 tablets in AM and up to 3 tablets at bedtime She reports anxiety has decreased but she is still having sleep issues - particularly sleep maintenance May need to discuss further at follow up - may need to increase Duloxetine to assist further with anxious thoughts at night Continue current medications for now - refills provided  Follow up in 3 months for monitoring and medication management       Relevant Medications   QUEtiapine (SEROQUEL) 25 MG tablet   DULoxetine (CYMBALTA) 20 MG capsule   Cigarette smoker    Chronic, ongoing Will place referral for lung cancer screening  Recommend she continues to decrease cigarette use to discontinuation Follow up as needed for persistent or progressing symptoms        Relevant Orders   Ambulatory Referral Lung Cancer Screening [facility]   Other chronic pain    Chronic, historic condition  Appears well controlled with flexeril 10 mg PO TID PRN, Gabapentin 300 mg PO QID, and Duloxetine 20 mg PO QD Continue current medications- refills provided today  Follow up in 3 months for monitoring and routine management       Relevant Medications   DULoxetine (CYMBALTA) 20 MG capsule   cyclobenzaprine (FLEXERIL) 10 MG tablet   Tachycardia  Relevant Medications   metoprolol tartrate (LOPRESSOR) 25 MG tablet   Cough in adult   Relevant Medications   montelukast (SINGULAIR) 10 MG tablet   benzonatate (TESSALON) 200 MG capsule   Mixed hyperlipidemia    Chronic.  Controlled.  Continue with current medication regimen of Crestor 5mg  daily. Refills sent today.  Labs ordered today.  Return to clinic in 6 months for reevaluation.  Call sooner if concerns  arise.        Relevant Medications   metoprolol tartrate (LOPRESSOR) 25 MG tablet   Other Relevant Orders   Lipid Profile   Advanced care planning/counseling discussion    A voluntary discussion about advance care planning including the explanation and discussion of advance directives was extensively discussed  with the patient for 5 minutes with patient and myself present.  Explanation about the health care proxy and Living will was reviewed and packet with forms with explanation of how to fill them out was given.  During this discussion, the patient was not able to identify a health care proxy  and plans to fill out the paperwork required.  Patient was offered a separate Advance Care Planning visit for further assistance with forms.         Other Visit Diagnoses     Annual physical exam    -  Primary  -USPSTF grade A and B recommendations reviewed with patient; age-appropriate recommendations, preventive care, screening tests, etc discussed and encouraged; healthy living encouraged; see AVS for patient education given to patient -Discussed importance of 150 minutes of physical activity weekly, eat two servings of fish weekly, eat one serving of tree nuts ( cashews, pistachios, pecans, almonds.Marland Kitchen) every other day, eat 6 servings of fruit/vegetables daily and drink plenty of water and avoid sweet beverages.   -Reviewed Health Maintenance: Yes.      Relevant Orders   Comp Met (CMET)   CBC w/Diff   Lipid Profile   TSH   HgB A1c   Cologuard   Colon cancer screening       Relevant Orders   Cologuard   Breast cancer screening by mammogram            Return in about 3 months (around 01/27/2023) for HLD, Depression, COPD, mood .   I, Levaughn Puccinelli E Porche Steinberger, PA-C, have reviewed all documentation for this visit. The documentation on 10/27/22 for the exam, diagnosis, procedures, and orders are all accurate and complete.   Jacquelin Hawking, MHS, PA-C [facility]

## 2022-10-28 LAB — CBC WITH DIFFERENTIAL/PLATELET
Basophils Absolute: 0 10*3/uL (ref 0.0–0.2)
Basos: 0 %
EOS (ABSOLUTE): 0.1 10*3/uL (ref 0.0–0.4)
Eos: 2 %
Hematocrit: 41.2 % (ref 34.0–46.6)
Hemoglobin: 13.6 g/dL (ref 11.1–15.9)
Immature Grans (Abs): 0 10*3/uL (ref 0.0–0.1)
Immature Granulocytes: 0 %
Lymphocytes Absolute: 2.2 10*3/uL (ref 0.7–3.1)
Lymphs: 31 %
MCH: 33.7 pg — ABNORMAL HIGH (ref 26.6–33.0)
MCHC: 33 g/dL (ref 31.5–35.7)
MCV: 102 fL — ABNORMAL HIGH (ref 79–97)
Monocytes Absolute: 0.5 10*3/uL (ref 0.1–0.9)
Monocytes: 7 %
Neutrophils Absolute: 4.3 10*3/uL (ref 1.4–7.0)
Neutrophils: 60 %
Platelets: 150 10*3/uL (ref 150–450)
RBC: 4.03 x10E6/uL (ref 3.77–5.28)
RDW: 12.1 % (ref 11.7–15.4)
WBC: 7.1 10*3/uL (ref 3.4–10.8)

## 2022-10-28 LAB — LIPID PANEL
Chol/HDL Ratio: 4.7 ratio — ABNORMAL HIGH (ref 0.0–4.4)
Cholesterol, Total: 126 mg/dL (ref 100–199)
HDL: 27 mg/dL — ABNORMAL LOW (ref >39)
LDL Chol Calc (NIH): 66 mg/dL (ref 0–99)
Triglycerides: 200 mg/dL — ABNORMAL HIGH (ref 0–149)
VLDL Cholesterol Cal: 33 mg/dL (ref 5–40)

## 2022-10-28 LAB — COMPREHENSIVE METABOLIC PANEL
ALT: 13 IU/L (ref 0–32)
AST: 15 IU/L (ref 0–40)
Albumin: 4.6 g/dL (ref 3.8–4.9)
Alkaline Phosphatase: 90 IU/L (ref 44–121)
BUN/Creatinine Ratio: 14 (ref 12–28)
BUN: 13 mg/dL (ref 8–27)
Bilirubin Total: <0.2 mg/dL (ref 0.0–1.2)
CO2: 22 mmol/L (ref 20–29)
Calcium: 9.3 mg/dL (ref 8.7–10.3)
Chloride: 102 mmol/L (ref 96–106)
Creatinine, Ser: 0.91 mg/dL (ref 0.57–1.00)
Globulin, Total: 2.4 g/dL (ref 1.5–4.5)
Glucose: 109 mg/dL — ABNORMAL HIGH (ref 70–99)
Potassium: 4.5 mmol/L (ref 3.5–5.2)
Sodium: 142 mmol/L (ref 134–144)
Total Protein: 7 g/dL (ref 6.0–8.5)
eGFR: 72 mL/min/{1.73_m2} (ref >59)

## 2022-10-28 LAB — TSH: TSH: 0.99 u[IU]/mL (ref 0.450–4.500)

## 2022-10-28 LAB — HEMOGLOBIN A1C
Est. average glucose Bld gHb Est-mCnc: 117 mg/dL
Hgb A1c MFr Bld: 5.7 % — ABNORMAL HIGH (ref 4.8–5.6)

## 2022-10-30 NOTE — Progress Notes (Signed)
Your electrolytes, liver and kidney function were overall normal at this time Your CBC was normal/ stable at this time Your Cholesterol has improved since we checked in last - great job. Please continue to take your medications.  Your A1c has increased to 5.7 which is in prediabetic range. Medications are not required at this time but I do recommend that you try to reduce sugar and excess carbs to help manage this.  Your thyroid testing was normal

## 2022-11-01 DIAGNOSIS — Z419 Encounter for procedure for purposes other than remedying health state, unspecified: Secondary | ICD-10-CM | POA: Diagnosis not present

## 2022-11-04 LAB — COLOGUARD

## 2022-11-05 NOTE — Progress Notes (Signed)
Cologuard sample was not able to be processed- empty collection kit was returned. Manufacturer will send new kit for another attempt at completion

## 2022-12-01 DIAGNOSIS — Z419 Encounter for procedure for purposes other than remedying health state, unspecified: Secondary | ICD-10-CM | POA: Diagnosis not present

## 2022-12-02 DIAGNOSIS — H5213 Myopia, bilateral: Secondary | ICD-10-CM | POA: Diagnosis not present

## 2022-12-28 ENCOUNTER — Other Ambulatory Visit: Payer: Self-pay | Admitting: Physician Assistant

## 2022-12-28 ENCOUNTER — Other Ambulatory Visit: Payer: Self-pay | Admitting: Nurse Practitioner

## 2022-12-28 DIAGNOSIS — J449 Chronic obstructive pulmonary disease, unspecified: Secondary | ICD-10-CM

## 2022-12-28 DIAGNOSIS — R059 Cough, unspecified: Secondary | ICD-10-CM

## 2022-12-29 NOTE — Telephone Encounter (Signed)
Requested Prescriptions  Pending Prescriptions Disp Refills   rosuvastatin (CRESTOR) 10 MG tablet [Pharmacy Med Name: rosuvastatin 10 mg tablet] 90 tablet 3    Sig: TAKE ONE TABLET BY MOUTH DAILY     Cardiovascular:  Antilipid - Statins 2 Failed - 12/28/2022  2:58 PM      Failed - Lipid Panel in normal range within the last 12 months    Cholesterol, Total  Date Value Ref Range Status  10/27/2022 126 100 - 199 mg/dL Final   LDL Chol Calc (NIH)  Date Value Ref Range Status  10/27/2022 66 0 - 99 mg/dL Final   HDL  Date Value Ref Range Status  10/27/2022 27 (L) >39 mg/dL Final   Triglycerides  Date Value Ref Range Status  10/27/2022 200 (H) 0 - 149 mg/dL Final         Passed - Cr in normal range and within 360 days    Creatinine, Ser  Date Value Ref Range Status  10/27/2022 0.91 0.57 - 1.00 mg/dL Final         Passed - Patient is not pregnant      Passed - Valid encounter within last 12 months    Recent Outpatient Visits           2 months ago Annual physical exam   [place] [facility] Mecum, Erin E, PA-C   9 months ago COPD mixed type [facility])   [place] [facility] Larae Grooms, NP   1 year ago Cough in adult   [place] [facility] Mecum, Oswaldo Conroy, PA-C   1 year ago COPD mixed type [facility])   [place] [facility] Mecum, Erin E, PA-C   1 year ago Severe episode of recurrent major depressive disorder, without psychotic features [facility])   [place] [facility] Larae Grooms, NP       Future Appointments             In 4 weeks Larae Grooms, NP [place] [facility]

## 2022-12-29 NOTE — Telephone Encounter (Signed)
Requested Prescriptions  Pending Prescriptions Disp Refills   VENTOLIN HFA 108 (90 Base) MCG/ACT inhaler [Pharmacy Med Name: Ventolin HFA 90 mcg/actuation aerosol inhaler] 18 g 0    Sig: inhale TWO puffs into THE lungs EVERY 6 HOURS AS NEEDED FOR wheezing OR SHORTNESS OF BREATH.     Pulmonology:  Beta Agonists 2 Passed - 12/28/2022  2:53 PM      Passed - Last BP in normal range    BP Readings from Last 1 Encounters:  10/27/22 131/82         Passed - Last Heart Rate in normal range    Pulse Readings from Last 1 Encounters:  10/27/22 97         Passed - Valid encounter within last 12 months    Recent Outpatient Visits           2 months ago Annual physical exam   [place] [facility] Mecum, Erin E, PA-C   9 months ago COPD mixed type [facility])   [place] [facility] Larae Grooms, NP   1 year ago Cough in adult   [place] [facility] Mecum, Oswaldo Conroy, PA-C   1 year ago COPD mixed type [facility])   [place] Mecum, Erin E, PA-C   1 year ago Severe episode of recurrent major depressive disorder, without psychotic features (HCC)   [place] [facility] Larae Grooms, NP       Future Appointments             In 4 weeks Larae Grooms, NP [place] [facility]             Tiotropium Bromide Monohydrate (SPIRIVA RESPIMAT) 2.5 MCG/ACT AERS [Pharmacy Med Name: Spiriva Respimat 2.5 mcg/actuation solution for inhalation] 4 g 0    Sig: inhale TWO puffs into THE lungs DAILY.     Pulmonology:  Anticholinergic Agents Passed - 12/28/2022  2:53 PM      Passed - Valid encounter within last 12 months    Recent Outpatient Visits           2 months ago Annual physical exam   [place] [facility] Mecum, Erin E, PA-C   9 months ago COPD mixed type [place] [facility])   [place] [facility] Larae Grooms, NP   1 year ago Cough in adult   [place]  [facility] Mecum, Oswaldo Conroy, PA-C   1 year ago COPD mixed type [facility])   [place] [facility] Mecum, Oswaldo Conroy, PA-C   1 year ago Severe episode of recurrent major depressive disorder, without psychotic features [facility])   [place] [facility] Larae Grooms, NP       Future Appointments             In 4 weeks Larae Grooms, NP [place] [facility]             benzonatate (TESSALON) 200 MG capsule [Pharmacy Med Name: benzonatate 200 mg capsule] 60 capsule 0    Sig: TAKE ONE CAPSULE BY MOUTH TWICE DAILY AS NEEDED FOR cough.     Ear, Nose, and Throat:  Antitussives/Expectorants Passed - 12/28/2022  2:53 PM      Passed - Valid encounter within last 12 months    Recent Outpatient Visits           2 months ago Annual physical exam   [place] [facility]ecum, Erin E, PA-C   9 months ago COPD mixed type [facility])   [place] [facility], NP   1 year ago Cough in adult   [place] [facility] Mecum, Oswaldo Conroy, PA-C   1 year ago COPD mixed type [facility])   [place] [facility] Mecum, Oswaldo Conroy, PA-C   1 year ago Severe episode of recurrent major depressive disorder, without psychotic features [facility])   [place] [facility] Larae Grooms, NP       Future Appointments             In 4 weeks Larae Grooms, NP [place] [facility], [facility]

## 2023-01-01 DIAGNOSIS — Z419 Encounter for procedure for purposes other than remedying health state, unspecified: Secondary | ICD-10-CM | POA: Diagnosis not present

## 2023-01-22 ENCOUNTER — Other Ambulatory Visit: Payer: Self-pay | Admitting: Nurse Practitioner

## 2023-01-22 DIAGNOSIS — G8929 Other chronic pain: Secondary | ICD-10-CM

## 2023-01-25 NOTE — Telephone Encounter (Signed)
Requested Prescriptions  Pending Prescriptions Disp Refills   gabapentin (NEURONTIN) 300 MG capsule [Pharmacy Med Name: gabapentin 300 mg capsule] 120 capsule 2    Sig: TAKE ONE CAPSULE BY MOUTH FOUR TIMES DAILY     Neurology: Anticonvulsants - gabapentin Passed - 01/22/2023  8:01 AM      Passed - Cr in normal range and within 360 days    Creatinine, Ser  Date Value Ref Range Status  10/27/2022 0.91 0.57 - 1.00 mg/dL Final         Passed - Completed PHQ-2 or PHQ-9 in the last 360 days      Passed - Valid encounter within last 12 months    Recent Outpatient Visits           3 months ago Annual physical exam   [place] [facility] Mecum, Erin E, PA-C   9 months ago COPD mixed type [facility])   [place] [facility] Larae Grooms, NP   1 year ago Cough in adult   [place] [facility] Mecum, Oswaldo Conroy, PA-C   1 year ago COPD mixed type [facility])   [place] [facility] Mecum, Erin E, PA-C   1 year ago Severe episode of recurrent major depressive disorder, without psychotic features (HCC)   [place] [facility] Larae Grooms, NP       Future Appointments             In 2 days Larae Grooms, NP [place] [facility], [facility]

## 2023-01-27 ENCOUNTER — Ambulatory Visit: Payer: Medicaid Other | Admitting: Nurse Practitioner

## 2023-01-27 ENCOUNTER — Other Ambulatory Visit: Payer: Self-pay | Admitting: Nurse Practitioner

## 2023-01-27 DIAGNOSIS — R059 Cough, unspecified: Secondary | ICD-10-CM

## 2023-01-27 NOTE — Telephone Encounter (Signed)
Requested Prescriptions  Pending Prescriptions Disp Refills   benzonatate (TESSALON) 200 MG capsule [Pharmacy Med Name: benzonatate 200 mg capsule] 60 capsule 0    Sig: TAKE ONE CAPSULE BY MOUTH TWICE DAILY AS NEEDED FOR cough.     Ear, Nose, and Throat:  Antitussives/Expectorants Passed - 01/27/2023  3:33 PM      Passed - Valid encounter within last 12 months    Recent Outpatient Visits           3 months ago Annual physical exam   [place] [facility] Mecum, Erin E, PA-C   10 months ago COPD mixed type [facility])   [place] [facility], NP   1 year ago Cough in adult   [place] [facility] Mecum, Oswaldo Conroy, PA-C   1 year ago COPD mixed type [facility])   [place] [facility] Mecum, Oswaldo Conroy, PA-C   1 year ago Severe episode of recurrent major depressive disorder, without psychotic features [facility])   [place] [facility] Larae Grooms, NP       Future Appointments             In 2 weeks Larae Grooms, NP  [facility], [facility]

## 2023-01-29 ENCOUNTER — Other Ambulatory Visit: Payer: Self-pay | Admitting: Physician Assistant

## 2023-01-29 ENCOUNTER — Other Ambulatory Visit: Payer: Self-pay | Admitting: Nurse Practitioner

## 2023-01-29 DIAGNOSIS — K219 Gastro-esophageal reflux disease without esophagitis: Secondary | ICD-10-CM

## 2023-01-29 DIAGNOSIS — J449 Chronic obstructive pulmonary disease, unspecified: Secondary | ICD-10-CM

## 2023-01-31 DIAGNOSIS — Z419 Encounter for procedure for purposes other than remedying health state, unspecified: Secondary | ICD-10-CM | POA: Diagnosis not present

## 2023-02-01 NOTE — Telephone Encounter (Signed)
Requested medication (s) are due for refill today - provider review   Requested medication (s) are on the active medication list -yes  Future visit scheduled -yes  Last refill: 10/27/22  Notes to clinic: off protocol- provider review   Requested Prescriptions  Pending Prescriptions Disp Refills   guaiFENesin-codeine 100-10 MG/5ML syrup [Pharmacy Med Name: codeine 10 mg-guaifenesin 100 mg/5 mL oral liquid] 200 mL 0    Sig: take BY MOUTH TWICE DAILY AS NEEDED.     Off-Protocol Failed - 01/29/2023  3:01 PM      Failed - Medication not assigned to a protocol, review manually.      Passed - Valid encounter within last 12 months    Recent Outpatient Visits           3 months ago Annual physical exam   [place] [facility] Mecum, Erin E, PA-C   10 months ago COPD mixed type [facility])   [place] [facility] Larae Grooms, NP   1 year ago Cough in adult   [place] [facility] Mecum, Oswaldo Conroy, PA-C   1 year ago COPD mixed type [facility])   [place] [facility] Mecum, Oswaldo Conroy, PA-C   1 year ago Severe episode of recurrent major depressive disorder, without psychotic features [facility])   [place] [facility] Larae Grooms, NP       Future Appointments             In 1 week Larae Grooms, NP [place] [facility]               Requested Prescriptions  Pending Prescriptions Disp Refills   guaiFENesin-codeine 100-10 MG/5ML syrup [Pharmacy Med Name: codeine 10 mg-guaifenesin 100 mg/5 mL oral liquid] 200 mL 0    Sig: take BY MOUTH TWICE DAILY AS NEEDED.     Off-Protocol Failed - 01/29/2023  3:01 PM      Failed - Medication not assigned to a protocol, review manually.      Passed - Valid encounter within last 12 months    Recent Outpatient Visits           3 months ago Annual physical exam   [place] [facility] Mecum, Erin E, PA-C   10 months  ago COPD mixed type [facility])   [place] [facility] Grooms, NP   1 year ago Cough in adult   [place] [facility] Mecum, Oswaldo Conroy, PA-C   1 year ago COPD mixed type [facility])   [place] [facility] Mecum, Oswaldo Conroy, PA-C   1 year ago Severe episode of recurrent major depressive disorder, without psychotic features [facility])   [place] [facility] Larae Grooms, NP       Future Appointments             In 1 week Larae Grooms, NP [place] [facility], [facility]

## 2023-02-02 NOTE — Telephone Encounter (Signed)
Requested Prescriptions  Pending Prescriptions Disp Refills   pantoprazole (PROTONIX) 20 MG tablet [Pharmacy Med Name: pantoprazole 20 mg tablet,delayed release] 180 tablet 0    Sig: TAKE ONE TABLET BY MOUTH TWICE DAILY.     Gastroenterology: Proton Pump Inhibitors Passed - 01/29/2023  3:01 PM      Passed - Valid encounter within last 12 months    Recent Outpatient Visits           3 months ago Annual physical exam   [place] [facility] M[facility]um, Erin E, PA-C   10 months ago COPD mixed type [facility])   [place] [facility] Larae Grooms, NP   1 year ago Cough in adult   [place] [facility] Mecum, Oswaldo Conroy, PA-C   1 year ago COPD mixed type [facility])   [place] [facility] Mecum, Oswaldo Conroy, PA-C   1 year ago Severe episode of recurrent major depressive disorder, without psychotic features [facility])   [place] [facility] Larae Grooms, NP       Future Appointments             In 1 week Larae Grooms, NP [place] [facility], [facility]

## 2023-02-08 ENCOUNTER — Other Ambulatory Visit: Payer: Self-pay | Admitting: Nurse Practitioner

## 2023-02-08 DIAGNOSIS — J449 Chronic obstructive pulmonary disease, unspecified: Secondary | ICD-10-CM

## 2023-02-09 NOTE — Telephone Encounter (Signed)
Requested Prescriptions  Pending Prescriptions Disp Refills   Tiotropium Bromide Monohydrate (SPIRIVA RESPIMAT) 2.5 MCG/ACT AERS [Pharmacy Med Name: Spiriva Respimat 2.5 mcg/actuation solution for inhalation] 4 g 2    Sig: inhale TWO puffs into THE lungs DAILY.     Pulmonology:  Anticholinergic Agents Passed - 02/08/2023 10:33 AM      Passed - Valid encounter within last 12 months    Recent Outpatient Visits           3 months ago Annual physical exam   [place] [facility] M[facility]um, Erin E, PA-C   10 months ago COPD mixed type [facility])   [place] [facility] Larae Grooms, NP   1 year ago Cough in adult   [place] [facility] Mecum, Oswaldo Conroy, PA-C   1 year ago COPD mixed type [facility])   [place] [facility] Mecum, Erin E, PA-C   1 year ago Severe episode of recurrent major depressive disorder, without psychotic features (HCC)   [place] [facility] Larae Grooms, NP       Future Appointments             In 2 days Larae Grooms, NP [place] [facility]             VENTOLIN HFA 108 [phone] Base) MCG/ACT inhaler [Pharmacy Med Name: Ventolin HFA 90 mcg/actuation aerosol inhaler] 18 g 2    Sig: inhale TWO puffs into THE lungs EVERY 6 HOURS AS NEEDED FOR wheezing OR SHORTNESS OF BREATH.     Pulmonology:  Beta Agonists 2 Passed - 02/08/2023 10:33 AM      Passed - Last BP in normal range    BP Readings from Last 1 Encounters:  10/27/22 131/82         Passed - Last Heart Rate in normal range    Pulse Readings from Last 1 Encounters:  10/27/22 97         Passed - Valid encounter within last 12 months    Recent Outpatient Visits           3 months ago Annual physical exam   [place] [facility] Mecum, Erin E, PA-C   10 months ago COPD mixed type [facility])   [place] [facility], NP   1 year ago Cough in adult   [place]  [facility] Mecum, Oswaldo Conroy, PA-C   1 year ago COPD mixed type [facility])   [place] Mecum, Erin E, PA-C   1 year ago Severe episode of recurrent major depressive disorder, without psychotic features [facility])   [place] [facility] Lar[facility] Grooms, NP       Future Appointments             In 2 days Larae Grooms, NP [place] [facility]

## 2023-02-11 ENCOUNTER — Ambulatory Visit: Payer: Medicaid Other | Admitting: Nurse Practitioner

## 2023-03-03 DIAGNOSIS — Z419 Encounter for procedure for purposes other than remedying health state, unspecified: Secondary | ICD-10-CM | POA: Diagnosis not present

## 2023-03-15 ENCOUNTER — Encounter: Payer: Self-pay | Admitting: Nurse Practitioner

## 2023-03-15 ENCOUNTER — Ambulatory Visit (INDEPENDENT_AMBULATORY_CARE_PROVIDER_SITE_OTHER): Payer: Medicaid Other | Admitting: Nurse Practitioner

## 2023-03-15 VITALS — BP 140/82 | HR 108 | Temp 97.5°F | Ht 65.0 in | Wt 144.6 lb

## 2023-03-15 DIAGNOSIS — R000 Tachycardia, unspecified: Secondary | ICD-10-CM

## 2023-03-15 DIAGNOSIS — E782 Mixed hyperlipidemia: Secondary | ICD-10-CM | POA: Diagnosis not present

## 2023-03-15 DIAGNOSIS — R7303 Prediabetes: Secondary | ICD-10-CM

## 2023-03-15 DIAGNOSIS — F332 Major depressive disorder, recurrent severe without psychotic features: Secondary | ICD-10-CM

## 2023-03-15 DIAGNOSIS — G8929 Other chronic pain: Secondary | ICD-10-CM

## 2023-03-15 DIAGNOSIS — R059 Cough, unspecified: Secondary | ICD-10-CM | POA: Diagnosis not present

## 2023-03-15 DIAGNOSIS — J449 Chronic obstructive pulmonary disease, unspecified: Secondary | ICD-10-CM | POA: Diagnosis not present

## 2023-03-15 DIAGNOSIS — Z1231 Encounter for screening mammogram for malignant neoplasm of breast: Secondary | ICD-10-CM

## 2023-03-15 DIAGNOSIS — Z23 Encounter for immunization: Secondary | ICD-10-CM | POA: Diagnosis not present

## 2023-03-15 DIAGNOSIS — K219 Gastro-esophageal reflux disease without esophagitis: Secondary | ICD-10-CM

## 2023-03-15 DIAGNOSIS — I341 Nonrheumatic mitral (valve) prolapse: Secondary | ICD-10-CM | POA: Diagnosis not present

## 2023-03-15 MED ORDER — METOPROLOL TARTRATE 25 MG PO TABS: 25.0000 mg | ORAL_TABLET | Freq: Two times a day (BID) | ORAL | 1 refills | Status: DC

## 2023-03-15 MED ORDER — QUETIAPINE FUMARATE 25 MG PO TABS: 50.0000 mg | ORAL_TABLET | Freq: Two times a day (BID) | ORAL | 2 refills | Status: AC

## 2023-03-15 MED ORDER — GABAPENTIN 300 MG PO CAPS: 300.0000 mg | ORAL_CAPSULE | Freq: Four times a day (QID) | ORAL | 2 refills | Status: AC

## 2023-03-15 MED ORDER — PANTOPRAZOLE SODIUM 20 MG PO TBEC: 20.0000 mg | DELAYED_RELEASE_TABLET | Freq: Two times a day (BID) | ORAL | 1 refills | Status: DC

## 2023-03-15 MED ORDER — ROSUVASTATIN CALCIUM 10 MG PO TABS: 10.0000 mg | ORAL_TABLET | Freq: Every day | ORAL | 1 refills | Status: DC

## 2023-03-15 MED ORDER — MONTELUKAST SODIUM 10 MG PO TABS: 10.0000 mg | ORAL_TABLET | Freq: Every day | ORAL | 1 refills | Status: DC

## 2023-03-15 MED ORDER — DULOXETINE HCL 20 MG PO CPEP: 20.0000 mg | ORAL_CAPSULE | Freq: Every day | ORAL | 1 refills | Status: DC

## 2023-03-15 MED ORDER — BUDESONIDE-FORMOTEROL FUMARATE 160-4.5 MCG/ACT IN AERO: 2.0000 | INHALATION_SPRAY | Freq: Two times a day (BID) | RESPIRATORY_TRACT | 3 refills | Status: AC

## 2023-03-15 MED ORDER — GUAIFENESIN-CODEINE 100-6.33 MG/5ML PO SOLN: 5.0000 mL | Freq: Every evening | ORAL | 0 refills | Status: AC | PRN

## 2023-03-15 NOTE — Assessment & Plan Note (Signed)
 Referral placed for Cardiology.  Has seen Cardiology in 2023.  Needs updated appt.

## 2023-03-15 NOTE — Assessment & Plan Note (Signed)
 Chronic, controlled. Appears well controlled with flexeril 10 mg PO TID PRN, Gabapentin 300 mg PO QID, and Duloxetine 20 mg PO QD Continue current medications- refills provided today  Follow up in 3 months for monitoring and routine management

## 2023-03-15 NOTE — Assessment & Plan Note (Signed)
 Chronic.  Controlled.  Continue with current medication regimen of Crestor 10mg  daily.  Refills sent today.  Labs ordered today.  Return to clinic in 6 months for reevaluation.  Call sooner if concerns arise.  ? ?

## 2023-03-15 NOTE — Assessment & Plan Note (Signed)
 Chronic, Ongoing.  Reports breathing is usually well controlled but has coughing at this time of the year due to dust and pollen  She is taking Spiriva  and Symbicort  as directed- refills provided today She is taking Singulair  and is requesting codeine  cough syrup to assist with coughing as Tessalon  pearls are not providing adequate relief. PDMP reviewed, no evidence of worrisome controlled substance use patterns at this time  Will provide script for cough syrup per request- however, will not refill in the future unless patient see's pulmonology. Refills provided today for inhalers and Tessalon  as well Continue current regimen Follow up in 6 months or sooner if concerns arise

## 2023-03-15 NOTE — Assessment & Plan Note (Signed)
 Chronic, historic condition,  Currently taking Duloxetine  20 mg PO QD and Quetiapine  25 mg PO 2 up to 2 tablets in AM and up to 3 tablets at bedtime Continue current medications for now - refills provided  Follow up in 6 months for monitoring and medication management

## 2023-03-15 NOTE — Progress Notes (Signed)
 BP (!) 140/82 (BP Location: Left Wrist, Patient Position: Sitting, Cuff Size: Small)   Pulse (!) 108   Temp (!) 97.5 F (36.4 C) (Oral)   Ht 5' 5 (1.651 m)   Wt 144 lb 9.6 oz (65.6 kg)   SpO2 98%   BMI 24.06 kg/m    Subjective:    Patient ID: Karen Foster Shallow, female    DOB: 02/08/1961, 63 y.o.   MRN: [id number]  HPI: BECCA BAYNE is a 63 y.o. female  Chief Complaint  Patient presents with   3 month follow up   Medication Refill    All medications   COPD   Depression    COPD Patient states she has been having an ongoing cough. She has cut down her smoking to less than a half a pack a day.  Using the spirivia and symbicort .  Patient states she has her cough.   COPD status: Ongoing Satisfied with current treatment?: no Oxygen use: no Dyspnea frequency: everyday Cough frequency: yes Rescue inhaler frequency:  not everyday but when she does use it no more than twice daily.   Limitation of activity: yes Productive cough: no Last Spirometry:  Pneumovax: Up to Date Influenza: Up to Date  HYPERLIPIDEMIA Hyperlipidemia status: excellent compliance Satisfied with current treatment?  no Side effects:  yes Medication compliance: excellent compliance Past cholesterol meds: rosuvastatin  (crestor ) Supplements: none Aspirin:  no The ASCVD Risk score (Arnett DK, et al., 2019) failed to calculate for the following reasons:   The valid total cholesterol range is 130 to 320 mg/dL Chest pain:  no Coronary artery disease:  no Family history CAD:  no Family history early CAD:  no    Relevant past medical, surgical, family and social history reviewed and updated as indicated. Interim medical history since our last visit reviewed. Allergies and medications reviewed and updated.  Review of Systems  Eyes:  Negative for visual disturbance.  Respiratory:  Positive for cough. Negative for chest tightness and shortness of breath.   Cardiovascular:  Negative for chest pain,  palpitations and leg swelling.  Neurological:  Negative for dizziness and headaches.    Per HPI unless specifically indicated above     Objective:    BP (!) 140/82 (BP Location: Left Wrist, Patient Position: Sitting, Cuff Size: Small)   Pulse (!) 108   Temp (!) 97.5 F (36.4 C) (Oral)   Ht 5' 5 (1.651 m)   Wt 144 lb 9.6 oz (65.6 kg)   SpO2 98%   BMI 24.06 kg/m   Wt Readings from Last 3 Encounters:  03/15/23 144 lb 9.6 oz (65.6 kg)  10/27/22 151 lb (68.5 kg)  04/02/22 143 lb 11.2 oz (65.2 kg)    Physical Exam Vitals and nursing note reviewed.  Constitutional:      General: She is not in acute distress.    Appearance: Normal appearance. She is normal weight. She is not ill-appearing, toxic-appearing or diaphoretic.  HENT:     Head: Normocephalic.     Right Ear: External ear normal.     Left Ear: External ear normal.     Nose: Nose normal.     Mouth/Throat:     Mouth: Mucous membranes are moist.     Pharynx: Oropharynx is clear.  Eyes:     General:        Right eye: No discharge.        Left eye: No discharge.     Extraocular Movements: Extraocular movements intact.  Conjunctiva/sclera: Conjunctivae normal.     Pupils: Pupils are equal, round, and reactive to light.  Cardiovascular:     Rate and Rhythm: Normal rate and regular rhythm.     Heart sounds: No murmur heard. Pulmonary:     Effort: Pulmonary effort is normal. No respiratory distress.     Breath sounds: No wheezing or rales.  Musculoskeletal:     Cervical back: Normal range of motion and neck supple.  Skin:    General: Skin is warm and dry.     Capillary Refill: Capillary refill takes less than 2 seconds.  Neurological:     General: No focal deficit present.     Mental Status: She is alert and oriented to person, place, and time. Mental status is at baseline.  Psychiatric:        Mood and Affect: Mood normal.        Behavior: Behavior normal.        Thought Content: Thought content normal.         Judgment: Judgment normal.     Results for orders placed or performed in visit on 10/27/22  Comp Met (CMET)   Collection Time: 10/27/22  1:49 PM  Result Value Ref Range   Glucose 109 (H) 70 - 99 mg/dL   BUN 13 8 - 27 mg/dL   Creatinine, Ser 9.08 0.57 - 1.00 mg/dL   eGFR 72 >40 fO/fpw/8.26   BUN/Creatinine Ratio 14 12 - 28   Sodium 142 134 - 144 mmol/L   Potassium 4.5 3.5 - 5.2 mmol/L   Chloride 102 96 - 106 mmol/L   CO2 22 20 - 29 mmol/L   Calcium  9.3 8.7 - 10.3 mg/dL   Total Protein 7.0 6.0 - 8.5 g/dL   Albumin 4.6 3.8 - 4.9 g/dL   Globulin, Total 2.4 1.5 - 4.5 g/dL   Bilirubin Total <9.7 0.0 - 1.2 mg/dL   Alkaline Phosphatase 90 44 - 121 IU/L   AST 15 0 - 40 IU/L   ALT 13 0 - 32 IU/L  CBC w/Diff   Collection Time: 10/27/22  1:49 PM  Result Value Ref Range   WBC 7.1 3.4 - 10.8 x10E3/uL   RBC 4.03 3.77 - 5.28 x10E6/uL   Hemoglobin 13.6 11.1 - 15.9 g/dL   Hematocrit 58.7 65.9 - 46.6 %   MCV 102 (H) 79 - 97 fL   MCH 33.7 (H) 26.6 - 33.0 pg   MCHC 33.0 31.5 - 35.7 g/dL   RDW 87.8 88.2 - 84.5 %   Platelets 150 150 - 450 x10E3/uL   Neutrophils 60 Not Estab. %   Lymphs 31 Not Estab. %   Monocytes 7 Not Estab. %   Eos 2 Not Estab. %   Basos 0 Not Estab. %   Neutrophils Absolute 4.3 1.4 - 7.0 x10E3/uL   Lymphocytes Absolute 2.2 0.7 - 3.1 x10E3/uL   Monocytes Absolute 0.5 0.1 - 0.9 x10E3/uL   EOS (ABSOLUTE) 0.1 0.0 - 0.4 x10E3/uL   Basophils Absolute 0.0 0.0 - 0.2 x10E3/uL   Immature Granulocytes 0 Not Estab. %   Immature Grans (Abs) 0.0 0.0 - 0.1 x10E3/uL  Lipid Profile   Collection Time: 10/27/22  1:49 PM  Result Value Ref Range   Cholesterol, Total 126 100 - 199 mg/dL   Triglycerides 799 (H) 0 - 149 mg/dL   HDL 27 (L) >60 mg/dL   VLDL Cholesterol Cal 33 5 - 40 mg/dL   LDL Chol Calc (NIH) 66 0 - 99  mg/dL   Chol/HDL Ratio 4.7 (H) 0.0 - 4.4 ratio  TSH   Collection Time: 10/27/22  1:49 PM  Result Value Ref Range   TSH 0.990 0.450 - 4.500 uIU/mL  HgB A1c    Collection Time: 10/27/22  1:49 PM  Result Value Ref Range   Hgb A1c MFr Bld 5.7 (H) 4.8 - 5.6 %   Est. average glucose Bld gHb Est-mCnc 117 mg/dL  Cologuard   Collection Time: 11/04/22  8:05 PM  Result Value Ref Range   COLOGUARD Sample Could Not Be Processed 9 N/A      Assessment & Plan:   Problem List Items Addressed This Visit       Cardiovascular and Mediastinum   Mitral valve prolapse   Referral placed for Cardiology.  Has seen Cardiology in 2023.  Needs updated appt.      Relevant Medications   metoprolol  tartrate (LOPRESSOR ) 25 MG tablet   rosuvastatin  (CRESTOR ) 10 MG tablet   Other Relevant Orders   Ambulatory referral to Cardiology     Respiratory   COPD mixed type (HCC) - Primary   Chronic, Ongoing.  Reports breathing is usually well controlled but has coughing at this time of the year due to dust and pollen  She is taking Spiriva  and Symbicort  as directed- refills provided today She is taking Singulair  and is requesting codeine  cough syrup to assist with coughing as Tessalon  pearls are not providing adequate relief. PDMP reviewed, no evidence of worrisome controlled substance use patterns at this time  Will provide script for cough syrup per request- however, will not refill in the future unless patient see's pulmonology. Refills provided today for inhalers and Tessalon  as well Continue current regimen Follow up in 6 months or sooner if concerns arise       Relevant Medications   budesonide -formoterol  (SYMBICORT ) 160-4.5 MCG/ACT inhaler   montelukast  (SINGULAIR ) 10 MG tablet   guaiFENesin -Codeine  100-6.33 MG/5ML SOLN   Other Relevant Orders   Ambulatory referral to Pulmonology     Digestive   Gastroesophageal reflux disease   Relevant Medications   pantoprazole  (PROTONIX ) 20 MG tablet     Other   Depression   Chronic, historic condition,  Currently taking Duloxetine  20 mg PO QD and Quetiapine  25 mg PO 2 up to 2 tablets in AM and up to 3 tablets at  bedtime Continue current medications for now - refills provided  Follow up in 6 months for monitoring and medication management      Relevant Medications   DULoxetine  (CYMBALTA ) 20 MG capsule   QUEtiapine  (SEROQUEL ) 25 MG tablet   Other chronic pain   Chronic, controlled. Appears well controlled with flexeril  10 mg PO TID PRN, Gabapentin  300 mg PO QID, and Duloxetine  20 mg PO QD Continue current medications- refills provided today  Follow up in 3 months for monitoring and routine management       Relevant Medications   DULoxetine  (CYMBALTA ) 20 MG capsule   gabapentin  (NEURONTIN ) 300 MG capsule   Tachycardia   Relevant Medications   metoprolol  tartrate (LOPRESSOR ) 25 MG tablet   Cough in adult   Relevant Medications   montelukast  (SINGULAIR ) 10 MG tablet   Mixed hyperlipidemia   Chronic.  Controlled.  Continue with current medication regimen of Crestor  10 mg daily. Refills sent today.  Labs ordered today.  Return to clinic in 6 months for reevaluation.  Call sooner if concerns arise.       Relevant Medications   metoprolol  tartrate (LOPRESSOR )  25 MG tablet   rosuvastatin  (CRESTOR ) 10 MG tablet   Other Relevant Orders   Lipid Profile   Other Visit Diagnoses       Prediabetes       Relevant Orders   Hemoglobin A1c   Comprehensive metabolic panel     Encounter for screening mammogram for malignant neoplasm of breast       Relevant Orders   MM 3D SCREENING MAMMOGRAM BILATERAL BREAST     Need for influenza vaccination       Relevant Orders   Flu vaccine trivalent PF, 6mos and older(Flulaval,Afluria,Fluarix,Fluzone) (Completed)        Follow up plan: No follow-ups on file.

## 2023-03-16 ENCOUNTER — Telehealth: Payer: Self-pay

## 2023-03-16 ENCOUNTER — Other Ambulatory Visit: Payer: Self-pay | Admitting: Nurse Practitioner

## 2023-03-16 DIAGNOSIS — R059 Cough, unspecified: Secondary | ICD-10-CM

## 2023-03-16 LAB — COMPREHENSIVE METABOLIC PANEL
ALT: 13 [IU]/L (ref 0–32)
AST: 18 [IU]/L (ref 0–40)
Albumin: 4.6 g/dL (ref 3.9–4.9)
Alkaline Phosphatase: 87 [IU]/L (ref 44–121)
BUN/Creatinine Ratio: 23 (ref 12–28)
BUN: 23 mg/dL (ref 8–27)
Bilirubin Total: <0.2 mg/dL (ref 0.0–1.2)
CO2: 20 mmol/L (ref 20–29)
Calcium: 9.2 mg/dL (ref 8.7–10.3)
Chloride: 105 mmol/L (ref 96–106)
Creatinine, Ser: 1.02 mg/dL — ABNORMAL HIGH (ref 0.57–1.00)
Globulin, Total: 2.3 g/dL (ref 1.5–4.5)
Glucose: 105 mg/dL — ABNORMAL HIGH (ref 70–99)
Potassium: 5 mmol/L (ref 3.5–5.2)
Sodium: 141 mmol/L (ref 134–144)
Total Protein: 6.9 g/dL (ref 6.0–8.5)
eGFR: 63 mL/min/{1.73_m2} (ref >59)

## 2023-03-16 LAB — LIPID PANEL
Chol/HDL Ratio: 4.3 {ratio} (ref 0.0–4.4)
Cholesterol, Total: 124 mg/dL (ref 100–199)
HDL: 29 mg/dL — ABNORMAL LOW (ref >39)
LDL Chol Calc (NIH): 61 mg/dL (ref 0–99)
Triglycerides: 205 mg/dL — ABNORMAL HIGH (ref 0–149)
VLDL Cholesterol Cal: 34 mg/dL (ref 5–40)

## 2023-03-16 LAB — HEMOGLOBIN A1C
Est. average glucose Bld gHb Est-mCnc: 123 mg/dL
Hgb A1c MFr Bld: 5.9 % — ABNORMAL HIGH (ref 4.8–5.6)

## 2023-03-16 NOTE — Telephone Encounter (Signed)
 Pt seen lab results via Mychart and had concerns about A1C. Pt states she has horrible family hx with DM and wasn't sure if she needed to be on medication to help at this point or not. I reviewed message from Darice, NP. Pt states she doesn't eat a lot of sweets but does like Pepsi, I advised her to try diet Pepsi or Pepsi Maxx to decrease amount of carbs and small tweaks in diet to cut out carbs will help but would send message to Darice, NP notifying her of pts concerns and have nurse FU if they feel it appropriate.   Hi Karen Foster. It was nice to see you yesterday.  Your lab work looks good.  No concerns at this time. Your A1c is 5.9 which is increased slightly.  I recommend following a low carb diet and exercise.  Continue with your current medication regimen.  Follow up as discussed.  Please let me know if you have any questions.   Written by Karen Petty, NP on 03/16/2023  8:01 AM EST Seen by patient Karen Foster on 03/16/2023  4:48 PM

## 2023-03-16 NOTE — Telephone Encounter (Signed)
-----   Message from [facility] [name] sent at 03/16/2023  7:53 AM EST ----- Please assist  Thanks, Katie ----- Message ----- From: Melvin Pao, NP Sent: 03/15/2023   2:38 PM EST To: [facility]  Can we schedule her mammogram.  Schedule it for the afternoon.

## 2023-03-16 NOTE — Telephone Encounter (Signed)
 Patient is scheduled already 03/23/23 @2 :00

## 2023-03-16 NOTE — Telephone Encounter (Signed)
 PA for Spiriva initiated and submitted via Cover My Meds. Key: [id number]

## 2023-03-17 NOTE — Telephone Encounter (Signed)
 PA has been approved

## 2023-03-17 NOTE — Telephone Encounter (Signed)
 Requested Prescriptions  Pending Prescriptions Disp Refills   benzonatate  (TESSALON ) 200 MG capsule [Pharmacy Med Name: benzonatate  200 mg capsule] 60 capsule 1    Sig: TAKE ONE CAPSULE BY MOUTH TWICE DAILY AS NEEDED FOR cough.     Ear, Nose, and Throat:  Antitussives/Expectorants Passed - 03/17/2023  2:18 PM      Passed - Valid encounter within last 12 months    Recent Outpatient Visits           2 days ago COPD mixed type [facility])   [place] [facility] Aileen Alexanders, NP   4 months ago Annual physical exam   [place] [facility] Mecum, Pearla Bottom, PA-C   11 months ago COPD mixed type [facility])   [place] [facility] Aileen Alexanders, NP   1 year ago Cough in adult   [place] [facility] Mecum, Pearla Bottom, PA-C   1 year ago COPD mixed type [facility])   [place] [facility] Mecum, Erin E, PA-C       Future Appointments             In 1 week Dunn, Elvia Hammans, PA-C [facility]

## 2023-03-24 ENCOUNTER — Ambulatory Visit: Payer: Medicaid Other | Attending: Physician Assistant | Admitting: Physician Assistant

## 2023-03-24 NOTE — Progress Notes (Deleted)
Cardiology Office Note    Date:  03/24/2023   ID:  Karen Foster, DOB [birth year], MRN [id number]  PCP:  Larae Grooms, NP  Cardiologist:  Julien Nordmann, MD  Electrophysiologist:  None   Chief Complaint: Follow-up  History of Present Illness:   Karen Foster is a 63 y.o. female with history of mitral valve prolapse, COPD no use of greater than 40 years, sinus tachycardia, depression, and panic attacks who presents for follow-up of MVP.  She was seen as a new patient in 05/2021 at the request of her PCP for evaluation of MVP.  She reported that she had been diagnosed with this greater than 20 years prior.  She reported having previously been treated with metoprolol to tartrate 100 twice daily.  At her request, he was restarted on metoprolol tartrate 50 mg twice daily.  Echo in 06/2021 showed an EF of 55 to 60%, no regional wall motion abnormalities, grade 1 diastolic dysfunction, normal systolic function and ventricular cavity size, trivial mitral regurgitation with the mid to late systolic prolapse of the middle segment of the anterior leaflet of the mitral valve, and an estimated right atrial pressure of 3 mmHg.  ***   Labs independently reviewed: 03/2023 - BUN 23, serum creatinine 1.02, potassium 5.0, albumin 4.6, AST/ALT normal, A1c 5.9, TC 124, TG 205, HDL 29, LDL 61 10/2022 -TSH normal, Hgb 13.6, PLT 150  Past Medical History:  Diagnosis Date   Allergy    Anxiety    Arthritis    Depression    GERD (gastroesophageal reflux disease)    Glaucoma    Heart murmur    Kidney stones    Migraines    Panic attack    Panic attack    Urticaria 12/01/2018    Past Surgical History:  Procedure Laterality Date   FRACTURE SURGERY     JOINT REPLACEMENT  2011   Hip   REVISION TOTAL HIP ARTHROPLASTY  2011   SEPTOPLASTY  2007   TONSILLECTOMY     TOTAL ABDOMINAL HYSTERECTOMY W/ BILATERAL SALPINGOOPHORECTOMY      Current Medications: No outpatient medications have been marked as  taking for the 03/24/23 encounter (Appointment) with Sondra Barges, PA-C.    Allergies:   Bee venom, Ibuprofen, Sulfa antibiotics, Tomato, and Hydroxyzine   Social History   Socioeconomic History   Marital status: Widowed    Spouse name: Not on file   Number of children: Not on file   Years of education: Not on file   Highest education level: Bachelor's degree (e.g., BA, AB, BS)  Occupational History   Not on file  Tobacco Use   Smoking status: Every Day    Current packs/day: 0.30    Average packs/day: 0.3 packs/day for 40.0 years (12.0 ttl pk-yrs)    Types: Cigarettes   Smokeless tobacco: Never  Vaping Use   Vaping status: Never Used  Substance and Sexual Activity   Alcohol use: No   Drug use: No   Sexual activity: Not Currently  Other Topics Concern   Not on file  Social History Narrative   Not on file   Social Drivers of Health   Financial Resource Strain: Low Risk  (03/14/2023)   Overall Financial Resource Strain (CARDIA)    Difficulty of Paying Living Expenses: Not very hard  Food Insecurity: No Food Insecurity (03/14/2023)   Hunger Vital Sign    Worried About Running Out of Food in the Last Year: Never true    Ran  Out of Food in the Last Year: Never true  Transportation Needs: No Transportation Needs (03/14/2023)   PRAPARE - Administrator, Civil Service (Medical): No    Lack of Transportation (Non-Medical): No  Physical Activity: Insufficiently Active (03/14/2023)   Exercise Vital Sign    Days of Exercise per Week: 4 days    Minutes of Exercise per Session: 30 min  Stress: Stress Concern Present (03/14/2023)   [employer] of Occupational Health - Occupational Stress Questionnaire    Feeling of Stress : To some extent  Social Connections: Moderately Isolated (03/14/2023)   Social Connection and Isolation Panel [NHANES]    Frequency of Communication with Friends and Family: More than three times a week    Frequency of Social Gatherings with Friends  and Family: Twice a week    Attends Religious Services: More than 4 times per year    Active Member of [employer] or Organizations: No    Attends Banker Meetings: Not on file    Marital Status: Widowed     Family History:  The patient's family history includes Asthma in her maternal uncle and mother; Cancer in her father and maternal uncle; Diabetes in her maternal grandfather, mother, and paternal uncle; Heart attack in her maternal grandfather, mother, paternal aunt, and paternal uncle; Heart disease in her maternal aunt; Hyperlipidemia in her maternal grandfather; Hypertension in her maternal aunt; Kidney disease in her mother; Kidney failure in her maternal grandmother and mother; Multiple myeloma in her maternal grandmother; Other in her maternal grandmother. There is no history of Prostate cancer or Bladder Cancer.  ROS:   12-point review of systems is negative unless otherwise noted in the HPI.   EKGs/Labs/Other Studies Reviewed:    Studies reviewed were summarized above. The additional studies were reviewed today:  2D echo 06/30/2021: 1. Left ventricular ejection fraction, by estimation, is 55 to 60%. Left  ventricular ejection fraction by 2D MOD biplane is 58.3 %. The left  ventricle has normal function. The left ventricle has no regional wall  motion abnormalities. Left ventricular  diastolic parameters are consistent with Grade I diastolic dysfunction  (impaired relaxation). The average left ventricular global longitudinal  strain is -16.3 %. The global longitudinal strain is normal.   2. Right ventricular systolic function is normal. The right ventricular  size is normal.   3. The mitral valve is myxomatous. Trivial mitral valve regurgitation.  There is mild late systolic prolapse of the middle segment of the anterior  leaflet of the mitral valve.   4. The aortic valve is tricuspid. Aortic valve regurgitation is not  visualized.   5. The inferior vena cava is  normal in size with greater than 50%  respiratory variability, suggesting right atrial pressure of 3 mmHg.    EKG:  EKG is ordered today.  The EKG ordered today demonstrates ***  Recent Labs: 10/27/2022: Hemoglobin 13.6; Platelets 150; TSH 0.990 03/15/2023: ALT 13; BUN 23; Creatinine, Ser 1.02; Potassium 5.0; Sodium 141  Recent Lipid Panel    Component Value Date/Time   CHOL 124 03/15/2023 1456   TRIG 205 (H) 03/15/2023 1456   HDL 29 (L) 03/15/2023 1456   CHOLHDL 4.3 03/15/2023 1456   LDLCALC 61 03/15/2023 1456    PHYSICAL EXAM:    VS:  There were no vitals taken for this visit.  BMI: There is no height or weight on file to calculate BMI.  Physical Exam  Wt Readings from Last 3 Encounters:  03/15/23 144  lb 9.6 oz (65.6 kg)  10/27/22 151 lb (68.5 kg)  04/02/22 143 lb 11.2 oz (65.2 kg)     ASSESSMENT & PLAN:   Mitral valve prolapse: Sinus tachycardia: Tobacco use:   {Are you ordering a CV Procedure (e.g. stress test, cath, DCCV, TEE, etc)?   Press F2        :[id number]}     Disposition: F/u with Dr. Mariah Milling or an APP in ***.   Medication Adjustments/Labs and Tests Ordered: Current medicines are reviewed at length with the patient today.  Concerns regarding medicines are outlined above. Medication changes, Labs and Tests ordered today are summarized above and listed in the Patient Instructions accessible in Encounters.   Signed, Eula Listen, PA-C 03/24/2023 7:31 AM     [facility] [address] [place], [place] [address] [phone]

## 2023-04-02 ENCOUNTER — Encounter: Payer: Self-pay | Admitting: *Deleted

## 2023-04-03 DIAGNOSIS — Z419 Encounter for procedure for purposes other than remedying health state, unspecified: Secondary | ICD-10-CM | POA: Diagnosis not present

## 2023-04-21 ENCOUNTER — Telehealth: Payer: Self-pay

## 2023-04-21 DIAGNOSIS — F17210 Nicotine dependence, cigarettes, uncomplicated: Secondary | ICD-10-CM

## 2023-04-21 DIAGNOSIS — Z87891 Personal history of nicotine dependence: Secondary | ICD-10-CM

## 2023-04-21 DIAGNOSIS — Z122 Encounter for screening for malignant neoplasm of respiratory organs: Secondary | ICD-10-CM

## 2023-04-21 NOTE — Telephone Encounter (Signed)
.  Lung Cancer Screening Narrative/Criteria Questionnaire (Cigarette Smokers Only- No Cigars/Pipes/vapes)   Lawson Fiscal A Blount   SDMV:05/04/2023 at 11:30 with Dorene Grebe        Nov 23, 1960   LDCT: 05/05/2023 at 1;30pm at [facility]     63 y.o.   Phone: [phone]  Lung Screening Narrative (confirm age 69-77 yrs Medicare / 50-80 yrs Private pay insurance)   Insurance information:Medicaid   Referring Provider:Mecum   This screening involves an initial phone call with a team member from our program. It is called a shared decision making visit. The initial meeting is required by  insurance and Medicare to make sure you understand the program. This appointment takes about 15-20 minutes to complete. You will complete the screening scan at your scheduled date/time.  This scan takes about 5-10 minutes to complete. You can eat and drink normally before and after the scan.  Criteria questions for Lung Cancer Screening:   Are you a current or former smoker? Current Age began smoking: 15   If you are a former smoker, what year did you quit smoking? (within 15 yrs)   To calculate your smoking history, I need an accurate estimate of how many packs of cigarettes you smoked per day and for how many years. (Not just the number of PPD you are now smoking)   Years smoking 45 x Packs per day 1 = Pack years 45   (at least 20 pack yrs)   (Make sure they understand that we need to know how much they have smoked in the past, not just the number of PPD they are smoking now)  Do you have a personal history of cancer?  No    Do you have a family history of cancer? Yes  (cancer type and and relative) Maternal Grandmother Multiple Myeloma, Aunt breast, Uncle colorectal.   Are you coughing up blood?  No  Have you had unexplained weight loss of 15 lbs or more in the last 6 months? No  It looks like you meet all criteria.  When would be a good time for [place] to schedule you for this screening?   Additional information: N/A

## 2023-05-01 DIAGNOSIS — Z419 Encounter for procedure for purposes other than remedying health state, unspecified: Secondary | ICD-10-CM | POA: Diagnosis not present

## 2023-05-05 ENCOUNTER — Ambulatory Visit (INDEPENDENT_AMBULATORY_CARE_PROVIDER_SITE_OTHER): Payer: Medicaid Other | Admitting: Acute Care

## 2023-05-05 DIAGNOSIS — F17210 Nicotine dependence, cigarettes, uncomplicated: Secondary | ICD-10-CM | POA: Diagnosis not present

## 2023-05-05 NOTE — Progress Notes (Addendum)
 Provider Attestation I agree with the documentation of the Shared Decision Making visit,  smoking cessation counseling if appropriate, and verification or eligibility for lung cancer screening as documented by the RN Nurse Navigator.   Raejean Bullock, MSN, AGACNP-BC [facility] See Amion for personal pager PCCM on call pager [phone]     Virtual Visit via Telephone Note  I connected with ARNETT GALINDEZ on 05/05/23 at 11:30 AM EST by telephone and verified that I am speaking with the correct person using two identifiers.  Location: Patient: Karen Foster Provider: Alyse Bach, RN   I discussed the limitations, risks, security and privacy concerns of performing an evaluation and management service by telephone and the availability of in person appointments. I also discussed with the patient that there may be a patient responsible charge related to this service. The patient expressed understanding and agreed to proceed.   Shared Decision Making Visit Lung Cancer Screening Program [phone])   Eligibility: Age 63 y.o. Pack Years Smoking History Calculation 45 (# packs/per year x # years smoked) Recent History of coughing up blood  no Unexplained weight loss? no ( >Than 15 pounds within the last 6 months ) Prior History Lung / other cancer no (Diagnosis within the last 5 years already requiring surveillance chest CT Scans). Smoking Status Current Smoker Former Smokers: Years since quit: n/a  Quit Date: n/a  Visit Components: Discussion included one or more decision making aids. yes Discussion included risk/benefits of screening. yes Discussion included potential follow up diagnostic testing for abnormal scans. yes Discussion included meaning and risk of over diagnosis. yes Discussion included meaning and risk of False Positives. yes Discussion included meaning of total radiation exposure. yes  Counseling Included: Importance of adherence to  annual lung cancer LDCT screening. yes Impact of comorbidities on ability to participate in the program. yes Ability and willingness to under diagnostic treatment. yes  Smoking Cessation Counseling: Current Smokers:  Discussed importance of smoking cessation. yes Information about tobacco cessation classes and interventions provided to patient. yes Patient provided with "ticket" for LDCT Scan. no Symptomatic Patient. no  Counseling(Intermediate counseling: > three minutes) 99406 Diagnosis Code: Tobacco Use Z72.0 Asymptomatic Patient yes  Counseling (Intermediate counseling: > three minutes counseling) B1478 Former Smokers:  Discussed the importance of maintaining cigarette abstinence. yes Diagnosis Code: Personal History of Nicotine  Dependence. G95.621 Information about tobacco cessation classes and interventions provided to patient. Yes Patient provided with "ticket" for LDCT Scan. yes Written Order for Lung Cancer Screening with LDCT placed in Epic. Yes (CT Chest Lung Cancer Screening Low Dose W/O CM) [id number] Z12.2-Screening of respiratory organs Z87.891-Personal history of nicotine  dependence   Alyse Bach, RN

## 2023-05-05 NOTE — Patient Instructions (Signed)

## 2023-05-06 ENCOUNTER — Ambulatory Visit (HOSPITAL_BASED_OUTPATIENT_CLINIC_OR_DEPARTMENT_OTHER): Payer: Medicaid Other

## 2023-05-10 ENCOUNTER — Other Ambulatory Visit: Payer: Self-pay | Admitting: Nurse Practitioner

## 2023-05-10 DIAGNOSIS — J449 Chronic obstructive pulmonary disease, unspecified: Secondary | ICD-10-CM

## 2023-05-11 ENCOUNTER — Other Ambulatory Visit: Payer: Self-pay | Admitting: Nurse Practitioner

## 2023-05-11 DIAGNOSIS — J449 Chronic obstructive pulmonary disease, unspecified: Secondary | ICD-10-CM

## 2023-05-12 NOTE — Telephone Encounter (Signed)
 Requested Prescriptions  Pending Prescriptions Disp Refills   VENTOLIN HFA 108 (90 Base) MCG/ACT inhaler [Pharmacy Med Name: Ventolin HFA 90 mcg/actuation aerosol inhaler] 18 g 2    Sig: inhale TWO puffs into THE lungs EVERY 6 HOURS AS NEEDED FOR wheezing OR SHORTNESS OF BREATH.     Pulmonology:  Beta Agonists 2 Failed - 05/12/2023  1:09 PM      Failed - Last BP in normal range    BP Readings from Last 1 Encounters:  03/15/23 (!) 140/82         Passed - Last Heart Rate in normal range    Pulse Readings from Last 1 Encounters:  03/15/23 (!) 108         Passed - Valid encounter within last 12 months    Recent Outpatient Visits           1 month ago COPD mixed type (HCC)   [place] [facility] Larae Grooms, NP   6 months ago Annual physical exam   [place] [facility] Mecum, Oswaldo Conroy, PA-C   1 year ago COPD mixed type [facility])   [place] [facility] Larae Grooms, NP   1 year ago Cough in adult   [place] [facility] Mecum, Oswaldo Conroy, PA-C   1 year ago COPD mixed type [facility])   [place] [facility] Mecum, Oswaldo Conroy, PA-C

## 2023-05-14 ENCOUNTER — Other Ambulatory Visit: Payer: Self-pay | Admitting: Nurse Practitioner

## 2023-05-14 DIAGNOSIS — R059 Cough, unspecified: Secondary | ICD-10-CM

## 2023-05-14 NOTE — Telephone Encounter (Signed)
 Requested medication (s) are due for refill today: yes  Requested medication (s) are on the active medication list: yes  Last refill:  03/17/23 #60/1  Future visit scheduled: no  Notes to clinic:  ok to continue rx?     Requested Prescriptions  Pending Prescriptions Disp Refills   benzonatate (TESSALON) 200 MG capsule [Pharmacy Med Name: benzonatate 200 mg capsule] 60 capsule 1    Sig: TAKE ONE CAPSULE BY MOUTH TWICE DAILY AS NEEDED FOR cough.     Ear, Nose, and Throat:  Antitussives/Expectorants Passed - 05/14/2023  4:44 PM      Passed - Valid encounter within last 12 months    Recent Outpatient Visits           2 months ago COPD mixed type [facility])   [place] [facility] Larae Grooms, NP   6 months ago Annual physical exam   [place] [facility] Mecum, Oswaldo Conroy, PA-C   1 year ago COPD mixed type [facility])   [place] [facility] Larae Grooms, NP   1 year ago Cough in adult   [place] [facility] Mecum, Oswaldo Conroy, PA-C   1 year ago COPD mixed type [facility])   [facility] [facility] Mecum, Oswaldo Conroy, PA-C

## 2023-06-12 DIAGNOSIS — Z419 Encounter for procedure for purposes other than remedying health state, unspecified: Secondary | ICD-10-CM | POA: Diagnosis not present

## 2023-07-03 ENCOUNTER — Other Ambulatory Visit: Payer: Self-pay | Admitting: Nurse Practitioner

## 2023-07-03 DIAGNOSIS — J449 Chronic obstructive pulmonary disease, unspecified: Secondary | ICD-10-CM

## 2023-07-06 NOTE — Telephone Encounter (Signed)
 Requested medication (s) are due for refill today: last refill 06/08/23  Requested medication (s) are on the active medication list: yes   Last refill:  03/15/23 #10.2 g 3 refills  Future visit scheduled: no   Notes to clinic:  last OV 03/15/23. Last refill 06/08/23. Do you want to allow 5 refills?     Requested Prescriptions  Pending Prescriptions Disp Refills   SYMBICORT  160-4.5 MCG/ACT inhaler [Pharmacy Med Name: Symbicort  160 mcg-4.5 mcg/actuation HFA aerosol inhaler] 10.2 g 5    Sig: inhale TWO puffs into THE lungs IN THE MORNING AND AT BEDTIME     Pulmonology:  Combination Products Failed - 07/06/2023 11:47 AM      Failed - Valid encounter within last 12 months    Recent Outpatient Visits   None

## 2023-07-12 ENCOUNTER — Other Ambulatory Visit: Payer: Self-pay | Admitting: Nurse Practitioner

## 2023-07-12 DIAGNOSIS — Z419 Encounter for procedure for purposes other than remedying health state, unspecified: Secondary | ICD-10-CM | POA: Diagnosis not present

## 2023-07-12 DIAGNOSIS — R059 Cough, unspecified: Secondary | ICD-10-CM

## 2023-07-13 NOTE — Telephone Encounter (Signed)
 Requested medication (s) are due for refill today: routing for review  Requested medication (s) are on the active medication list: yes  Last refill:  05/17/23  Future visit scheduled: no  Notes to clinic:  routing for review     Requested Prescriptions  Pending Prescriptions Disp Refills   benzonatate  (TESSALON ) 200 MG capsule [Pharmacy Med Name: benzonatate  200 mg capsule] 60 capsule 1    Sig: TAKE ONE CAPSULE BY MOUTH TWICE DAILY AS NEEDED FOR cough.     Ear, Nose, and Throat:  Antitussives/Expectorants Failed - 07/13/2023  4:15 PM      Failed - Valid encounter within last 12 months    Recent Outpatient Visits   None

## 2023-07-14 ENCOUNTER — Telehealth: Payer: Self-pay

## 2023-07-14 DIAGNOSIS — R059 Cough, unspecified: Secondary | ICD-10-CM

## 2023-07-14 MED ORDER — BENZONATATE 200 MG PO CAPS: 200.0000 mg | ORAL_CAPSULE | Freq: Two times a day (BID) | ORAL | 1 refills | Status: DC | PRN

## 2023-07-14 NOTE — Telephone Encounter (Signed)
 Prescription was sent to the pharmacy

## 2023-07-14 NOTE — Telephone Encounter (Signed)
 Copied from CRM [id number]. Topic: Clinical - Prescription Issue >> Jul 13, 2023  5:10 PM Phil Braun wrote: Reason for CRM:   Ref: benzonatate  (TESSALON ) 200 MG capsule  Pt called to see why she was refused for refill request. I didn't see one in the system. Could you please call pt and advise.

## 2023-07-14 NOTE — Telephone Encounter (Signed)
 Routing to provider. RX is due for refill, unclear in last note when patient was to follow up.

## 2023-08-01 ENCOUNTER — Other Ambulatory Visit: Payer: Self-pay | Admitting: Nurse Practitioner

## 2023-08-01 DIAGNOSIS — J449 Chronic obstructive pulmonary disease, unspecified: Secondary | ICD-10-CM

## 2023-08-02 ENCOUNTER — Encounter: Payer: Self-pay | Admitting: Acute Care

## 2023-08-02 NOTE — Telephone Encounter (Signed)
 Requested Prescriptions  Pending Prescriptions Disp Refills   SPIRIVA  RESPIMAT 2.5 MCG/ACT AERS [Pharmacy Med Name: Spiriva  Respimat 2.5 mcg/actuation solution for inhalation] 4 g 2    Sig: inhale TWO puffs into THE lungs DAILY.     Pulmonology:  Anticholinergic Agents Failed - 08/02/2023  4:33 PM      Failed - Valid encounter within last 12 months    Recent Outpatient Visits   None

## 2023-08-03 ENCOUNTER — Encounter: Payer: Self-pay | Admitting: Nurse Practitioner

## 2023-08-03 ENCOUNTER — Ambulatory Visit (INDEPENDENT_AMBULATORY_CARE_PROVIDER_SITE_OTHER): Admitting: Nurse Practitioner

## 2023-08-03 VITALS — BP 136/79 | HR 96 | Ht 65.0 in | Wt 144.0 lb

## 2023-08-03 DIAGNOSIS — G8929 Other chronic pain: Secondary | ICD-10-CM | POA: Diagnosis not present

## 2023-08-03 DIAGNOSIS — M25552 Pain in left hip: Secondary | ICD-10-CM

## 2023-08-03 NOTE — Progress Notes (Signed)
 BP 136/79   Pulse 96   Ht 5\' 5"  (1.651 m)   Wt 144 lb (65.3 kg)   BMI 23.96 kg/m    Subjective:    Patient ID: Karen Foster, female    DOB: 03-12-1960, 63 y.o.   MRN: [id number]  HPI: Karen Foster is a 63 y.o. female  Chief Complaint  Patient presents with   Pain Management    Left hip, runs a tubing business    HIP PAIN Has had surgery on her left hip.  Having to use Goody Powder in the morning and evening.  She is also using Gabapentin  and Flexeril  regularly.  Previously seen by pain management. Duration: worse over the last 3 weeks Involved hip: left  Mechanism of injury: none Location: lateral Onset: gradual  Severity: 7/10  Quality: shooting Frequency: constant Radiation: no Aggravating factors: weight bearing, walking, and movement   Alleviating factors: Gabapentin , Flexeril  and Goody Powder  Status: worse Treatments attempted: Gabapentin , Flexeril  and Goody Powder   Relief with NSAIDs?: moderate Weakness with weight bearing: yes Weakness with walking: yes Paresthesias / decreased sensation: no Swelling: no Redness:no Fevers: no Feels like it might be a nerve pain.    Relevant past medical, surgical, family and social history reviewed and updated as indicated. Interim medical history since our last visit reviewed. Allergies and medications reviewed and updated.  Review of Systems  Musculoskeletal:        Left hip pain    Per HPI unless specifically indicated above     Objective:     BP 136/79   Pulse 96   Ht 5\' 5"  (1.651 m)   Wt 144 lb (65.3 kg)   BMI 23.96 kg/m   Wt Readings from Last 3 Encounters:  08/03/23 144 lb (65.3 kg)  03/15/23 144 lb 9.6 oz (65.6 kg)  10/27/22 151 lb (68.5 kg)    Physical Exam Vitals and nursing note reviewed.  Constitutional:      General: She is not in acute distress.    Appearance: Normal appearance. She is normal weight. She is not ill-appearing, toxic-appearing or diaphoretic.  HENT:     Head:  Normocephalic.     Right Ear: External ear normal.     Left Ear: External ear normal.     Nose: Nose normal.     Mouth/Throat:     Mouth: Mucous membranes are moist.     Pharynx: Oropharynx is clear.  Eyes:     General:        Right eye: No discharge.        Left eye: No discharge.     Extraocular Movements: Extraocular movements intact.     Conjunctiva/sclera: Conjunctivae normal.     Pupils: Pupils are equal, round, and reactive to light.  Cardiovascular:     Rate and Rhythm: Normal rate and regular rhythm.     Heart sounds: No murmur heard. Pulmonary:     Effort: Pulmonary effort is normal. No respiratory distress.     Breath sounds: Normal breath sounds. No wheezing or rales.  Musculoskeletal:     Cervical back: Normal range of motion and neck supple.  Skin:    General: Skin is warm and dry.     Capillary Refill: Capillary refill takes less than 2 seconds.  Neurological:     General: No focal deficit present.     Mental Status: She is alert and oriented to person, place, and time. Mental status is at baseline.  Psychiatric:  Mood and Affect: Mood normal.        Behavior: Behavior normal.        Thought Content: Thought content normal.        Judgment: Judgment normal.     Results for orders placed or performed in visit on 03/15/23  Lipid Profile   Collection Time: 03/15/23  2:56 PM  Result Value Ref Range   Cholesterol, Total 124 100 - 199 mg/dL   Triglycerides 161 (H) 0 - 149 mg/dL   HDL 29 (L) >09 mg/dL   VLDL Cholesterol Cal 34 5 - 40 mg/dL   LDL Chol Calc (NIH) 61 0 - 99 mg/dL   Chol/HDL Ratio 4.3 0.0 - 4.4 ratio  Hemoglobin A1c   Collection Time: 03/15/23  2:56 PM  Result Value Ref Range   Hgb A1c MFr Bld 5.9 (H) 4.8 - 5.6 %   Est. average glucose Bld gHb Est-mCnc 123 mg/dL  Comprehensive metabolic panel   Collection Time: 03/15/23  2:56 PM  Result Value Ref Range   Glucose 105 (H) 70 - 99 mg/dL   BUN 23 8 - 27 mg/dL   Creatinine, Ser 6.04 (H)  0.57 - 1.00 mg/dL   eGFR 63 >54 UJ/WJX/9.14   BUN/Creatinine Ratio 23 12 - 28   Sodium 141 134 - 144 mmol/L   Potassium 5.0 3.5 - 5.2 mmol/L   Chloride 105 96 - 106 mmol/L   CO2 20 20 - 29 mmol/L   Calcium  9.2 8.7 - 10.3 mg/dL   Total Protein 6.9 6.0 - 8.5 g/dL   Albumin 4.6 3.9 - 4.9 g/dL   Globulin, Total 2.3 1.5 - 4.5 g/dL   Bilirubin Total <7.8 0.0 - 1.2 mg/dL   Alkaline Phosphatase 87 44 - 121 IU/L   AST 18 0 - 40 IU/L   ALT 13 0 - 32 IU/L      Assessment & Plan:   Problem List Items Addressed This Visit   None Visit Diagnoses       Chronic left hip pain    -  Primary   Chronic concern. Currently on Gabapentin  and Flexeril . Using [employer].  Referral placed for pain management.   Relevant Orders   Ambulatory referral to Pain Clinic        Follow up plan: No follow-ups on file.

## 2023-08-12 DIAGNOSIS — Z419 Encounter for procedure for purposes other than remedying health state, unspecified: Secondary | ICD-10-CM | POA: Diagnosis not present

## 2023-08-13 DIAGNOSIS — Z6823 Body mass index (BMI) 23.0-23.9, adult: Secondary | ICD-10-CM | POA: Diagnosis not present

## 2023-08-13 DIAGNOSIS — Z79899 Other long term (current) drug therapy: Secondary | ICD-10-CM | POA: Diagnosis not present

## 2023-08-13 DIAGNOSIS — I10 Essential (primary) hypertension: Secondary | ICD-10-CM | POA: Diagnosis not present

## 2023-08-13 DIAGNOSIS — T8484XA Pain due to internal orthopedic prosthetic devices, implants and grafts, initial encounter: Secondary | ICD-10-CM | POA: Diagnosis not present

## 2023-08-13 DIAGNOSIS — R0602 Shortness of breath: Secondary | ICD-10-CM | POA: Diagnosis not present

## 2023-08-13 DIAGNOSIS — M129 Arthropathy, unspecified: Secondary | ICD-10-CM | POA: Diagnosis not present

## 2023-08-13 LAB — LAB REPORT - SCANNED: EGFR: 63

## 2023-08-18 DIAGNOSIS — F17210 Nicotine dependence, cigarettes, uncomplicated: Secondary | ICD-10-CM | POA: Diagnosis not present

## 2023-08-18 DIAGNOSIS — Z6823 Body mass index (BMI) 23.0-23.9, adult: Secondary | ICD-10-CM | POA: Diagnosis not present

## 2023-08-18 DIAGNOSIS — I10 Essential (primary) hypertension: Secondary | ICD-10-CM | POA: Diagnosis not present

## 2023-08-18 DIAGNOSIS — T8484XA Pain due to internal orthopedic prosthetic devices, implants and grafts, initial encounter: Secondary | ICD-10-CM | POA: Diagnosis not present

## 2023-08-18 DIAGNOSIS — Z79899 Other long term (current) drug therapy: Secondary | ICD-10-CM | POA: Diagnosis not present

## 2023-08-19 ENCOUNTER — Encounter: Payer: Self-pay | Admitting: Nurse Practitioner

## 2023-08-19 ENCOUNTER — Telehealth: Payer: Self-pay

## 2023-08-19 NOTE — Telephone Encounter (Signed)
 PCP to review and advise. Please contact patient.   Copied from CRM [id number]. Topic: Clinical - Lab/Test Results >> Aug 19, 2023  8:51 AM Baldemar Lev wrote: Reason for CRM: Pt wants to speak to the clinic about concerning lab results that she received from Pain Management.   Best contact: [phone]

## 2023-08-19 NOTE — Telephone Encounter (Unsigned)
 Copied from CRM [id number]. Topic: Clinical - Lab/Test Results >> Aug 19, 2023  8:51 AM Baldemar Lev wrote: Reason for CRM: Pt wants to speak to the clinic about concerning lab results that she received from Pain Management.   Best contact: [phone] >> Aug 19, 2023  2:36 PM Lizabeth Riggs wrote: Please call Lorie back. She has several lab results from her pain management doctor that state they are abnormal. There is too many lab results to list.

## 2023-08-19 NOTE — Telephone Encounter (Signed)
 Left message for patient to call back or send message with specific information.  If patient calls back please get details as to message.

## 2023-08-20 DIAGNOSIS — Z79899 Other long term (current) drug therapy: Secondary | ICD-10-CM | POA: Diagnosis not present

## 2023-08-24 ENCOUNTER — Other Ambulatory Visit: Payer: Self-pay | Admitting: Nurse Practitioner

## 2023-08-24 DIAGNOSIS — J449 Chronic obstructive pulmonary disease, unspecified: Secondary | ICD-10-CM

## 2023-08-25 NOTE — Telephone Encounter (Signed)
 Too soon for refill.  Requested Prescriptions  Pending Prescriptions Disp Refills   VENTOLIN  HFA 108 (90 Base) MCG/ACT inhaler [Pharmacy Med Name: Ventolin  HFA 90 mcg/actuation aerosol inhaler] 18 g 1    Sig: inhale TWO puffs into THE lungs EVERY 6 HOURS AS NEEDED FOR wheezing OR SHORTNESS OF BREATH.     Pulmonology:  Beta Agonists 2 Failed - 08/25/2023  2:30 PM      Failed - Valid encounter within last 12 months    Recent Outpatient Visits           3 weeks ago Chronic left hip pain   [place] [facility] Melvin Pao, NP              Passed - Last BP in normal range    BP Readings from Last 1 Encounters:  08/03/23 136/79         Passed - Last Heart Rate in normal range    Pulse Readings from Last 1 Encounters:  08/03/23 96

## 2023-08-26 ENCOUNTER — Other Ambulatory Visit: Payer: Self-pay | Admitting: Nurse Practitioner

## 2023-08-26 DIAGNOSIS — R059 Cough, unspecified: Secondary | ICD-10-CM

## 2023-08-27 NOTE — Telephone Encounter (Signed)
 OV 03/15/23, Rx 07/14/23 #60 1RF-too soon Requested Prescriptions  Pending Prescriptions Disp Refills   benzonatate  (TESSALON ) 200 MG capsule [Pharmacy Med Name: benzonatate  200 mg capsule] 60 capsule 1    Sig: TAKE ONE CAPSULE BY MOUTH TWICE DAILY AS NEEDED FOR cough     Ear, Nose, and Throat:  Antitussives/Expectorants Failed - 08/27/2023  2:56 PM      Failed - Valid encounter within last 12 months    Recent Outpatient Visits           3 weeks ago Chronic left hip pain   [place] [facility] Melvin Pao, NP

## 2023-08-31 ENCOUNTER — Other Ambulatory Visit: Payer: Self-pay | Admitting: Nurse Practitioner

## 2023-08-31 DIAGNOSIS — J449 Chronic obstructive pulmonary disease, unspecified: Secondary | ICD-10-CM

## 2023-09-02 ENCOUNTER — Other Ambulatory Visit: Payer: Self-pay | Admitting: Nurse Practitioner

## 2023-09-02 DIAGNOSIS — R000 Tachycardia, unspecified: Secondary | ICD-10-CM

## 2023-09-02 DIAGNOSIS — R059 Cough, unspecified: Secondary | ICD-10-CM

## 2023-09-02 DIAGNOSIS — I341 Nonrheumatic mitral (valve) prolapse: Secondary | ICD-10-CM

## 2023-09-02 DIAGNOSIS — J449 Chronic obstructive pulmonary disease, unspecified: Secondary | ICD-10-CM

## 2023-09-02 DIAGNOSIS — F332 Major depressive disorder, recurrent severe without psychotic features: Secondary | ICD-10-CM

## 2023-09-02 NOTE — Telephone Encounter (Signed)
 Duplicate,too soon for RF.  Requested Prescriptions  Pending Prescriptions Disp Refills   VENTOLIN  HFA 108 (90 Base) MCG/ACT inhaler [Pharmacy Med Name: Ventolin  HFA 90 mcg/actuation aerosol inhaler] 18 g 4    Sig: inhale TWO puffs into THE lungs EVERY 6 HOURS AS NEEDED FOR wheezing OR SHORTNESS OF BREATH.     Pulmonology:  Beta Agonists 2 Passed - 09/02/2023 12:21 PM      Passed - Last BP in normal range    BP Readings from Last 1 Encounters:  08/03/23 136/79         Passed - Last Heart Rate in normal range    Pulse Readings from Last 1 Encounters:  08/03/23 96         Passed - Valid encounter within last 12 months    Recent Outpatient Visits           1 month ago Chronic left hip pain   [place] [facility] Melvin Pao, NP

## 2023-09-06 NOTE — Telephone Encounter (Signed)
 Requested Prescriptions  Pending Prescriptions Disp Refills   rosuvastatin  (CRESTOR ) 10 MG tablet [Pharmacy Med Name: rosuvastatin  10 mg tablet] 90 tablet 0    Sig: TAKE ONE TABLET BY MOUTH DAILY     Cardiovascular:  Antilipid - Statins 2 Failed - 09/06/2023  2:20 PM      Failed - Cr in normal range and within 360 days    Creatinine, Ser  Date Value Ref Range Status  03/15/2023 1.02 (H) 0.57 - 1.00 mg/dL Final         Failed - Lipid Panel in normal range within the last 12 months    Cholesterol, Total  Date Value Ref Range Status  03/15/2023 124 100 - 199 mg/dL Final   LDL Chol Calc (NIH)  Date Value Ref Range Status  03/15/2023 61 0 - 99 mg/dL Final   HDL  Date Value Ref Range Status  03/15/2023 29 (L) >39 mg/dL Final   Triglycerides  Date Value Ref Range Status  03/15/2023 205 (H) 0 - 149 mg/dL Final         Passed - Patient is not pregnant      Passed - Valid encounter within last 12 months    Recent Outpatient Visits           1 month ago Chronic left hip pain   [place] [facility] Kincaid, Darice, NP               montelukast  (SINGULAIR ) 10 MG tablet [Pharmacy Med Name: montelukast  10 mg tablet] 90 tablet 0    Sig: TAKE ONE TABLET BY MOUTH AT BEDTIME     Pulmonology:  Leukotriene Inhibitors Passed - 09/06/2023  2:20 PM      Passed - Valid encounter within last 12 months    Recent Outpatient Visits           1 month ago Chronic left hip pain   [place] [facility] Melvin Darice, NP               metoprolol  tartrate (LOPRESSOR ) 25 MG tablet [Pharmacy Med Name: metoprolol  tartrate 25 mg tablet] 180 tablet 0    Sig: TAKE ONE TABLET BY MOUTH TWICE DAILY     Cardiovascular:  Beta Blockers Passed - 09/06/2023  2:20 PM      Passed - Last BP in normal range    BP Readings from Last 1 Encounters:  08/03/23 136/79         Passed - Last Heart Rate in normal range    Pulse Readings from Last 1 Encounters:  08/03/23  96         Passed - Valid encounter within last 6 months    Recent Outpatient Visits           1 month ago Chronic left hip pain   [place] [facility] Melvin Darice, NP               DULoxetine  (CYMBALTA ) 20 MG capsule [Pharmacy Med Name: duloxetine  20 mg capsule,delayed release] 90 capsule 0    Sig: TAKE ONE CAPSULE BY MOUTH DAILY     Psychiatry: Antidepressants - SNRI - duloxetine  Failed - 09/06/2023  2:20 PM      Failed - Cr in normal range and within 360 days    Creatinine, Ser  Date Value Ref Range Status  03/15/2023 1.02 (H) 0.57 - 1.00 mg/dL Final         Passed - eGFR is 30  or above and within 360 days    GFR calc Af Amer  Date Value Ref Range Status  02/18/2018 94 >59 mL/min/1.73 Final   GFR calc non Af Amer  Date Value Ref Range Status  02/18/2018 81 >59 mL/min/1.73 Final   eGFR  Date Value Ref Range Status  03/15/2023 63 >59 mL/min/1.73 Final   EGFR  Date Value Ref Range Status  08/13/2023 63.0  Final    Comment:    Abstracted by HIM         Passed - Completed PHQ-2 or PHQ-9 in the last 360 days      Passed - Last BP in normal range    BP Readings from Last 1 Encounters:  08/03/23 136/79         Passed - Valid encounter within last 6 months    Recent Outpatient Visits           1 month ago Chronic left hip pain   [place] [facility] Melvin Pao, NP

## 2023-09-08 ENCOUNTER — Other Ambulatory Visit: Payer: Self-pay | Admitting: Nurse Practitioner

## 2023-09-08 DIAGNOSIS — J449 Chronic obstructive pulmonary disease, unspecified: Secondary | ICD-10-CM

## 2023-09-10 NOTE — Telephone Encounter (Signed)
 Requested Prescriptions  Pending Prescriptions Disp Refills   RESTASIS 0.05 % ophthalmic emulsion [Pharmacy Med Name: Restasis 0.05 % eye drops in a dropperette] 180 each 5    Sig: place ONE drop IN EACH EYE TWICE DAILY     Not Delegated - Immunology:  Immunosuppressive Agents - cyclosporine (Ocular) Failed - 09/10/2023 11:22 AM      Failed - This refill cannot be delegated      Passed - Valid encounter within last 12 months    Recent Outpatient Visits           1 month ago Chronic left hip pain   [place] [facility] Melvin Pao, NP               VENTOLIN  HFA 108 (90 Base) MCG/ACT inhaler [Pharmacy Med Name: Ventolin  HFA 90 mcg/actuation aerosol inhaler] 18 g 2    Sig: inhale TWO puffs into THE lungs EVERY 6 HOURS AS NEEDED FOR wheezing OR SHORTNESS OF BREATH.     Pulmonology:  Beta Agonists 2 Passed - 09/10/2023 11:22 AM      Passed - Last BP in normal range    BP Readings from Last 1 Encounters:  08/03/23 136/79         Passed - Last Heart Rate in normal range    Pulse Readings from Last 1 Encounters:  08/03/23 96         Passed - Valid encounter within last 12 months    Recent Outpatient Visits           1 month ago Chronic left hip pain   [place] [facility] Melvin Pao, NP

## 2023-09-10 NOTE — Telephone Encounter (Signed)
 Requested medications are due for refill today.  unsure  Requested medications are on the active medications list.  no  Last refill. 09/07/2022  Future visit scheduled.   no  Notes to clinic.  Refill not delegated.    Requested Prescriptions  Pending Prescriptions Disp Refills   RESTASIS 0.05 % ophthalmic emulsion [Pharmacy Med Name: Restasis 0.05 % eye drops in a dropperette] 180 each 5    Sig: place ONE drop IN EACH EYE TWICE DAILY     Not Delegated - Immunology:  Immunosuppressive Agents - cyclosporine (Ocular) Failed - 09/10/2023 11:23 AM      Failed - This refill cannot be delegated      Passed - Valid encounter within last 12 months    Recent Outpatient Visits           1 month ago Chronic left hip pain   [place] [facility] Melvin Pao, NP              Signed Prescriptions Disp Refills   VENTOLIN  HFA 108 (90 Base) MCG/ACT inhaler 18 g 2    Sig: inhale TWO puffs into THE lungs EVERY 6 HOURS AS NEEDED FOR wheezing OR SHORTNESS OF BREATH.     Pulmonology:  Beta Agonists 2 Passed - 09/10/2023 11:23 AM      Passed - Last BP in normal range    BP Readings from Last 1 Encounters:  08/03/23 136/79         Passed - Last Heart Rate in normal range    Pulse Readings from Last 1 Encounters:  08/03/23 96         Passed - Valid encounter within last 12 months    Recent Outpatient Visits           1 month ago Chronic left hip pain   [place] [facility] Melvin Pao, NP

## 2023-09-11 DIAGNOSIS — Z419 Encounter for procedure for purposes other than remedying health state, unspecified: Secondary | ICD-10-CM | POA: Diagnosis not present

## 2023-09-14 DIAGNOSIS — Z6824 Body mass index (BMI) 24.0-24.9, adult: Secondary | ICD-10-CM | POA: Diagnosis not present

## 2023-09-14 DIAGNOSIS — T8484XA Pain due to internal orthopedic prosthetic devices, implants and grafts, initial encounter: Secondary | ICD-10-CM | POA: Diagnosis not present

## 2023-09-14 DIAGNOSIS — I10 Essential (primary) hypertension: Secondary | ICD-10-CM | POA: Diagnosis not present

## 2023-09-14 DIAGNOSIS — Z79899 Other long term (current) drug therapy: Secondary | ICD-10-CM | POA: Diagnosis not present

## 2023-10-14 DIAGNOSIS — I10 Essential (primary) hypertension: Secondary | ICD-10-CM | POA: Diagnosis not present

## 2023-10-14 DIAGNOSIS — Z6824 Body mass index (BMI) 24.0-24.9, adult: Secondary | ICD-10-CM | POA: Diagnosis not present

## 2023-10-14 DIAGNOSIS — T402X5A Adverse effect of other opioids, initial encounter: Secondary | ICD-10-CM | POA: Diagnosis not present

## 2023-10-14 DIAGNOSIS — Z79899 Other long term (current) drug therapy: Secondary | ICD-10-CM | POA: Diagnosis not present

## 2023-10-14 DIAGNOSIS — T8484XA Pain due to internal orthopedic prosthetic devices, implants and grafts, initial encounter: Secondary | ICD-10-CM | POA: Diagnosis not present

## 2023-10-14 DIAGNOSIS — K5903 Drug induced constipation: Secondary | ICD-10-CM | POA: Diagnosis not present

## 2023-10-19 ENCOUNTER — Other Ambulatory Visit: Payer: Self-pay | Admitting: Nurse Practitioner

## 2023-10-19 DIAGNOSIS — R059 Cough, unspecified: Secondary | ICD-10-CM

## 2023-10-20 NOTE — Telephone Encounter (Signed)
 Requested Prescriptions  Pending Prescriptions Disp Refills   benzonatate  (TESSALON ) 200 MG capsule [Pharmacy Med Name: benzonatate  200 mg capsule] 60 capsule 1    Sig: TAKE ONE CAPSULE BY MOUTH TWICE DAILY AS NEEDED FOR cough     Ear, Nose, and Throat:  Antitussives/Expectorants Passed - 10/20/2023  3:28 PM      Passed - Valid encounter within last 12 months    Recent Outpatient Visits           2 months ago Chronic left hip pain   [place] [facility] Melvin Pao, NP

## 2023-10-24 ENCOUNTER — Other Ambulatory Visit: Payer: Self-pay | Admitting: Nurse Practitioner

## 2023-10-24 DIAGNOSIS — J449 Chronic obstructive pulmonary disease, unspecified: Secondary | ICD-10-CM

## 2023-10-26 NOTE — Telephone Encounter (Signed)
 Requested Prescriptions  Pending Prescriptions Disp Refills   SPIRIVA  RESPIMAT 2.5 MCG/ACT AERS [Pharmacy Med Name: Spiriva  Respimat 2.5 mcg/actuation solution for inhalation] 4 g 2    Sig: inhale TWO puffs into THE lungs DAILY.     Pulmonology:  Anticholinergic Agents Passed - 10/26/2023  7:43 AM      Passed - Valid encounter within last 12 months    Recent Outpatient Visits           2 months ago Chronic left hip pain   [place] [facility] Melvin Pao, NP

## 2023-10-29 ENCOUNTER — Other Ambulatory Visit: Payer: Self-pay | Admitting: Nurse Practitioner

## 2023-10-29 DIAGNOSIS — K219 Gastro-esophageal reflux disease without esophagitis: Secondary | ICD-10-CM

## 2023-10-29 NOTE — Telephone Encounter (Signed)
 Copied from CRM [id number]. Topic: Clinical - Medication Refill >> Oct 29, 2023  4:02 PM Wess RAMAN wrote: Medication: pantoprazole  (PROTONIX ) 20 MG tablet   Has the patient contacted their pharmacy? Yes (Agent: If no, request that the patient contact the pharmacy for the refill. If patient does not wish to contact the pharmacy document the reason why and proceed with request.) (Agent: If yes, when and what did the pharmacy advise?)  This is the patient's preferred pharmacy:  [facility], [place] - [address] [place] [address] Phone: [phone] Fax: [phone]  Is this the correct pharmacy for this prescription? Yes If no, delete pharmacy and type the correct one.   Has the prescription been filled recently? Yes  Is the patient out of the medication? No  Has the patient been seen for an appointment in the last year OR does the patient have an upcoming appointment? Yes  Can we respond through MyChart? Yes  Agent: Please be advised that Rx refills may take up to 3 business days. We ask that you follow-up with your pharmacy.

## 2023-10-30 ENCOUNTER — Other Ambulatory Visit: Payer: Self-pay | Admitting: Nurse Practitioner

## 2023-10-30 DIAGNOSIS — K219 Gastro-esophageal reflux disease without esophagitis: Secondary | ICD-10-CM

## 2023-11-01 MED ORDER — PANTOPRAZOLE SODIUM 20 MG PO TBEC
20.0000 mg | DELAYED_RELEASE_TABLET | Freq: Two times a day (BID) | ORAL | 0 refills | Status: AC
Start: 2023-11-01 — End: ?

## 2023-11-01 NOTE — Telephone Encounter (Signed)
 Requested Prescriptions  Pending Prescriptions Disp Refills   pantoprazole  (PROTONIX ) 20 MG tablet 180 tablet 0    Sig: Take 1 tablet (20 mg total) by mouth 2 (two) times daily.     Gastroenterology: Proton Pump Inhibitors Passed - 11/01/2023  6:37 AM      Passed - Valid encounter within last 12 months    Recent Outpatient Visits           3 months ago Chronic left hip pain   [place] [facility] Melvin Pao, NP

## 2023-11-01 NOTE — Telephone Encounter (Signed)
 Duplicate request, refilled 11/01/23.  Requested Prescriptions  Pending Prescriptions Disp Refills   pantoprazole  (PROTONIX ) 20 MG tablet [Pharmacy Med Name: pantoprazole  20 mg tablet,delayed release] 180 tablet 1    Sig: TAKE ONE TABLET BY MOUTH TWICE DAILY     Gastroenterology: Proton Pump Inhibitors Passed - 11/01/2023 11:42 AM      Passed - Valid encounter within last 12 months    Recent Outpatient Visits           3 months ago Chronic left hip pain   [place] [facility] Melvin Pao, NP

## 2023-11-02 IMAGING — CR DG CHEST 2V
1 series · 2 of 2 positions shown · non-contrast
Comparison: Chest radiograph 02/07/2019

CLINICAL DATA: Shortness of breath.  Cough for 3 weeks

EXAM:
CHEST - 2 VIEW

[Series 1: dg chest 2 view · 0.14mm/px · 2 of 2 slices shown]
[im 1/2]
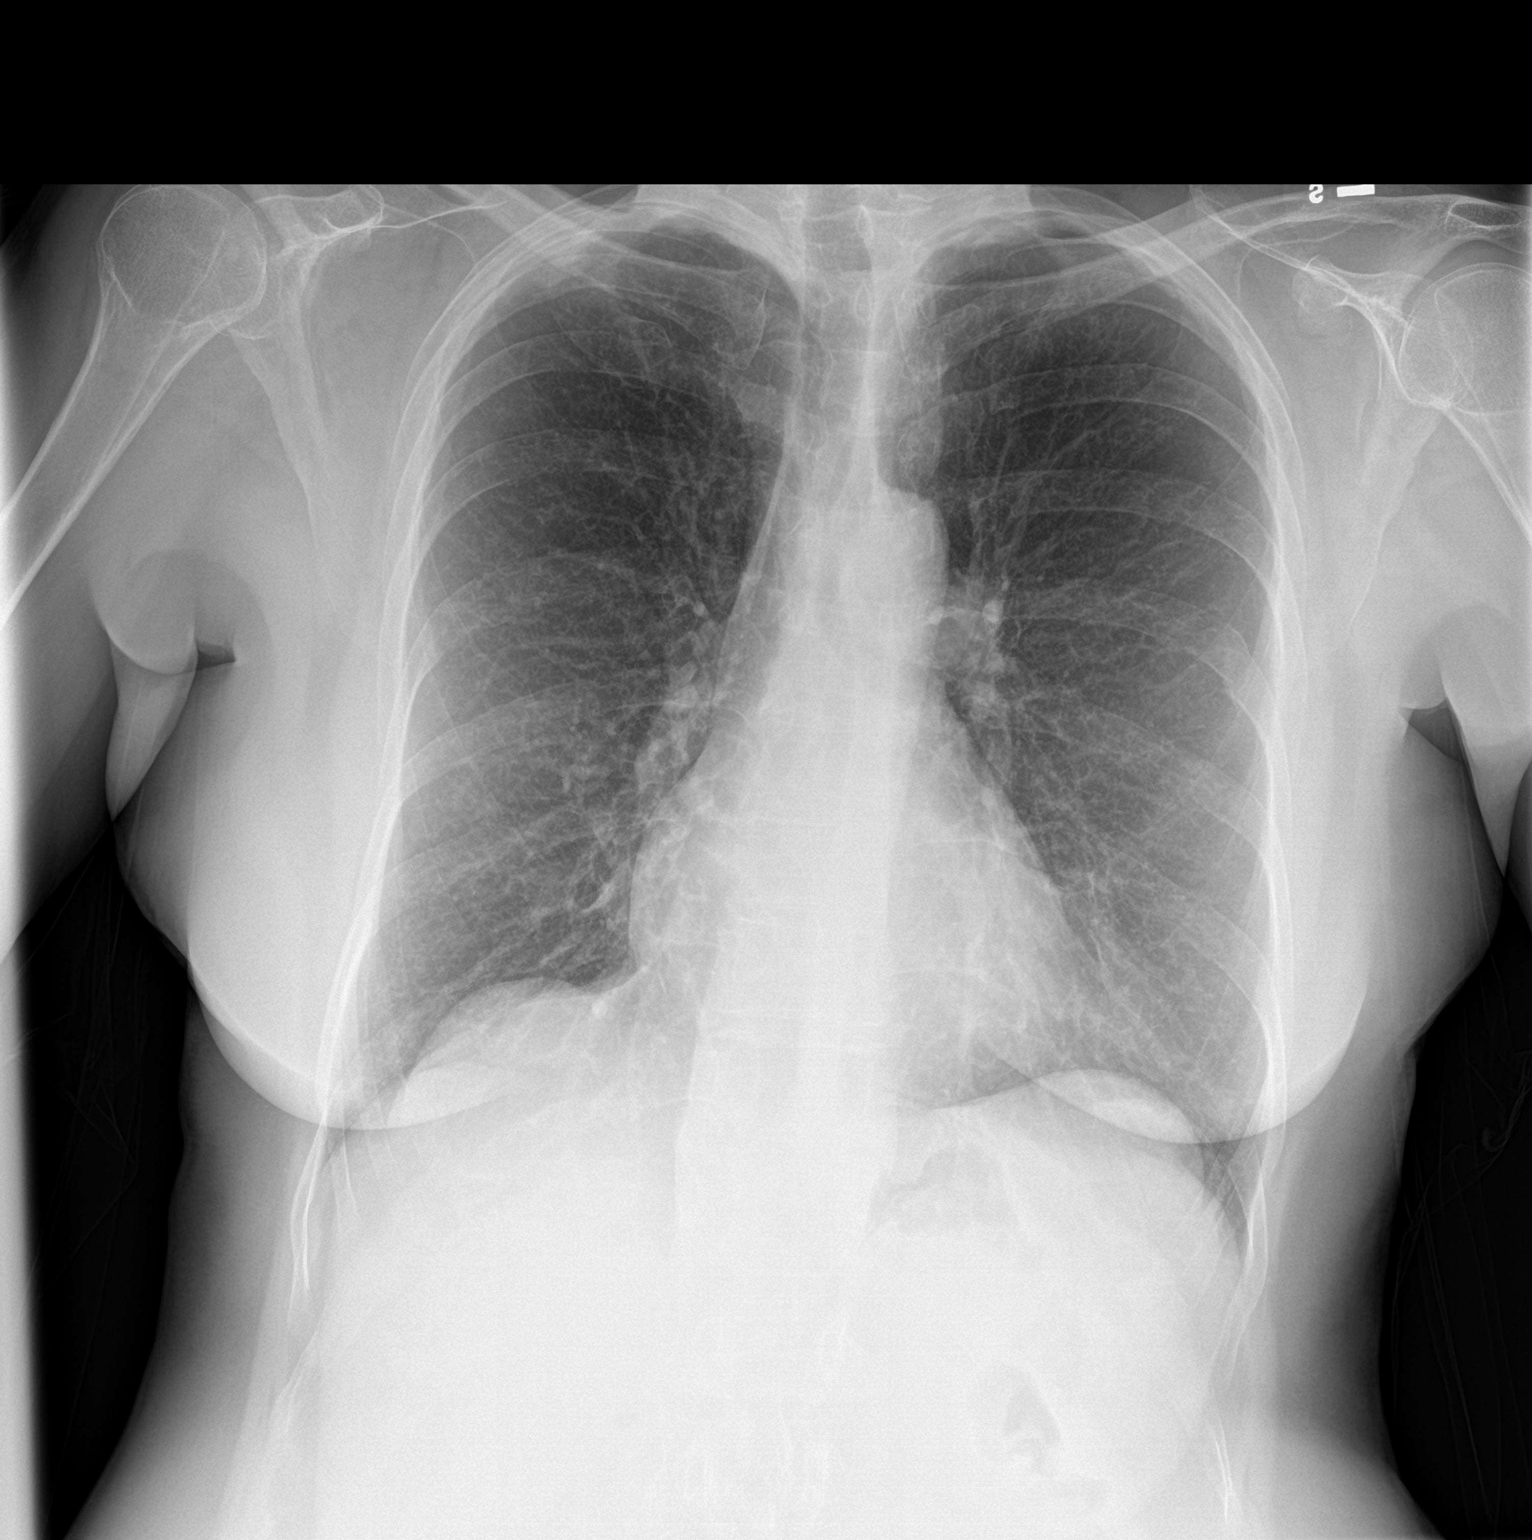
[im 2/2]
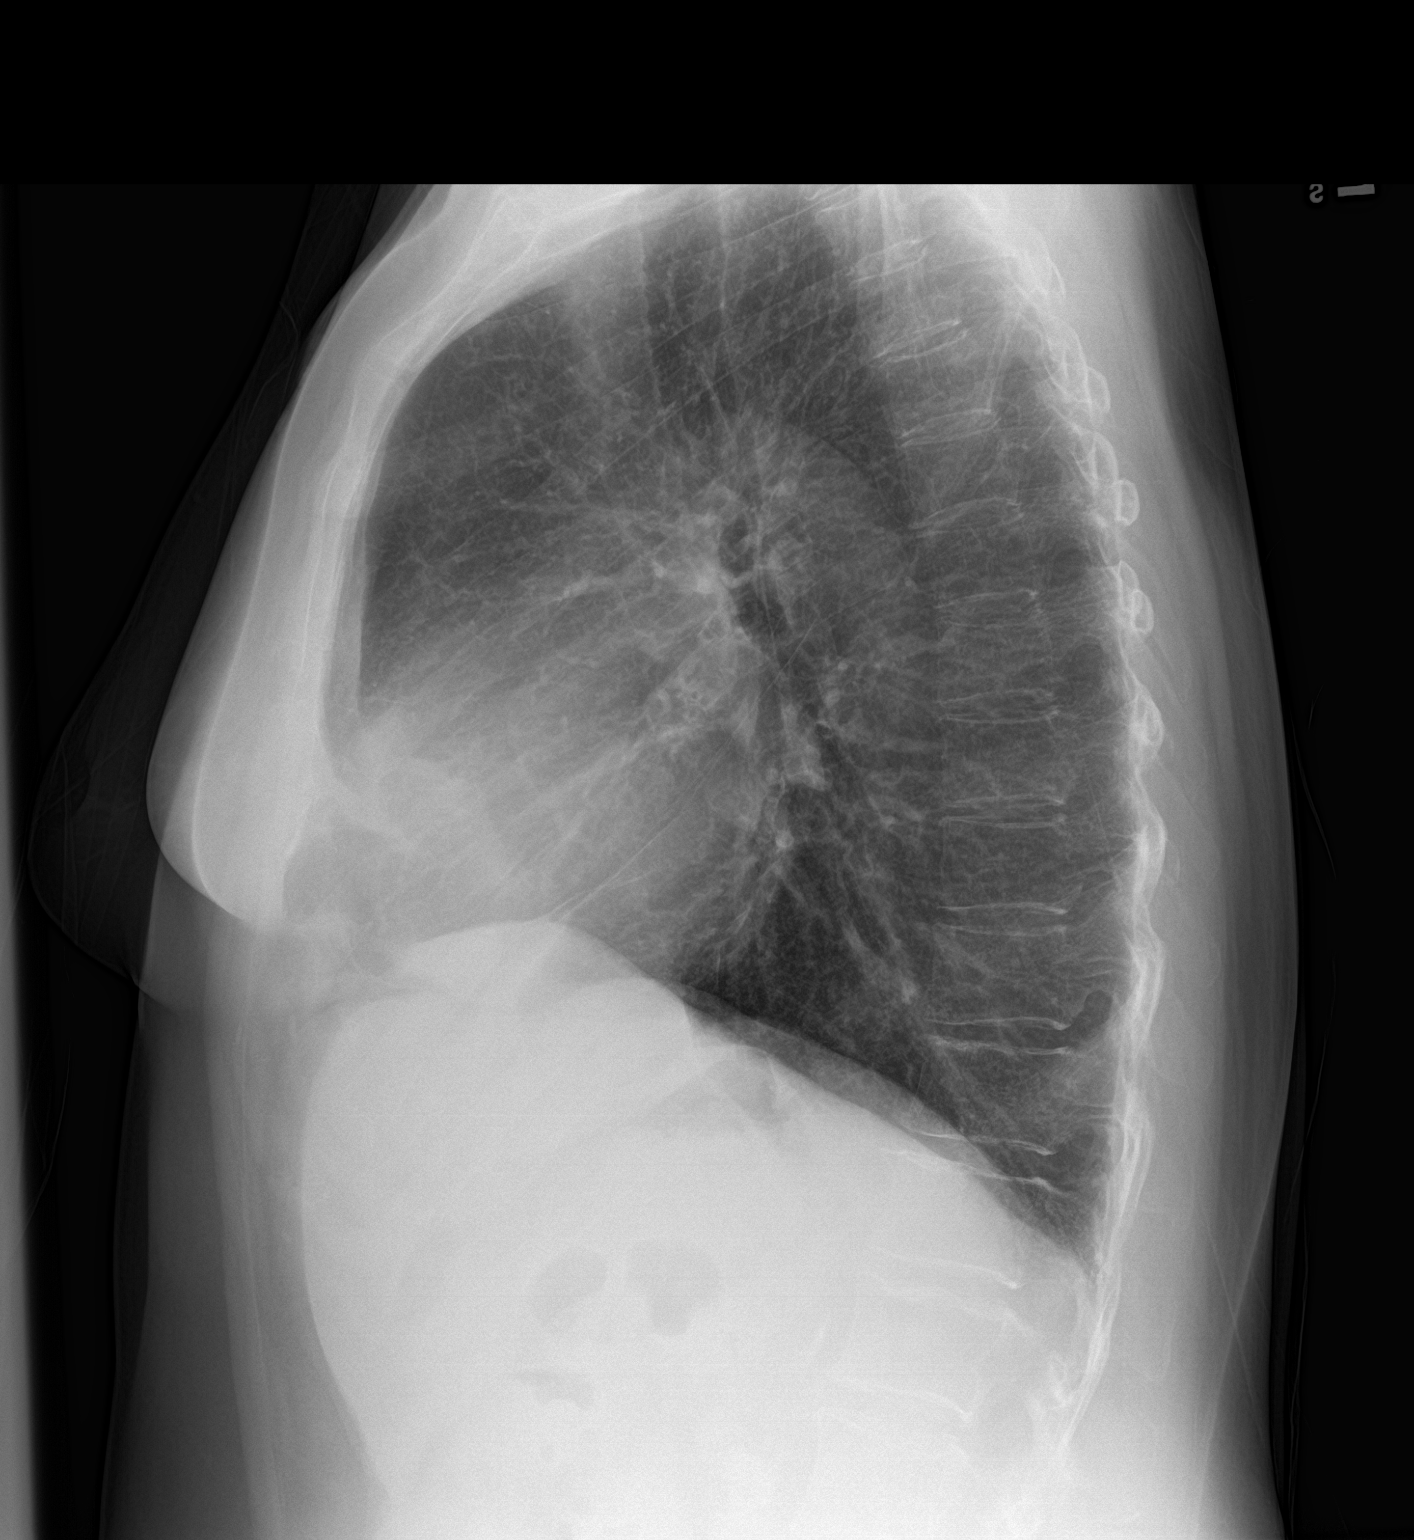

[2 of 2 positions shown; findings below may reference images not displayed]

FINDINGS: Stable cardiac and mediastinal contours. No consolidative pulmonary
opacities. No pleural effusion or pneumothorax. Chronic left
posterolateral rib fractures.
IMPRESSION: No active cardiopulmonary disease.

## 2023-12-06 ENCOUNTER — Telehealth: Payer: Self-pay

## 2023-12-06 NOTE — Telephone Encounter (Signed)
 Ok for E2C2 to review.  Called to see if patient would be open to the mobile mammogram bus coming to our office on 12/24/2023 or 01/31/2024. If patient is open to either of these please, ask a preferred time and we will do our best to accommodate and let patient know when she is scheduled.   If she is not able to make either of these days but does want to proceed with scheduling her overdue mammogram please let patient know she will be contacted for scheduling and if she has a mychart directions will be sent as well for how to self schedule.

## 2023-12-07 ENCOUNTER — Other Ambulatory Visit: Payer: Self-pay

## 2024-01-10 ENCOUNTER — Other Ambulatory Visit: Payer: Self-pay | Admitting: Nurse Practitioner

## 2024-01-10 DIAGNOSIS — J449 Chronic obstructive pulmonary disease, unspecified: Secondary | ICD-10-CM

## 2024-01-11 NOTE — Telephone Encounter (Signed)
 Requested Prescriptions  Pending Prescriptions Disp Refills   Tiotropium Bromide  (SPIRIVA  RESPIMAT) 2.5 MCG/ACT AERS [Pharmacy Med Name: Spiriva  Respimat 2.5 mcg/actuation solution for inhalation] 4 g 2    Sig: inhale TWO puffs into THE lungs DAILY.     Pulmonology:  Anticholinergic Agents Passed - 01/11/2024  2:37 PM      Passed - Valid encounter within last 12 months    Recent Outpatient Visits           5 months ago Chronic left hip pain   [place] [facility] Melvin Pao, NP

## 2024-01-31 ENCOUNTER — Inpatient Hospital Stay: Admission: RE | Admit: 2024-01-31 | Source: Ambulatory Visit

## 2024-02-07 ENCOUNTER — Other Ambulatory Visit: Payer: Self-pay | Admitting: Nurse Practitioner

## 2024-02-09 NOTE — Telephone Encounter (Signed)
 Requested Prescriptions  Pending Prescriptions Disp Refills   rosuvastatin  (CRESTOR ) 10 MG tablet [Pharmacy Med Name: rosuvastatin  10 mg tablet] 90 tablet 5    Sig: TAKE ONE TABLET BY MOUTH DAILY     Cardiovascular:  Antilipid - Statins 2 Failed - 02/09/2024  1:57 PM      Failed - Cr in normal range and within 360 days    Creatinine, Ser  Date Value Ref Range Status  03/15/2023 1.02 (H) 0.57 - 1.00 mg/dL Final         Failed - Lipid Panel in normal range within the last 12 months    Cholesterol, Total  Date Value Ref Range Status  03/15/2023 124 100 - 199 mg/dL Final   LDL Chol Calc (NIH)  Date Value Ref Range Status  03/15/2023 61 0 - 99 mg/dL Final   HDL  Date Value Ref Range Status  03/15/2023 29 (L) >39 mg/dL Final   Triglycerides  Date Value Ref Range Status  03/15/2023 205 (H) 0 - 149 mg/dL Final         Passed - Patient is not pregnant      Passed - Valid encounter within last 12 months    Recent Outpatient Visits           6 months ago Chronic left hip pain   [place] [facility] Melvin Pao, NP

## 2024-03-22 ENCOUNTER — Ambulatory Visit: Payer: Self-pay | Admitting: Nurse Practitioner

## 2024-03-22 DIAGNOSIS — R059 Cough, unspecified: Secondary | ICD-10-CM

## 2024-03-22 DIAGNOSIS — J449 Chronic obstructive pulmonary disease, unspecified: Secondary | ICD-10-CM

## 2024-03-22 NOTE — Telephone Encounter (Signed)
 Returned call to patient. She understands she needs the appointment but is still requesting the pearls stating she has used those and they might at least help overnight.

## 2024-03-22 NOTE — Telephone Encounter (Signed)
 Pt reports having an slightly worsening chronic cough secondary to COPD. Pt is requesting a refill of the following: Tessalon  Perles, Guaifenesin -codeine . Pt also wondering if she can get a Z-Pak. Pt states she lives in [place] where it is snowing and won't be able to physically come in office until next week. Please call her back and advise on prescription updates.    FYI Only or Action Required?: Action required by provider: medication refill request.  Patient was last seen in primary care on 08/03/2023 by Melvin Pao, NP.  Called Nurse Triage reporting Cough.  Symptoms began chronic symptoms.  Interventions attempted: Prescription medications: albuterol , spireva.  Symptoms are: gradually worsening.  Triage Disposition: See PCP Within 2 Weeks  Patient/caregiver understands and will follow disposition?: No, refuses disposition  Reason for Triage: Patient states she has a deep cough, real bad congestion, nausea and difficulty breathing. Inhalers are not helping.  Reason for Disposition  Cough lasts > 3 weeks  Answer Assessment - Initial Assessment Questions 1. ONSET: When did the cough begin?      It's an all the time thing, I have COPD.   2. SEVERITY: How bad is the cough today?      Real bad, reports her daily inhalers and medications are helping.   3. SPUTUM: Describe the color of your sputum (e.g., none, dry cough; clear, white, yellow, green)     Clear  4. HEMOPTYSIS: Are you coughing up any blood? If so ask: How much? (e.g., flecks, streaks, tablespoons, etc.)     Denies  5. DIFFICULTY BREATHING: Are you having difficulty breathing? If Yes, ask: How bad is it? (e.g., mild, moderate, severe)      Denies  6. FEVER: Do you have a fever? If Yes, ask: What is your temperature, how was it measured, and when did it start?     Denies   8. LUNG HISTORY: Do you have any history of lung disease?  (e.g., pulmonary embolus, asthma, emphysema)      COPD  10. OTHER SYMPTOMS: Do you have any other symptoms? (e.g., runny nose, wheezing, chest pain)       Denies  Protocols used: Cough - Chronic-A-AH

## 2024-03-22 NOTE — Telephone Encounter (Signed)
 Pt reports having an slightly worsening chronic cough secondary to COPD. Pt is requesting a refill of the following: Tessalon  Perles, Guaifenesin -codeine . Pt also wondering if she can get a Z-Pak. Pt states she lives in [place] where it is snowing and won't be able to physically come in office until next week. Please call her back and advise on prescription updates.

## 2024-03-23 MED ORDER — BENZONATATE 200 MG PO CAPS: 200.0000 mg | ORAL_CAPSULE | Freq: Two times a day (BID) | ORAL | 1 refills | Status: AC | PRN

## 2024-03-23 NOTE — Telephone Encounter (Signed)
 Tessalon  sent in for patient.

## 2024-03-23 NOTE — Telephone Encounter (Signed)
 Patient notified

## 2024-03-23 NOTE — Telephone Encounter (Unsigned)
 Copied from CRM #[id number]. Topic: Clinical - Red Word Triage >> Mar 22, 2024  8:55 AM Karen Foster wrote: Red Word that prompted transfer to Nurse Triage: nausea, difficulty breathing  Please warm transfer Red Word call to Nurse Triage and then click Send Message and Close CRM. >> Mar 22, 2024 10:59 AM Karen Foster wrote: Calling back to follow up >> Mar 22, 2024  8:59 AM Karen Foster wrote: Reason for Triage: Patient states she has a deep cough, real bad congestion, nausea and difficulty breathing. Inhalers are not helping.
